# Patient Record
Sex: Female | Born: 1950
Health system: Southern US, Community
[De-identification: ages and names within clinical notes are randomized; demographics above are authoritative.]

## PROBLEM LIST (undated history)

## (undated) DIAGNOSIS — M1990 Unspecified osteoarthritis, unspecified site: Secondary | ICD-10-CM

## (undated) DIAGNOSIS — C3490 Malignant neoplasm of unspecified part of unspecified bronchus or lung: Secondary | ICD-10-CM

## (undated) HISTORY — DX: Unspecified osteoarthritis, unspecified site: M19.90

## (undated) MED FILL — Trilaciclib Dihydrochloride For IV Soln 300 MG: INTRAVENOUS | Qty: 24 | Status: AC

---

## 1998-06-12 ENCOUNTER — Other Ambulatory Visit: Admission: RE | Admit: 1998-06-12 | Discharge: 1998-06-12 | Payer: Self-pay | Admitting: Obstetrics and Gynecology

## 1999-07-02 ENCOUNTER — Other Ambulatory Visit: Admission: RE | Admit: 1999-07-02 | Discharge: 1999-07-02 | Payer: Self-pay | Admitting: Obstetrics and Gynecology

## 1999-07-18 ENCOUNTER — Encounter: Payer: Self-pay | Admitting: Obstetrics and Gynecology

## 1999-07-18 ENCOUNTER — Encounter: Admission: RE | Admit: 1999-07-18 | Discharge: 1999-07-18 | Payer: Self-pay | Admitting: Obstetrics and Gynecology

## 2000-07-07 ENCOUNTER — Other Ambulatory Visit: Admission: RE | Admit: 2000-07-07 | Discharge: 2000-07-07 | Payer: Self-pay | Admitting: Obstetrics and Gynecology

## 2000-07-30 ENCOUNTER — Encounter: Admission: RE | Admit: 2000-07-30 | Discharge: 2000-07-30 | Payer: Self-pay | Admitting: Obstetrics and Gynecology

## 2000-07-30 ENCOUNTER — Encounter: Payer: Self-pay | Admitting: Obstetrics and Gynecology

## 2001-02-11 ENCOUNTER — Encounter: Admission: RE | Admit: 2001-02-11 | Discharge: 2001-02-11 | Payer: Self-pay | Admitting: *Deleted

## 2001-02-11 ENCOUNTER — Encounter: Payer: Self-pay | Admitting: *Deleted

## 2001-08-11 ENCOUNTER — Encounter: Payer: Self-pay | Admitting: Obstetrics and Gynecology

## 2001-08-11 ENCOUNTER — Encounter: Admission: RE | Admit: 2001-08-11 | Discharge: 2001-08-11 | Payer: Self-pay | Admitting: Obstetrics and Gynecology

## 2002-08-13 ENCOUNTER — Encounter: Admission: RE | Admit: 2002-08-13 | Discharge: 2002-08-13 | Payer: Self-pay | Admitting: Obstetrics and Gynecology

## 2002-08-13 ENCOUNTER — Encounter: Payer: Self-pay | Admitting: Obstetrics and Gynecology

## 2003-08-17 ENCOUNTER — Ambulatory Visit (HOSPITAL_COMMUNITY): Admission: RE | Admit: 2003-08-17 | Discharge: 2003-08-17 | Payer: Self-pay | Admitting: Obstetrics and Gynecology

## 2004-08-22 ENCOUNTER — Ambulatory Visit (HOSPITAL_COMMUNITY): Admission: RE | Admit: 2004-08-22 | Discharge: 2004-08-22 | Payer: Self-pay | Admitting: Obstetrics and Gynecology

## 2005-08-27 ENCOUNTER — Ambulatory Visit (HOSPITAL_COMMUNITY): Admission: RE | Admit: 2005-08-27 | Discharge: 2005-08-27 | Payer: Self-pay | Admitting: Family Medicine

## 2006-09-22 ENCOUNTER — Encounter: Admission: RE | Admit: 2006-09-22 | Discharge: 2006-09-22 | Payer: Self-pay | Admitting: Family Medicine

## 2006-10-08 ENCOUNTER — Encounter (INDEPENDENT_AMBULATORY_CARE_PROVIDER_SITE_OTHER): Payer: Self-pay | Admitting: *Deleted

## 2007-05-20 ENCOUNTER — Encounter: Payer: Self-pay | Admitting: Family Medicine

## 2007-10-12 ENCOUNTER — Encounter: Admission: RE | Admit: 2007-10-12 | Discharge: 2007-10-12 | Payer: Self-pay | Admitting: Obstetrics and Gynecology

## 2016-03-18 ENCOUNTER — Encounter: Payer: Self-pay | Admitting: Podiatry

## 2016-03-18 ENCOUNTER — Ambulatory Visit (INDEPENDENT_AMBULATORY_CARE_PROVIDER_SITE_OTHER): Payer: 59 | Admitting: Podiatry

## 2016-03-18 DIAGNOSIS — M2042 Other hammer toe(s) (acquired), left foot: Secondary | ICD-10-CM

## 2016-03-18 DIAGNOSIS — L851 Acquired keratosis [keratoderma] palmaris et plantaris: Secondary | ICD-10-CM | POA: Diagnosis not present

## 2016-03-18 DIAGNOSIS — M79672 Pain in left foot: Secondary | ICD-10-CM | POA: Diagnosis not present

## 2016-03-18 DIAGNOSIS — L84 Corns and callosities: Secondary | ICD-10-CM

## 2016-03-18 DIAGNOSIS — M79671 Pain in right foot: Secondary | ICD-10-CM | POA: Diagnosis not present

## 2016-03-18 DIAGNOSIS — M2041 Other hammer toe(s) (acquired), right foot: Secondary | ICD-10-CM

## 2016-03-18 NOTE — Progress Notes (Signed)
Subjective: Patient presents to the office today for chief complaint of painful callus lesions of the feet. Patient states that the pain is ongoing and is affecting their ability to ambulate without pain. Patient presents today for further treatment and evaluation.  Objective:  Physical Exam General: Alert and oriented x3 in no acute distress  Dermatology: Hyperkeratotic lesion present on the weightbearing surface of the third MPJ right foot. Pain on palpation with a central nucleated core noted.  Skin is warm, dry and supple bilateral lower extremities. Negative for open lesions or macerations.  Vascular: Palpable pedal pulses bilaterally. No edema or erythema noted. Capillary refill within normal limits.  Neurological: Epicritic and protective threshold grossly intact bilaterally.   Musculoskeletal Exam: Second digit toe amputation noted which appears to be healed. Hammertoe contracture digits 2-5 bilateral with exception of toe amputation noted. Pain on palpation at the keratotic lesion noted. Range of motion within normal limits bilateral. Muscle strength 5/5 in all groups bilateral.  Assessment: #1 porokeratosis sub-third MPJ right foot #2 history of second digit toe amputation right foot #3 hammertoe deformities digits 2-5 bilateral with exception of toe amputation noted #3 pain in right foot   Plan of Care:  #1 Patient evaluated #2 Excisional debridement of  keratoic lesion using a chisel blade was performed without incident.  #3 Treated area(s) with Salinocaine and dressed with light dressing. #4 Patient is to return to the clinic PRN.   Edrick Kins, [place]

## 2018-05-30 ENCOUNTER — Encounter (HOSPITAL_COMMUNITY): Payer: Self-pay | Admitting: Emergency Medicine

## 2018-05-30 ENCOUNTER — Emergency Department (HOSPITAL_COMMUNITY): Payer: No Typology Code available for payment source

## 2018-05-30 ENCOUNTER — Emergency Department (HOSPITAL_COMMUNITY)
Admission: EM | Admit: 2018-05-30 | Discharge: 2018-05-30 | Disposition: A | Discharge status: Home / Self Care | Payer: No Typology Code available for payment source | Attending: Emergency Medicine | Admitting: Emergency Medicine

## 2018-05-30 DIAGNOSIS — F17210 Nicotine dependence, cigarettes, uncomplicated: Secondary | ICD-10-CM | POA: Insufficient documentation

## 2018-05-30 DIAGNOSIS — S299XXA Unspecified injury of thorax, initial encounter: Secondary | ICD-10-CM | POA: Diagnosis not present

## 2018-05-30 DIAGNOSIS — R0789 Other chest pain: Secondary | ICD-10-CM | POA: Diagnosis not present

## 2018-05-30 DIAGNOSIS — R0781 Pleurodynia: Secondary | ICD-10-CM

## 2018-05-30 DIAGNOSIS — R52 Pain, unspecified: Secondary | ICD-10-CM | POA: Diagnosis not present

## 2018-05-30 DIAGNOSIS — Y999 Unspecified external cause status: Secondary | ICD-10-CM | POA: Diagnosis not present

## 2018-05-30 DIAGNOSIS — I10 Essential (primary) hypertension: Secondary | ICD-10-CM | POA: Diagnosis not present

## 2018-05-30 DIAGNOSIS — R100 Acute abdomen: Secondary | ICD-10-CM | POA: Diagnosis not present

## 2018-05-30 DIAGNOSIS — Y92410 Unspecified street and highway as the place of occurrence of the external cause: Secondary | ICD-10-CM | POA: Insufficient documentation

## 2018-05-30 DIAGNOSIS — R69 Illness, unspecified: Secondary | ICD-10-CM | POA: Diagnosis not present

## 2018-05-30 DIAGNOSIS — Y9389 Activity, other specified: Secondary | ICD-10-CM | POA: Insufficient documentation
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
## 2018-05-30 NOTE — ED Notes (Signed)
MD at bedside. 

## 2018-05-30 NOTE — ED Notes (Signed)
Pt to bathroom
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
## 2018-12-23 ENCOUNTER — Ambulatory Visit (INDEPENDENT_AMBULATORY_CARE_PROVIDER_SITE_OTHER): Payer: Medicare HMO | Admitting: Family Medicine

## 2018-12-23 ENCOUNTER — Other Ambulatory Visit: Payer: Self-pay

## 2018-12-23 ENCOUNTER — Encounter: Payer: Self-pay | Admitting: Family Medicine

## 2018-12-23 ENCOUNTER — Ambulatory Visit (INDEPENDENT_AMBULATORY_CARE_PROVIDER_SITE_OTHER): Payer: Medicare HMO

## 2018-12-23 VITALS — BP 136/82 | HR 93 | Temp 95.1°F | Resp 18 | Ht 62.5 in | Wt 141.4 lb

## 2018-12-23 DIAGNOSIS — G8929 Other chronic pain: Secondary | ICD-10-CM

## 2018-12-23 DIAGNOSIS — Z13220 Encounter for screening for lipoid disorders: Secondary | ICD-10-CM

## 2018-12-23 DIAGNOSIS — M25562 Pain in left knee: Secondary | ICD-10-CM

## 2018-12-23 DIAGNOSIS — M1712 Unilateral primary osteoarthritis, left knee: Secondary | ICD-10-CM | POA: Diagnosis not present

## 2018-12-23 DIAGNOSIS — Z1329 Encounter for screening for other suspected endocrine disorder: Secondary | ICD-10-CM

## 2018-12-23 DIAGNOSIS — Z8261 Family history of arthritis: Secondary | ICD-10-CM | POA: Diagnosis not present

## 2018-12-23 LAB — COMPREHENSIVE METABOLIC PANEL
ALT: 11 U/L (ref 0–35)
AST: 22 U/L (ref 0–37)
Albumin: 4 g/dL (ref 3.5–5.2)
Alkaline Phosphatase: 101 U/L (ref 39–117)
BUN: 8 mg/dL (ref 6–23)
CO2: 31 mEq/L (ref 19–32)
Calcium: 9.3 mg/dL (ref 8.4–10.5)
Chloride: 103 mEq/L (ref 96–112)
Creatinine, Ser: 0.6 mg/dL (ref 0.40–1.20)
GFR: 100.1 mL/min (ref >60.00)
Glucose, Bld: 97 mg/dL (ref 70–99)
Potassium: 4.4 mEq/L (ref 3.5–5.1)
Sodium: 140 mEq/L (ref 135–145)
Total Bilirubin: 0.4 mg/dL (ref 0.2–1.2)
Total Protein: 6.1 g/dL (ref 6.0–8.3)

## 2018-12-23 LAB — CBC
HCT: 42.9 % (ref 36.0–46.0)
Hemoglobin: 14.4 g/dL (ref 12.0–15.0)
MCHC: 33.4 g/dL (ref 30.0–36.0)
MCV: 99.3 fl (ref 78.0–100.0)
Platelets: 282 10*3/uL (ref 150.0–400.0)
RBC: 4.33 Mil/uL (ref 3.87–5.11)
RDW: 13.4 % (ref 11.5–15.5)
WBC: 5.8 10*3/uL (ref 4.0–10.5)

## 2018-12-23 LAB — TSH: TSH: 0.98 u[IU]/mL (ref 0.35–4.50)

## 2018-12-23 LAB — LIPID PANEL
Cholesterol: 171 mg/dL (ref 0–200)
HDL: 63 mg/dL (ref >39.00)
LDL Cholesterol: 76 mg/dL (ref 0–99)
NonHDL: 107.61
Total CHOL/HDL Ratio: 3
Triglycerides: 157 mg/dL — ABNORMAL HIGH (ref 0.0–149.0)
VLDL: 31.4 mg/dL (ref 0.0–40.0)

## 2018-12-23 NOTE — Progress Notes (Signed)
Subjective:    Patient ID: Christy Kelley, female    DOB: 1950-10-12, 68 y.o.   MRN: [id number]  HPI   Patient presents to clinic to establish with PCP.  Main concern today is pain of left knee.  Patient does report a family history of rheumatoid arthritis, she also was a runner.  Currently use topical pain patches and rubs like BenGay, Voltaren gel and Biofreeze with some effect in helping pain.  Patient wondering if she will need a knee replacement.  Patient has never had colonoscopy, refuses to do this.  Also refuses to do Cologuard.  Reviewed with patient risks of not doing the screenings exams; verbalizes understanding of these risks including undetected cancer and death.  Patient's last mammogram was in the late 1990s and ended up needing a biopsy.  States the biopsy gave her a big scar and ended up being negative.  States she refuses to have any more mammograms since that experience.  Verbalizes understanding risk of not doing mammograms including undetected cancers and death.  Does not take any medications regularly, will use Tylenol or ibuprofen on occasion.  Past medical, social, surgical and family history reviewed and updated accordingly in chart.  Patient Active Problem List   Diagnosis Date Noted  . Chronic pain of left knee 12/23/2018  . Family history of rheumatoid arthritis 12/23/2018   Social History   Tobacco Use  . Smoking status: Former Smoker    Types: Cigarettes  . Smokeless tobacco: Never Used  Substance Use Topics  . Alcohol use: Never    Frequency: Never   History reviewed. No pertinent surgical history.  Family History  Problem Relation Age of Onset  . Arthritis/Rheumatoid Mother   . Diabetes Father    Review of Systems  Constitutional: Negative for chills, fatigue and fever.  HENT: Negative for congestion, ear pain, sinus pain and sore throat.   Eyes: Negative.   Respiratory: Negative for cough, shortness of breath and wheezing.    Cardiovascular: Negative for chest pain, palpitations and leg swelling.  Gastrointestinal: Negative for abdominal pain, diarrhea, nausea and vomiting.  Genitourinary: Negative for dysuria, frequency and urgency.  Musculoskeletal: +left knee pain Skin: Negative for color change, pallor and rash.  Neurological: Negative for syncope, light-headedness and headaches.  Psychiatric/Behavioral: The patient is not nervous/anxious.       Objective:   Physical Exam Vitals signs and nursing note reviewed.  Constitutional:      General: She is not in acute distress.    Appearance: She is not ill-appearing, toxic-appearing or diaphoretic.  HENT:     Head: Normocephalic and atraumatic.  Eyes:     General: No scleral icterus.    Extraocular Movements: Extraocular movements intact.     Conjunctiva/sclera: Conjunctivae normal.     Pupils: Pupils are equal, round, and reactive to light.  Cardiovascular:     Rate and Rhythm: Normal rate and regular rhythm.     Heart sounds: Normal heart sounds.  Pulmonary:     Effort: Pulmonary effort is normal. No respiratory distress.     Breath sounds: Normal breath sounds.  Musculoskeletal:     Right lower leg: No edema.     Left lower leg: No edema.     Comments: Left knee tenderness, pain in joint and extreme limits of range.  Negative anterior posterior drawer test.  When patient walks, she does appear a little "knock kneed".  Suspect she has severe OA/RA and may end up needing a knee replacement.  Patient's knuckles on both hands are nodular, this leads me to suspect she also has rheumatoid arthritis.  Skin:    General: Skin is warm and dry.     Coloration: Skin is not jaundiced or pale.  Neurological:     Mental Status: She is alert and oriented to person, place, and time.     Comments: Walks with some limp due to pain in left knee  Psychiatric:        Mood and Affect: Mood normal.        Behavior: Behavior normal.    Today's Vitals   12/23/18  0947  BP: 136/82  Pulse: 93  Resp: 18  Temp: (!) 95.1 F (35.1 C)  TempSrc: Temporal  SpO2: 96%  Weight: 141 lb 6.4 oz (64.1 kg)  Height: 5' 2.5" (1.588 m)   Body mass index is 25.45 kg/m.    Assessment & Plan:    Family history of rheumatoid arthritis- discussed with patient that due to her physical exam and also her family history I do suspect she does have rheumatoid arthritis.  Declines having rheumatoid factor or ANA drawn in her lab work.  States "I will not take any medicine anyway, so is not worth drawing the test"  Chronic knee pain-suspect this is related to his severe OA and/or RA.  We will get x-ray in clinic.  Advised she can continue to use topical rubs for pain reduction as well as tylenol or ibuprofen PRN  Lipid screening, thyroid screening-we will include thyroid panel and lipid panel and CBC and CMP.  Declines flu vaccine  Patient will follow-up annually for wellness exam, otherwise she will call us needs arise.

## 2018-12-24 ENCOUNTER — Other Ambulatory Visit: Payer: Self-pay | Admitting: Family Medicine

## 2018-12-24 DIAGNOSIS — G8929 Other chronic pain: Secondary | ICD-10-CM

## 2018-12-24 DIAGNOSIS — M1712 Unilateral primary osteoarthritis, left knee: Secondary | ICD-10-CM

## 2019-01-12 ENCOUNTER — Encounter: Payer: Self-pay | Admitting: Orthopaedic Surgery

## 2019-01-12 ENCOUNTER — Ambulatory Visit (INDEPENDENT_AMBULATORY_CARE_PROVIDER_SITE_OTHER): Payer: Medicare HMO | Admitting: Orthopaedic Surgery

## 2019-01-12 ENCOUNTER — Other Ambulatory Visit: Payer: Self-pay

## 2019-01-12 VITALS — BP 143/89 | HR 87 | Ht 62.0 in | Wt 140.0 lb

## 2019-01-12 DIAGNOSIS — M25562 Pain in left knee: Secondary | ICD-10-CM

## 2019-01-12 DIAGNOSIS — G8929 Other chronic pain: Secondary | ICD-10-CM

## 2019-01-12 NOTE — Progress Notes (Signed)
Office Visit Note   Patient: Christy Kelley           Date of Birth: [birth year]           MRN: [id number] Visit Date: 01/12/2019              Requested by: Jodelle Green, FNP [address] [place],  [place] [address] PCP: Jodelle Green, FNP   Assessment & Plan: Visit Diagnoses:  1. Chronic pain of left knee     Plan: End-stage osteoarthritis left knee with valgus deformity of approximately 20 degrees.  Long discussion regarding diagnosis and treatment options.  Christy Kelley Did not want to pursue a surgical option at this point so we discussed nonoperative treatment including bracing, medicines, injections.  Will let us know how she wants to proceed over time Follow-Up Instructions: Return if symptoms worsen or fail to improve.   Orders:  No orders of the defined types were placed in this encounter.  No orders of the defined types were placed in this encounter.     Procedures: No procedures performed   Clinical Data: No additional findings.   Subjective: Chief Complaint  Patient presents with  . Left Knee - Pain  Patient presents today for left knee pain X25months. No known injury. Her pain is located all throughout. Her pain is pretty consistent, but worsens with stairs.  She has noticed that it swells. She saw her PCP on 12/23/2018 and had x-rays taken.  Films demonstrate bone-on-bone in the lateral compartment with about 20 degrees of valgus.  No acute changes. Presently doing relatively well.  Does have some compromise of back activities but not to the point where she wants to consider surgery.  HPI  Review of Systems   Objective: Vital Signs: BP (!) 143/89   Pulse 87   Ht 5\' 2"  (1.575 m)   Wt 140 lb (63.5 kg)   LMP  (LMP Unknown)   BMI 25.61 kg/m   Physical Exam Constitutional:      Appearance: She is well-developed.  Eyes:     Pupils: Pupils are equal, round, and reactive to light.  Pulmonary:     Effort: Pulmonary effort is normal.   Skin:    General: Skin is warm and dry.  Neurological:     Mental Status: She is alert and oriented to person, place, and time.  Psychiatric:        Behavior: Behavior normal.     Ortho Exam left knee with considerable valgus with weightbearing.  I could not fully correct to neutral.  Small effusion.  Predominant lateral joint pain.  Opens a little medially as expected with a valgus stress.  Negative anterior drawer sign.  No pain with range of motion of right hip.  Straight leg raise negative.  Neurologically intact Specialty Comments:  No specialty comments available.  Imaging: No results found.   PMFS History: Patient Active Problem List   Diagnosis Date Noted  . Chronic pain of left knee 12/23/2018  . Family history of rheumatoid arthritis 12/23/2018   History reviewed. No pertinent past medical history.  Family History  Problem Relation Age of Onset  . Arthritis/Rheumatoid Mother   . Diabetes Father     History reviewed. No pertinent surgical history. Social History   Occupational History  . Not on file  Tobacco Use  . Smoking status: Former Smoker    Types: Cigarettes  . Smokeless tobacco: Never Used  Substance and Sexual Activity  .  Alcohol use: Never    Frequency: Never  . Drug use: Never  . Sexual activity: Not on file

## 2019-05-20 ENCOUNTER — Ambulatory Visit: Payer: Medicare HMO

## 2019-05-27 ENCOUNTER — Ambulatory Visit: Payer: Medicare HMO | Attending: Internal Medicine

## 2019-05-27 DIAGNOSIS — Z23 Encounter for immunization: Secondary | ICD-10-CM | POA: Insufficient documentation

## 2019-05-27 NOTE — Progress Notes (Signed)
   Covid-19 Vaccination Clinic  Name:  Christy Kelley    MRN: [id number] DOB: 06-01-1950  05/27/2019  [name] was observed post Covid-19 immunization for 15 minutes without incidence. She was provided with Vaccine Information Sheet and instruction to access the V-Safe system.   [name] was instructed to call 911 with any severe reactions post vaccine: Marland Kitchen Difficulty breathing  . Swelling of your face and throat  . A fast heartbeat  . A bad rash all over your body  . Dizziness and weakness    Immunizations Administered    Name Date Dose VIS Date Route   Pfizer COVID-19 Vaccine 05/27/2019  9:08 AM 0.3 mL 04/02/2019 Intramuscular   Manufacturer: [place]   Lot: [id number]   [place]: [id number]

## 2019-06-09 DIAGNOSIS — R69 Illness, unspecified: Secondary | ICD-10-CM | POA: Diagnosis not present

## 2019-06-10 ENCOUNTER — Ambulatory Visit: Payer: Medicare HMO

## 2019-06-21 ENCOUNTER — Ambulatory Visit: Payer: Medicare HMO | Attending: Internal Medicine

## 2019-06-21 ENCOUNTER — Other Ambulatory Visit: Payer: Self-pay

## 2019-06-21 DIAGNOSIS — Z23 Encounter for immunization: Secondary | ICD-10-CM | POA: Insufficient documentation

## 2019-06-21 NOTE — Progress Notes (Signed)
   Covid-19 Vaccination Clinic  Name:  Christy Kelley    MRN: [id number] DOB: August 02, 1950  06/21/2019  [name] was observed post Covid-19 immunization for 15 minutes without incidence. She was provided with Vaccine Information Sheet and instruction to access the V-Safe system.   [name] was instructed to call 911 with any severe reactions post vaccine: Marland Kitchen Difficulty breathing  . Swelling of your face and throat  . A fast heartbeat  . A bad rash all over your body  . Dizziness and weakness    Immunizations Administered    Name Date Dose VIS Date Route   Pfizer COVID-19 Vaccine 06/21/2019 11:09 AM 0.3 mL 04/02/2019 Intramuscular   Manufacturer: [place]   Lot: [id number]   [place]: [id number]

## 2020-02-18 ENCOUNTER — Encounter: Payer: Self-pay | Admitting: Family Medicine

## 2020-02-18 ENCOUNTER — Other Ambulatory Visit: Payer: Self-pay

## 2020-02-18 ENCOUNTER — Ambulatory Visit (INDEPENDENT_AMBULATORY_CARE_PROVIDER_SITE_OTHER): Payer: Medicare HMO | Admitting: Family Medicine

## 2020-02-18 VITALS — BP 128/72 | HR 96 | Temp 97.7°F | Ht 62.5 in | Wt 131.0 lb

## 2020-02-18 DIAGNOSIS — Z5320 Procedure and treatment not carried out because of patient's decision for unspecified reasons: Secondary | ICD-10-CM

## 2020-02-18 DIAGNOSIS — G8929 Other chronic pain: Secondary | ICD-10-CM

## 2020-02-18 DIAGNOSIS — M25562 Pain in left knee: Secondary | ICD-10-CM | POA: Diagnosis not present

## 2020-02-18 DIAGNOSIS — Z7689 Persons encountering health services in other specified circumstances: Secondary | ICD-10-CM

## 2020-02-18 DIAGNOSIS — Z2821 Immunization not carried out because of patient refusal: Secondary | ICD-10-CM

## 2020-02-18 NOTE — Patient Instructions (Signed)
Consider getting Shingles vaccine

## 2020-02-18 NOTE — Progress Notes (Signed)
° °  Subjective:    Patient ID: Christy Kelley, female    DOB: 1950-11-15, 69 y.o.   MRN: [id number]  HPI Chief Complaint  Patient presents with   New Patient (Initial Visit)   This is a 69 yo female who presents today to establish care. Prior patient of Philis Nettle, NP. Married, has 2 grown children, 3 grandkids, retired Futures trader, she has a Pharmacologist about her daily life with some history.   Last CPE- many years ago Mammo- not interested in screening Pap- not interested Colonoscopy- never, did not return cologuard Tdap- unsure Flu- not usually Covid 19 vaccine- fully vaccinated Eye- unsure Dental- regular Exercise- walks dog     Review of Systems Denies headaches, visual changes, chest pain, SOB, cough, wheeze, abdominal pain, diarrhea/ constipation, dysuria, hematuria, urinary frequency, only joint pain is chronic knee pain. Has seen ortho.     Objective:   Physical Exam Physical Exam  Constitutional: Oriented to person, place, and time. Appears well-developed and well-nourished.  HENT:  Head: Normocephalic and atraumatic.  Eyes: Conjunctivae are normal.  Neck: Normal range of motion. Neck supple.  Cardiovascular: Normal rate, regular rhythm and normal heart sounds.   Pulmonary/Chest: Effort normal and breath sounds normal.  Musculoskeletal: No lower extremity edema.   Neurological: Alert and oriented to person, place, and time.  Skin: Skin is warm and dry.  Psychiatric: Normal mood and affect. Behavior is normal. Judgment and thought content normal.  Vitals reviewed.        BP 128/72    Pulse 96    Temp 97.7 F (36.5 C) (Temporal)    Ht 5' 2.5" (1.588 m)    Wt 131 lb (59.4 kg)    LMP  (LMP Unknown)    SpO2 97%    BMI 23.58 kg/m  Wt Readings from Last 3 Encounters:  02/18/20 131 lb (59.4 kg)  01/12/19 140 lb (63.5 kg)  12/23/18 141 lb 6.4 oz (64.1 kg)    Assessment & Plan:  1. Encounter to establish care - reviewed EMR, overdue health maintenance,  discussed colon cancer screening, mammogram, vaccinations. She declines at this time. Advised her that screenings can be performed at any time and importance of early detection of disease.  - discussed lab findings from last year, all normal, ok to do every other year labs  2. Chronic pain of left knee - some improvement, able to do all ADLs, activities as desired  3. Influenza vaccination declined  4. Mammogram declined  5. Colon cancer screening declined  6. Pneumococcal vaccination declined  This visit occurred during the SARS-CoV-2 public health emergency.  Safety protocols were in place, including screening questions prior to the visit, additional usage of staff PPE, and extensive cleaning of exam room while observing appropriate contact time as indicated for disinfecting solutions.    Clarene Reamer, FNP-BC  [facility]  02/20/2020 8:04 AM

## 2020-07-21 DIAGNOSIS — Z87891 Personal history of nicotine dependence: Secondary | ICD-10-CM | POA: Diagnosis not present

## 2020-07-21 DIAGNOSIS — Z809 Family history of malignant neoplasm, unspecified: Secondary | ICD-10-CM | POA: Diagnosis not present

## 2020-07-21 DIAGNOSIS — R030 Elevated blood-pressure reading, without diagnosis of hypertension: Secondary | ICD-10-CM | POA: Diagnosis not present

## 2020-07-21 DIAGNOSIS — Z89422 Acquired absence of other left toe(s): Secondary | ICD-10-CM | POA: Diagnosis not present

## 2020-07-21 DIAGNOSIS — M1990 Unspecified osteoarthritis, unspecified site: Secondary | ICD-10-CM | POA: Diagnosis not present

## 2020-07-21 DIAGNOSIS — Z008 Encounter for other general examination: Secondary | ICD-10-CM | POA: Diagnosis not present

## 2020-07-21 DIAGNOSIS — G8929 Other chronic pain: Secondary | ICD-10-CM | POA: Diagnosis not present

## 2020-07-21 DIAGNOSIS — Z791 Long term (current) use of non-steroidal anti-inflammatories (NSAID): Secondary | ICD-10-CM | POA: Diagnosis not present

## 2020-07-27 ENCOUNTER — Emergency Department (HOSPITAL_COMMUNITY): Payer: Medicare HMO

## 2020-07-27 ENCOUNTER — Other Ambulatory Visit: Payer: Self-pay

## 2020-07-27 ENCOUNTER — Emergency Department (HOSPITAL_COMMUNITY)
Admission: EM | Admit: 2020-07-27 | Discharge: 2020-07-28 | Disposition: A | Discharge status: Home / Self Care | Payer: Medicare HMO | Attending: Emergency Medicine | Admitting: Emergency Medicine

## 2020-07-27 ENCOUNTER — Encounter (HOSPITAL_COMMUNITY): Payer: Self-pay

## 2020-07-27 DIAGNOSIS — J189 Pneumonia, unspecified organism: Secondary | ICD-10-CM | POA: Diagnosis not present

## 2020-07-27 DIAGNOSIS — R509 Fever, unspecified: Secondary | ICD-10-CM | POA: Diagnosis not present

## 2020-07-27 DIAGNOSIS — J918 Pleural effusion in other conditions classified elsewhere: Secondary | ICD-10-CM | POA: Insufficient documentation

## 2020-07-27 DIAGNOSIS — R000 Tachycardia, unspecified: Secondary | ICD-10-CM | POA: Diagnosis not present

## 2020-07-27 DIAGNOSIS — J90 Pleural effusion, not elsewhere classified: Secondary | ICD-10-CM | POA: Diagnosis not present

## 2020-07-27 DIAGNOSIS — H538 Other visual disturbances: Secondary | ICD-10-CM | POA: Diagnosis not present

## 2020-07-27 DIAGNOSIS — J1282 Pneumonia due to coronavirus disease 2019: Secondary | ICD-10-CM | POA: Diagnosis not present

## 2020-07-27 DIAGNOSIS — M47814 Spondylosis without myelopathy or radiculopathy, thoracic region: Secondary | ICD-10-CM | POA: Diagnosis not present

## 2020-07-27 DIAGNOSIS — I2510 Atherosclerotic heart disease of native coronary artery without angina pectoris: Secondary | ICD-10-CM | POA: Diagnosis not present

## 2020-07-27 DIAGNOSIS — U071 COVID-19: Secondary | ICD-10-CM | POA: Insufficient documentation

## 2020-07-27 DIAGNOSIS — R590 Localized enlarged lymph nodes: Secondary | ICD-10-CM | POA: Insufficient documentation

## 2020-07-27 DIAGNOSIS — R4182 Altered mental status, unspecified: Secondary | ICD-10-CM | POA: Diagnosis not present

## 2020-07-27 DIAGNOSIS — F17210 Nicotine dependence, cigarettes, uncomplicated: Secondary | ICD-10-CM | POA: Insufficient documentation

## 2020-07-27 DIAGNOSIS — I700 Atherosclerosis of aorta: Secondary | ICD-10-CM | POA: Insufficient documentation

## 2020-07-27 DIAGNOSIS — R69 Illness, unspecified: Secondary | ICD-10-CM | POA: Diagnosis not present

## 2020-07-27 LAB — URINALYSIS, ROUTINE W REFLEX MICROSCOPIC
Bacteria, UA: NONE SEEN
Bilirubin Urine: NEGATIVE
Glucose, UA: NEGATIVE mg/dL
Ketones, ur: NEGATIVE mg/dL
Leukocytes,Ua: NEGATIVE
Nitrite: NEGATIVE
Protein, ur: NEGATIVE mg/dL
Specific Gravity, Urine: 1 — ABNORMAL LOW (ref 1.005–1.030)
pH: 6 (ref 5.0–8.0)

## 2020-07-27 LAB — COMPREHENSIVE METABOLIC PANEL
ALT: 20 U/L (ref 0–44)
AST: 36 U/L (ref 15–41)
Albumin: 3.6 g/dL (ref 3.5–5.0)
Alkaline Phosphatase: 93 U/L (ref 38–126)
Anion gap: 10 (ref 5–15)
BUN: 24 mg/dL — ABNORMAL HIGH (ref 8–23)
CO2: 24 mmol/L (ref 22–32)
Calcium: 8.9 mg/dL (ref 8.9–10.3)
Chloride: 103 mmol/L (ref 98–111)
Creatinine, Ser: 0.78 mg/dL (ref 0.44–1.00)
GFR, Estimated: >60 mL/min (ref >60)
Glucose, Bld: 145 mg/dL — ABNORMAL HIGH (ref 70–99)
Potassium: 3.5 mmol/L (ref 3.5–5.1)
Sodium: 137 mmol/L (ref 135–145)
Total Bilirubin: 0.7 mg/dL (ref 0.3–1.2)
Total Protein: 7.1 g/dL (ref 6.5–8.1)

## 2020-07-27 LAB — CBC WITH DIFFERENTIAL/PLATELET
Abs Immature Granulocytes: 0.06 10*3/uL (ref 0.00–0.07)
Basophils Absolute: 0 10*3/uL (ref 0.0–0.1)
Basophils Relative: 0 %
Eosinophils Absolute: 0 10*3/uL (ref 0.0–0.5)
Eosinophils Relative: 0 %
HCT: 37.3 % (ref 36.0–46.0)
Hemoglobin: 12.4 g/dL (ref 12.0–15.0)
Immature Granulocytes: 1 %
Lymphocytes Relative: 3 %
Lymphs Abs: 0.4 10*3/uL — ABNORMAL LOW (ref 0.7–4.0)
MCH: 32.7 pg (ref 26.0–34.0)
MCHC: 33.2 g/dL (ref 30.0–36.0)
MCV: 98.4 fL (ref 80.0–100.0)
Monocytes Absolute: 0.3 10*3/uL (ref 0.1–1.0)
Monocytes Relative: 3 %
Neutro Abs: 11.3 10*3/uL — ABNORMAL HIGH (ref 1.7–7.7)
Neutrophils Relative %: 93 %
Platelets: 227 10*3/uL (ref 150–400)
RBC: 3.79 MIL/uL — ABNORMAL LOW (ref 3.87–5.11)
RDW: 13.4 % (ref 11.5–15.5)
WBC: 12.1 10*3/uL — ABNORMAL HIGH (ref 4.0–10.5)
nRBC: 0 % (ref 0.0–0.2)

## 2020-07-27 LAB — RESP PANEL BY RT-PCR (FLU A&B, COVID) ARPGX2
Influenza A by PCR: NEGATIVE
Influenza B by PCR: NEGATIVE
SARS Coronavirus 2 by RT PCR: POSITIVE — AB

## 2020-07-27 LAB — LACTIC ACID, PLASMA
Lactic Acid, Venous: 1.2 mmol/L (ref 0.5–1.9)
Lactic Acid, Venous: 0.9 mmol/L (ref 0.5–1.9)

## 2020-07-27 MED ORDER — LACTATED RINGERS IV BOLUS
1000.0000 mL | Freq: Once | INTRAVENOUS | Status: AC
  Administered 2020-07-27: 1000 mL via INTRAVENOUS

## 2020-07-27 MED ORDER — SODIUM CHLORIDE 0.9 % IV SOLN
1.0000 g | Freq: Once | INTRAVENOUS | Status: AC
  Administered 2020-07-27: 1 g via INTRAVENOUS
  Filled 2020-07-27: qty 10

## 2020-07-27 MED ORDER — IOHEXOL 300 MG/ML  SOLN
75.0000 mL | Freq: Once | INTRAMUSCULAR | Status: AC | PRN
  Administered 2020-07-27: 75 mL via INTRAVENOUS

## 2020-07-27 MED ORDER — ACETAMINOPHEN 500 MG PO TABS
1000.0000 mg | ORAL_TABLET | Freq: Once | ORAL | Status: AC
Start: 2020-07-27 — End: 2020-07-27
  Administered 2020-07-27: 1000 mg via ORAL
  Filled 2020-07-27: qty 2

## 2020-07-27 MED ORDER — DOXYCYCLINE HYCLATE 100 MG PO TABS
100.0000 mg | ORAL_TABLET | Freq: Once | ORAL | Status: AC
  Administered 2020-07-27: 100 mg via ORAL
  Filled 2020-07-27: qty 1

## 2020-07-27 MED ORDER — DOXYCYCLINE HYCLATE 100 MG PO CAPS: 100.0000 mg | ORAL_CAPSULE | Freq: Two times a day (BID) | ORAL | 0 refills | Status: AC

## 2020-07-27 NOTE — ED Provider Notes (Addendum)
[place] DEPT Provider Note   CSN: [id number] Arrival date & time: 07/27/20  1854     History Chief Complaint  Patient presents with  . Fever    Christy Kelley is a 70 y.o. female.  70 year old female who presents with fever.  Patient states that she has had 3 days of fevers at home up to 100, highest was 101.2 in triage here.  She reports runny nose but no other URI symptoms including no cough, sore throat, chest pain, or shortness of breath.  No vomiting, diarrhea, urinary symptoms, rash, joint swelling, sick contacts, or recent travel.  She does note that over these past few days she has had some confusion and brain fog.  Husband agrees with this.  He states she is not acting herself.  She reports she has had some blurry vision in her left eye for the past week but denies any associated headaches or neck pain.  She was evaluated at urgent care where urine did not show infection and she was sent to the ED for further evaluation.  The history is provided by the patient and the spouse.  Fever      History reviewed. No pertinent past medical history.  Patient Active Problem List   Diagnosis Date Noted  . Chronic pain of left knee 12/23/2018  . Family history of rheumatoid arthritis 12/23/2018    History reviewed. No pertinent surgical history.   OB History   No obstetric history on file.     Family History  Problem Relation Age of Onset  . Arthritis/Rheumatoid Mother   . Diabetes Father   . Cancer Father     Social History   Tobacco Use  . Smoking status: Current Every Day Smoker    Packs/day: 0.25    Types: Cigarettes  . Smokeless tobacco: Never Used  Vaping Use  . Vaping Use: Never used  Substance Use Topics  . Alcohol use: Yes  . Drug use: Never    Home Medications Prior to Admission medications   Medication Sig Start Date End Date Taking? Authorizing Provider  doxycycline (VIBRAMYCIN) 100 MG capsule Take 1 capsule (100  mg total) by mouth 2 (two) times daily for 7 days. 07/27/20 08/03/20 Yes Kyana Aicher, Wenda Overland, MD  meloxicam (MOBIC) 15 MG tablet Take 15 mg by mouth daily. 02/06/20   [provider]    Allergies    Patient has no known allergies.  Review of Systems   Review of Systems  Constitutional: Positive for fever.   All other systems reviewed and are negative except that which was mentioned in HPI  Physical Exam Updated Vital Signs BP 113/77   Pulse 98   Temp (!) 101.2 F (38.4 C) (Oral)   Resp (!) 27   Ht 5\' 2"  (1.575 m)   Wt 59 kg   LMP  (LMP Unknown)   SpO2 96%   BMI 23.78 kg/m   Physical Exam Constitutional:      General: She is not in acute distress.    Appearance: Normal appearance.  HENT:     Head: Normocephalic and atraumatic.     Mouth/Throat:     Mouth: Mucous membranes are moist.     Pharynx: Oropharynx is clear.  Eyes:     Extraocular Movements: Extraocular movements intact.     Conjunctiva/sclera: Conjunctivae normal.     Pupils: Pupils are equal, round, and reactive to light.  Cardiovascular:     Rate and Rhythm: Regular rhythm. Tachycardia present.  Heart sounds: Normal heart sounds. No murmur heard.   Pulmonary:     Effort: Pulmonary effort is normal.     Breath sounds: Normal breath sounds.  Abdominal:     General: Abdomen is flat. Bowel sounds are normal. There is no distension.     Palpations: Abdomen is soft.     Tenderness: There is no abdominal tenderness.  Musculoskeletal:     Right lower leg: No edema.     Left lower leg: No edema.  Skin:    General: Skin is warm and dry.  Neurological:     Mental Status: She is alert and oriented to person, place, and time.     Cranial Nerves: No cranial nerve deficit.     Motor: No weakness.     Comments: fluent  Psychiatric:        Mood and Affect: Mood normal.        Behavior: Behavior normal.     ED Results / Procedures / Treatments   Labs (all labs ordered are listed, but only  abnormal results are displayed) Labs Reviewed  RESP PANEL BY RT-PCR (FLU A&B, COVID) ARPGX2 - Abnormal; Notable for the following components:      Result Value   SARS Coronavirus 2 by RT PCR POSITIVE (*)    All other components within normal limits  COMPREHENSIVE METABOLIC PANEL - Abnormal; Notable for the following components:   Glucose, Bld 145 (*)    BUN 24 (*)    All other components within normal limits  CBC WITH DIFFERENTIAL/PLATELET - Abnormal; Notable for the following components:   WBC 12.1 (*)    RBC 3.79 (*)    Neutro Abs 11.3 (*)    Lymphs Abs 0.4 (*)    All other components within normal limits  URINALYSIS, ROUTINE W REFLEX MICROSCOPIC - Abnormal; Notable for the following components:   Color, Urine COLORLESS (*)    Specific Gravity, Urine 1.000 (*)    Hgb urine dipstick SMALL (*)    All other components within normal limits  CULTURE, BLOOD (ROUTINE X 2)  CULTURE, BLOOD (ROUTINE X 2)  URINE CULTURE  LACTIC ACID, PLASMA  LACTIC ACID, PLASMA    EKG None  Radiology DG Chest 2 View  Result Date: 07/27/2020 CLINICAL DATA:  Fever, change in vision, memory loss. EXAM: CHEST - 2 VIEW COMPARISON:  05/30/2018 FINDINGS: Focal airspace consolidation in the right upper lung likely represents pneumonia. Left lung is clear. Heart size and pulmonary vascularity are normal. Calcified and tortuous aorta. Degenerative changes in the spine and shoulders. IMPRESSION: Right upper lung pneumonia. Electronically Signed   By: Lucienne Capers M.D.   On: 07/27/2020 19:30   CT Head Wo Contrast  Result Date: 07/27/2020 CLINICAL DATA:  Mental status changes EXAM: CT HEAD WITHOUT CONTRAST TECHNIQUE: Contiguous axial images were obtained from the base of the skull through the vertex without intravenous contrast. COMPARISON:  None. FINDINGS: Brain: Mild cerebral volume loss, age appropriate. No acute intracranial abnormality. Specifically, no hemorrhage, hydrocephalus, mass lesion, acute infarction,  or significant intracranial injury. Vascular: No hyperdense vessel or unexpected calcification. Skull: No acute calvarial abnormality. Sinuses/Orbits: Visualized paranasal sinuses and mastoids clear. Orbital soft tissues unremarkable. Other: None IMPRESSION: No acute intracranial abnormality. Electronically Signed   By: Rolm Baptise M.D.   On: 07/27/2020 19:48   CT Chest W Contrast  Result Date: 07/27/2020 CLINICAL DATA:  Fever. EXAM: CT CHEST WITH CONTRAST TECHNIQUE: Multidetector CT imaging of the chest was performed during intravenous  contrast administration. CONTRAST:  3mL OMNIPAQUE IOHEXOL 300 MG/ML  SOLN COMPARISON:  None. FINDINGS: Cardiovascular: There is mild calcification of the aortic arch. No significant vascular findings. Normal heart size. No pericardial effusion. Mediastinum/Nodes: Mild pretracheal and right hilar lymphadenopathy is seen. Thyroid gland, trachea, and esophagus demonstrate no significant findings. Lungs/Pleura: Marked severity infiltrate is seen within the inferior medial aspect of the right upper lobe. There is a small right pleural effusion. No pneumothorax is identified. Upper Abdomen: No acute abnormality. Musculoskeletal: Degenerative changes are noted throughout the thoracic spine. IMPRESSION: 1. Marked severity right upper lobe infiltrate. 2. Small right pleural effusion. 3. Mild pretracheal and right hilar lymphadenopathy, likely reactive. 4. Aortic atherosclerosis. Aortic Atherosclerosis (ICD10-I70.0). Electronically Signed   By: Virgina Norfolk M.D.   On: 07/27/2020 21:20    Procedures Procedures   Medications Ordered in ED Medications  acetaminophen (TYLENOL) tablet 1,000 mg (1,000 mg Oral Given 07/27/20 1956)  lactated ringers bolus 1,000 mL (0 mLs Intravenous Stopped 07/27/20 2213)  iohexol (OMNIPAQUE) 300 MG/ML solution 75 mL (75 mLs Intravenous Contrast Given 07/27/20 2106)  cefTRIAXone (ROCEPHIN) 1 g in sodium chloride 0.9 % 100 mL IVPB (1 g Intravenous New  Bag/Given 07/27/20 2212)  doxycycline (VIBRA-TABS) tablet 100 mg (100 mg Oral Given 07/27/20 2213)    ED Course  I have reviewed the triage vital signs and the nursing notes.  Pertinent labs & imaging results that were available during my care of the patient were reviewed by me and considered in my medical decision making (see chart for details).    MDM Rules/Calculators/A&P                          Pt alert, answering questions on exam.  Denying any complaints of pain.  Aside from reports of confusion, patient with nonfocal neurologic exam.  Temp 101 at triage.  Gave Tylenol and obtained lab work.  Also obtain head CT given reports of confusion.  Head CT negative.  Chest x-ray with a dense infiltrate in right middle lobe, obtain CT for further detail.  CT confirms severe right upper lobe infiltrate with small right pleural effusion.  UA without infection, lactate normal, WBC 12.1, BUN 24, creatinine 0.78.  Gave IV fluid bolus, ceftriaxone, and doxycycline.  Patient is alert, conversant, and comfortable on reassessment.  O2 saturation remains 94 to 95% on room air and she denies complaints.  They feel comfortable treating her pneumonia at home and will follow up with PCP for reassessment.  I have also recommended repeat chest x-ray in 4 weeks to ensure resolution of infection.  I have extensively reviewed return precautions with the patient and her significant other and they voiced understanding.  11:23 PM After patient was discharged, her COVID test came back positive. I spoke w/ patient over the phone regarding test results, need for quarantine, and need to have contacts tested if symptomatic.  Because she has a dense focal consolidation that is not consistent with the usual Covid pneumonia pattern, have recommended that she still complete the antibiotics that I prescribed.  I have placed an ambulatory referral to the Covid treatment center and explained that she will be contacted regarding any  potential outpatient treatment options.  Christy Kelley was evaluated in Emergency Department on 07/27/2020 for the symptoms described in the history of present illness. She was evaluated in the context of the global COVID-19 pandemic, which necessitated consideration that the patient might be at risk for infection with  the SARS-CoV-2 virus that causes COVID-19. Institutional protocols and algorithms that pertain to the evaluation of patients at risk for COVID-19 are in a state of rapid change based on information released by regulatory bodies including the CDC and federal and state organizations. These policies and algorithms were followed during the patient's care in the ED.  Final Clinical Impression(s) / ED Diagnoses Final diagnoses:  Community acquired pneumonia of right upper lobe of lung    Rx / DC Orders ED Discharge Orders         Ordered    doxycycline (VIBRAMYCIN) 100 MG capsule  2 times daily        07/27/20 2230    Ambulatory referral for Covid Treatment        07/27/20 2316           Reni Hausner, Wenda Overland, MD 07/27/20 2228    Damyen Knoll, Wenda Overland, MD 07/27/20 [phone]

## 2020-07-27 NOTE — ED Triage Notes (Signed)
Patient reports that she had a temp today of 100.0 at its highest. T. 101.2 in triage. Patient states she went to an UC and was told she did not have a UTI and was told to come to the ED for further evaluation.

## 2020-07-28 ENCOUNTER — Telehealth: Payer: Self-pay

## 2020-07-28 LAB — BLOOD CULTURE ID PANEL (REFLEXED) - BCID2

## 2020-07-28 LAB — CULTURE, BLOOD (ROUTINE X 2): Special Requests: ADEQUATE

## 2020-07-28 NOTE — Telephone Encounter (Signed)
Called to discuss with patient about COVID-19 symptoms and the use of one of the available treatments for those with mild to moderate Covid symptoms and at a high risk of hospitalization.  Pt appears to qualify for outpatient treatment due to co-morbid conditions and/or a member of an at-risk group in accordance with the FDA Emergency Use Authorization.    Symptom onset: 07/24/20 Fever Vaccinated: Yes Booster? Yes Immunocompromised? No Qualifiers: Yes  Unable to reach pt - Left message and call back number [phone].   Marcello Moores

## 2020-07-29 LAB — URINE CULTURE: Culture: NO GROWTH

## 2020-07-29 LAB — CULTURE, BLOOD (ROUTINE X 2)

## 2020-07-31 ENCOUNTER — Telehealth (INDEPENDENT_AMBULATORY_CARE_PROVIDER_SITE_OTHER): Payer: Medicare HMO | Admitting: Family Medicine

## 2020-07-31 ENCOUNTER — Encounter: Payer: Self-pay | Admitting: Family Medicine

## 2020-07-31 ENCOUNTER — Other Ambulatory Visit: Payer: Self-pay

## 2020-07-31 VITALS — Temp 97.8°F | Ht 62.0 in | Wt 125.0 lb

## 2020-07-31 DIAGNOSIS — J189 Pneumonia, unspecified organism: Secondary | ICD-10-CM

## 2020-07-31 LAB — CULTURE, BLOOD (ROUTINE X 2): Special Requests: ADEQUATE

## 2020-07-31 MED ORDER — AMOXICILLIN 500 MG PO CAPS: 500.0000 mg | ORAL_CAPSULE | Freq: Three times a day (TID) | ORAL | 0 refills | Status: DC

## 2020-07-31 NOTE — Progress Notes (Signed)
Patient is doing better since ED visit. Still has a cough, no appetite and not sleeping much. Patient is still taking doxycycline.

## 2020-07-31 NOTE — Progress Notes (Signed)
Virtual visit completed through WebEx or similar program Patient location: home  Provider location: [place] at [facility], office  Participants: Patient and me (unless stated otherwise below)  Pandemic considerations d/w pt.   Limitations and rationale for visit method d/w patient.  Patient agreed to proceed.   CC:  HPI:  Right upper lung pneumonia.  Previous imaging reviewed and discussed with patient.  ER course discussed with patient.  She was treated with antibiotics in the meantime, doxycycline.  Compliant with medication.  Clearly improving.  No CP, fevers.  Cough is much better.  No sputum.  Not SOB.    Meds and allergies reviewed.   ROS: Per HPI unless specifically indicated in ROS section   NAD Speech wnl  A/P:  Pneumonia. Note from pharmacy at hospital discussed with patient.  "Her ED blood cultures collected on 4/7 have resulted positive in 4/4 bottles for Strep pneumo (sensitive to penicillin - MIC < 0.06). She only received doxy on d/c from the ED. I would recommend treating her with amoxicillin 500mg  TID x 7-10 days. Thank you!" Amoxil rx sent.  Rationale discussed with patient.  She agrees.  She will finish current doxycycline prescription and add on Amoxil. Needs f/u CXR in early May, rationale discussed with patient..  Sx started around 07/24/20.  She can come in for follow-up chest x-ray. Clearly better.  She will update me as needed.  She agrees with plan.

## 2020-08-01 ENCOUNTER — Telehealth: Payer: Self-pay | Admitting: *Deleted

## 2020-08-01 LAB — CULTURE, BLOOD (ROUTINE X 2)

## 2020-08-01 NOTE — Telephone Encounter (Signed)
Post ED Visit - Positive Culture Follow-up  Culture report reviewed by antimicrobial stewardship pharmacist: [place] Team []  Elenor Quinones, Pharm.D. []  Heide Guile, Pharm.D., BCPS AQ-ID []  Parks Neptune, Pharm.D., BCPS []  Alycia Rossetti, Pharm.D., BCPS []  [place], [place].D., BCPS, AAHIVP []  Legrand Como, Pharm.D., BCPS, AAHIVP []  Salome Arnt, PharmD, BCPS []  Johnnette Gourd, PharmD, BCPS []  Hughes Better, PharmD, BCPS []  Leeroy Cha, PharmD []  Laqueta Linden, PharmD, BCPS []  Albertina Parr, PharmD  [place] Team []  Leodis Sias, PharmD []  Lindell Spar, PharmD []  Royetta Asal, PharmD []  Graylin Shiver, Rph []  Rema Fendt) Glennon Mac, PharmD []  Arlyn Dunning, PharmD []  Netta Cedars, PharmD []  Dia Sitter, PharmD []  Leone Haven, PharmD []  Gretta Arab, PharmD []  Theodis Shove, PharmD []  Peggyann Juba, PharmD []  Reuel Boom, PharmD   Positive blood culture Treated with Amoxicillin by PCP, organism sensitive to the same and no further patient follow-up is required at this time.  Harlon Flor [facility] 08/01/2020, 9:39 AM

## 2020-08-02 DIAGNOSIS — J189 Pneumonia, unspecified organism: Secondary | ICD-10-CM | POA: Insufficient documentation

## 2020-08-02 NOTE — Assessment & Plan Note (Signed)
  Pneumonia. Note from pharmacy at hospital discussed with patient.  "Her ED blood cultures collected on 4/7 have resulted positive in 4/4 bottles for Strep pneumo (sensitive to penicillin - MIC < 0.06). She only received doxy on d/c from the ED. I would recommend treating her with amoxicillin 500mg  TID x 7-10 days. Thank you!" Amoxil rx sent.  Rationale discussed with patient.  She agrees.  She will finish current doxycycline prescription and add on Amoxil. Needs f/u CXR in early May, rationale discussed with patient..  Sx started around 07/24/20.  She can come in for follow-up chest x-ray. Clearly better.  She will update me as needed.  She agrees with plan.

## 2020-08-22 ENCOUNTER — Ambulatory Visit (INDEPENDENT_AMBULATORY_CARE_PROVIDER_SITE_OTHER)
Admission: RE | Admit: 2020-08-22 | Discharge: 2020-08-22 | Disposition: A | Discharge status: Home / Self Care | Payer: Medicare HMO | Source: Ambulatory Visit | Attending: Family Medicine | Admitting: Family Medicine

## 2020-08-22 DIAGNOSIS — J9811 Atelectasis: Secondary | ICD-10-CM | POA: Diagnosis not present

## 2020-08-22 DIAGNOSIS — J189 Pneumonia, unspecified organism: Secondary | ICD-10-CM

## 2020-08-23 ENCOUNTER — Other Ambulatory Visit: Payer: Self-pay | Admitting: Family Medicine

## 2020-08-23 DIAGNOSIS — J189 Pneumonia, unspecified organism: Secondary | ICD-10-CM

## 2020-09-27 ENCOUNTER — Ambulatory Visit (INDEPENDENT_AMBULATORY_CARE_PROVIDER_SITE_OTHER)
Admission: RE | Admit: 2020-09-27 | Discharge: 2020-09-27 | Disposition: A | Discharge status: Home / Self Care | Payer: Medicare HMO | Source: Ambulatory Visit | Attending: Family Medicine | Admitting: Family Medicine

## 2020-09-27 DIAGNOSIS — J189 Pneumonia, unspecified organism: Secondary | ICD-10-CM

## 2020-09-27 DIAGNOSIS — R9389 Abnormal findings on diagnostic imaging of other specified body structures: Secondary | ICD-10-CM | POA: Diagnosis not present

## 2020-10-01 ENCOUNTER — Other Ambulatory Visit: Payer: Self-pay | Admitting: Family Medicine

## 2020-10-01 DIAGNOSIS — J189 Pneumonia, unspecified organism: Secondary | ICD-10-CM

## 2020-10-29 NOTE — Addendum Note (Signed)
Encounter addended by: Annie Paras on: 10/29/2020 4:48 PM  Actions taken: Letter saved

## 2020-10-31 ENCOUNTER — Telehealth: Payer: Self-pay

## 2020-10-31 NOTE — Telephone Encounter (Signed)
LVM for pt informing that our x-ray is currently down.  Provided address and instructions for [facility].  Told pt to call if there were any questions regarding this.

## 2020-10-31 NOTE — Telephone Encounter (Signed)
Noted. Thanks.

## 2020-11-23 ENCOUNTER — Other Ambulatory Visit: Payer: Self-pay

## 2020-11-23 ENCOUNTER — Ambulatory Visit
Admission: RE | Admit: 2020-11-23 | Discharge: 2020-11-23 | Disposition: A | Discharge status: Home / Self Care | Payer: Medicare HMO | Source: Ambulatory Visit | Attending: Nurse Practitioner | Admitting: Nurse Practitioner

## 2020-11-23 ENCOUNTER — Encounter: Payer: Self-pay | Admitting: Nurse Practitioner

## 2020-11-23 ENCOUNTER — Ambulatory Visit (INDEPENDENT_AMBULATORY_CARE_PROVIDER_SITE_OTHER): Payer: Medicare HMO | Admitting: Nurse Practitioner

## 2020-11-23 VITALS — BP 112/76 | HR 86 | Temp 98.0°F | Resp 18 | Ht 63.0 in | Wt 125.5 lb

## 2020-11-23 DIAGNOSIS — R918 Other nonspecific abnormal finding of lung field: Secondary | ICD-10-CM | POA: Diagnosis not present

## 2020-11-23 DIAGNOSIS — R4189 Other symptoms and signs involving cognitive functions and awareness: Secondary | ICD-10-CM | POA: Diagnosis not present

## 2020-11-23 DIAGNOSIS — G8929 Other chronic pain: Secondary | ICD-10-CM | POA: Diagnosis not present

## 2020-11-23 DIAGNOSIS — Z8701 Personal history of pneumonia (recurrent): Secondary | ICD-10-CM | POA: Diagnosis not present

## 2020-11-23 DIAGNOSIS — R011 Cardiac murmur, unspecified: Secondary | ICD-10-CM | POA: Diagnosis not present

## 2020-11-23 DIAGNOSIS — R922 Inconclusive mammogram: Secondary | ICD-10-CM | POA: Diagnosis not present

## 2020-11-23 DIAGNOSIS — Z0000 Encounter for general adult medical examination without abnormal findings: Secondary | ICD-10-CM

## 2020-11-23 DIAGNOSIS — M25562 Pain in left knee: Secondary | ICD-10-CM

## 2020-11-23 MED ORDER — MELOXICAM 15 MG PO TABS: 15.0000 mg | ORAL_TABLET | Freq: Every day | ORAL | 2 refills | Status: DC

## 2020-11-23 NOTE — Progress Notes (Signed)
Established Patient Office Visit  Subjective:  Patient ID: Christy Kelley, female    DOB: 22-Sep-1950  Age: 70 y.o. MRN: [id number]  CC:  Chief Complaint  Patient presents with   Transfer of Care    From [facility]   Medication Refill    Meloxicam-for chronic left knee pain   Discuss lingering symptoms post covid-positive on April 6th    HPI Christy Kelley presents for Lingering covid symptoms. States that she has lost her taste. 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 Her husband was concerned. States that she felt like it has gotten better over time but has not resolved.   Left knee: Been dealing with it for years. Has been evaluated by ortho and per patient report they did not recommend a knee replacement. States the pain is sharp and dull in nature and occurs intermittently. Worse with movement and weight baring, better with rest. Patient states that there is no associated sitffness She has been taking meloxicam every other day dosing, with food and experiences some relief.  No past medical history on file.  No past surgical history on file.  Family History  Problem Relation Age of Onset   Arthritis/Rheumatoid Mother    Diabetes Father    Cancer Father     Social History   Socioeconomic History   Marital status: Married    Spouse name: Not on file   Number of children: Not on file   Years of education: Not on file   Highest education level: Not on file  Occupational History   Not on file  Tobacco Use   Smoking status: Former    Packs/day: 0.25    Years: 35.00    Pack years: 8.75    Types: Cigarettes    Quit date: 08/29/2020    Years since quitting: 0.2   Smokeless tobacco: Never  Vaping Use   Vaping Use: Never used  Substance and Sexual Activity   Alcohol use: Yes   Drug use: Never   Sexual activity: Not on file  Other Topics Concern   Not on file  Social History Narrative   Not on file   Social Determinants of Health    Financial Resource Strain: Not on file  Food Insecurity: Not on file  Transportation Needs: Not on file  Physical Activity: Not on file  Stress: Not on file  Social Connections: Not on file  Intimate Partner Violence: Not on file    Outpatient Medications Prior to Visit  Medication Sig Dispense Refill   Ascorbic Acid (VITAMIN C PO) Take 1,200 mg by mouth.     meloxicam (MOBIC) 15 MG tablet Take 15 mg by mouth daily.     Multiple Vitamin (MULTIVITAMIN) tablet Take 1 tablet by mouth daily.     VITAMIN D, CHOLECALCIFEROL, PO Take by mouth. 2 tablets daily     amoxicillin (AMOXIL) 500 MG capsule Take 1 capsule (500 mg total) by mouth 3 (three) times daily. 30 capsule 0   Vitamin D, Ergocalciferol, (DRISDOL) 1.25 MG (50000 UNIT) CAPS capsule Take 50,000 Units by mouth every 7 (seven) days.     No facility-administered medications prior to visit.    No Known Allergies  ROS Review of Systems  Constitutional:  Negative for chills and fever.  Respiratory:  Negative for shortness of breath.   Cardiovascular:  Negative for chest pain and leg swelling.  Gastrointestinal:  Negative for diarrhea, nausea and vomiting.  Musculoskeletal:  Positive  for arthralgias.  Neurological:  Negative for weakness.     Objective:    Physical Exam Vitals and nursing note reviewed.  Constitutional:      Appearance: Normal appearance. She is normal weight.  Neck:     Thyroid: No thyroid mass, thyromegaly or thyroid tenderness.     Vascular: No carotid bruit.  Cardiovascular:     Rate and Rhythm: Normal rate and regular rhythm.     Heart sounds: Murmur heard.  Diastolic murmur is present with a grade of 2/4.  Pulmonary:     Effort: Pulmonary effort is normal.     Breath sounds: Normal breath sounds.  Abdominal:     General: Bowel sounds are normal.  Musculoskeletal:        General: No deformity.     Cervical back: No tenderness.     Right lower leg: No edema.     Left lower leg: No edema.   Lymphadenopathy:     Cervical: No cervical adenopathy.  Skin:    General: Skin is warm.  Neurological:     Mental Status: She is alert.  Psychiatric:        Mood and Affect: Mood normal.        Thought Content: Thought content normal.    BP 112/76   Pulse 86   Temp 98 F (36.7 C)   Resp 18   Ht 5\' 3"  (1.6 m)   Wt 125 lb 8 oz (56.9 kg)   LMP  (LMP Unknown)   SpO2 96%   BMI 22.23 kg/m  Wt Readings from Last 3 Encounters:  11/23/20 125 lb 8 oz (56.9 kg)  07/31/20 125 lb (56.7 kg)  07/27/20 130 lb (59 kg)     Health Maintenance Due  Topic Date Due   Hepatitis C Screening  Never done   TETANUS/TDAP  Never done   COLONOSCOPY (Pts 45-45yrs Insurance coverage will need to be confirmed)  Never done   Zoster Vaccines- Shingrix (1 of 2) Never done   MAMMOGRAM  10/11/2009   DEXA SCAN  Never done   PNA vac Low Risk Adult (1 of 2 - PCV13) Never done   COVID-19 Vaccine (4 - Booster for Pfizer series) 06/11/2020   INFLUENZA VACCINE  11/20/2020    There are no preventive care reminders to display for this patient.  Lab Results  Component Value Date   TSH 0.98 12/23/2018   Lab Results  Component Value Date   WBC 12.1 (H) 07/27/2020   HGB 12.4 07/27/2020   HCT 37.3 07/27/2020   MCV 98.4 07/27/2020   PLT 227 07/27/2020   Lab Results  Component Value Date   NA 137 07/27/2020   K 3.5 07/27/2020   CO2 24 07/27/2020   GLUCOSE 145 (H) 07/27/2020   BUN 24 (H) 07/27/2020   CREATININE 0.78 07/27/2020   BILITOT 0.7 07/27/2020   ALKPHOS 93 07/27/2020   AST 36 07/27/2020   ALT 20 07/27/2020   PROT 7.1 07/27/2020   ALBUMIN 3.6 07/27/2020   CALCIUM 8.9 07/27/2020   ANIONGAP 10 07/27/2020   GFR 100.10 12/23/2018   Lab Results  Component Value Date   CHOL 171 12/23/2018   Lab Results  Component Value Date   HDL 63.00 12/23/2018   Lab Results  Component Value Date   LDLCALC 76 12/23/2018   Lab Results  Component Value Date   TRIG 157.0 (H) 12/23/2018   Lab  Results  Component Value Date   CHOLHDL 3 12/23/2018  No results found for: HGBA1C    Assessment & Plan:   Problem List Items Addressed This Visit       Other   Chronic pain of left knee - Primary    Same.  Patient states she has been evaluated by orthopedist in the past.  They did not recommend any intervention or replacement of joint.  She has been maintained on meloxicam 15 mg every other day dosing for approximately a year.  Seems to be doing well on that. Continue meloxicam 15 mg every other day dosing.       Relevant Medications   meloxicam (MOBIC) 15 MG tablet   Brain fog    Was diagnosed with COVID on 07-27-2020.  Patient states ever since then she has been having "brain fog".  States that she used to be a blocker and since then has been unable to do so.  States "just cannot keep up with it".  Has seen improvement since diagnosis per her report.  Does not seem to be interfering with day-to-day function patient claims that she still has good memory continue to monitor no intervention currently.       Murmur, cardiac    Noticed on physical exam today.  An aortic valve listening point.  Patient denies history of the same.  Not currently symptomatic.  Told her we can discuss further and have work-up at her physical in a few weeks.  Continue to monitor       Breast density    Patient states had a mammogram approximately 20 years ago that showed breast density and has not had one since.        No orders of the defined types were placed in this encounter.   Follow-up: Return in about 2 years (around 11/24/2022) for CPE and Labs.    Romilda Garret, NP

## 2020-11-23 NOTE — Assessment & Plan Note (Signed)
Patient states had a mammogram approximately 20 years ago that showed breast density and has not had one since.

## 2020-11-23 NOTE — Assessment & Plan Note (Signed)
Same.  Patient states she has been evaluated by orthopedist in the past.  They did not recommend any intervention or replacement of joint.  She has been maintained on meloxicam 15 mg every other day dosing for approximately a year.  Seems to be doing well on that. Continue meloxicam 15 mg every other day dosing.

## 2020-11-23 NOTE — Assessment & Plan Note (Signed)
Noticed on physical exam today.  An aortic valve listening point.  Patient denies history of the same.  Not currently symptomatic.  Told her we can discuss further and have work-up at her physical in a few weeks.  Continue to monitor

## 2020-11-23 NOTE — Addendum Note (Signed)
Addended by: Michela Pitcher on: 11/23/2020 12:57 PM   Modules accepted: Orders

## 2020-11-23 NOTE — Assessment & Plan Note (Signed)
Was diagnosed with COVID on 07-27-2020.  Patient states ever since then she has been having "brain fog".  States that she used to be a blocker and since then has been unable to do so.  States "just cannot keep up with it".  Has seen improvement since diagnosis per her report.  Does not seem to be interfering with day-to-day function patient claims that she still has good memory continue to monitor no intervention currently.

## 2020-11-23 NOTE — Patient Instructions (Signed)
Will send in your medication to your pharmacy Will get you scheduled for a Physical in a couple weeks Follow up as needed.

## 2020-11-24 ENCOUNTER — Other Ambulatory Visit (INDEPENDENT_AMBULATORY_CARE_PROVIDER_SITE_OTHER): Payer: Medicare HMO

## 2020-11-24 DIAGNOSIS — Z0000 Encounter for general adult medical examination without abnormal findings: Secondary | ICD-10-CM | POA: Diagnosis not present

## 2020-11-24 DIAGNOSIS — R4189 Other symptoms and signs involving cognitive functions and awareness: Secondary | ICD-10-CM | POA: Diagnosis not present

## 2020-11-24 LAB — LIPID PANEL
Cholesterol: 192 mg/dL (ref 0–200)
HDL: 58.3 mg/dL (ref >39.00)
NonHDL: 133.86
Total CHOL/HDL Ratio: 3
Triglycerides: 246 mg/dL — ABNORMAL HIGH (ref 0.0–149.0)
VLDL: 49.2 mg/dL — ABNORMAL HIGH (ref 0.0–40.0)

## 2020-11-24 LAB — CBC
HCT: 38.3 % (ref 36.0–46.0)
Hemoglobin: 12.8 g/dL (ref 12.0–15.0)
MCHC: 33.4 g/dL (ref 30.0–36.0)
MCV: 97.9 fl (ref 78.0–100.0)
Platelets: 275 10*3/uL (ref 150.0–400.0)
RBC: 3.91 Mil/uL (ref 3.87–5.11)
RDW: 13.9 % (ref 11.5–15.5)
WBC: 5.8 10*3/uL (ref 4.0–10.5)

## 2020-11-24 LAB — COMPREHENSIVE METABOLIC PANEL
ALT: 11 U/L (ref 0–35)
AST: 23 U/L (ref 0–37)
Albumin: 4 g/dL (ref 3.5–5.2)
Alkaline Phosphatase: 72 U/L (ref 39–117)
BUN: 18 mg/dL (ref 6–23)
CO2: 30 mEq/L (ref 19–32)
Calcium: 9.4 mg/dL (ref 8.4–10.5)
Chloride: 103 mEq/L (ref 96–112)
Creatinine, Ser: 0.71 mg/dL (ref 0.40–1.20)
GFR: 87.82 mL/min (ref >60.00)
Glucose, Bld: 81 mg/dL (ref 70–99)
Potassium: 4.5 mEq/L (ref 3.5–5.1)
Sodium: 138 mEq/L (ref 135–145)
Total Bilirubin: 0.4 mg/dL (ref 0.2–1.2)
Total Protein: 5.9 g/dL — ABNORMAL LOW (ref 6.0–8.3)

## 2020-11-24 LAB — VITAMIN D 25 HYDROXY (VIT D DEFICIENCY, FRACTURES): VITD: 87.4 ng/mL (ref 30.00–100.00)

## 2020-11-24 LAB — TSH: TSH: 1.13 u[IU]/mL (ref 0.35–5.50)

## 2020-11-24 LAB — LDL CHOLESTEROL, DIRECT: Direct LDL: 55 mg/dL

## 2020-11-24 LAB — VITAMIN B12: Vitamin B-12: 396 pg/mL (ref 211–911)

## 2020-11-27 ENCOUNTER — Telehealth: Payer: Self-pay | Admitting: Nurse Practitioner

## 2020-11-27 NOTE — Telephone Encounter (Signed)
Called patient to discuss chest xray and next steps. Patient did not answer but left voice message to call me back at [phone]. If I do not hear from her I will reach back out later.

## 2020-11-28 ENCOUNTER — Telehealth: Payer: Self-pay | Admitting: Nurse Practitioner

## 2020-11-28 NOTE — Telephone Encounter (Signed)
Called patient on the phone about her cxr which showed continual improvement. Told her we need to repeat chest xray in 4-6 weeks she declined doing that. Per her and her spouses request I reviewed preventive health with patient and offered the following services  Colonoscopy: states she dropped off a stool sample the day she saw me in office for a cologuard  Mammogram: Declined DEXA: declined Echo for Murmur: declined  If anything changes she is free to call the office and let me know and we can pursue any of the above for her.

## 2020-12-05 ENCOUNTER — Telehealth: Payer: Self-pay | Admitting: Nurse Practitioner

## 2020-12-05 NOTE — Telephone Encounter (Signed)
I did not see anything in the chart about cologuard or stool test been ordered. Please review. Thank you.

## 2020-12-05 NOTE — Telephone Encounter (Signed)
Mrs. Christy Kelley called in wanted to know about getting a referral for colonoscopy. And she prefers the doctor to be in [place].

## 2020-12-05 NOTE — Telephone Encounter (Signed)
She did the cologuard and it came back neg and wanted to schedule a colonoscopy.

## 2020-12-06 ENCOUNTER — Other Ambulatory Visit: Payer: Self-pay | Admitting: Nurse Practitioner

## 2020-12-06 DIAGNOSIS — Z1211 Encounter for screening for malignant neoplasm of colon: Secondary | ICD-10-CM

## 2020-12-06 NOTE — Telephone Encounter (Signed)
Left message for patient to call back  

## 2020-12-07 ENCOUNTER — Other Ambulatory Visit: Payer: Medicare HMO

## 2020-12-08 ENCOUNTER — Other Ambulatory Visit: Payer: Self-pay | Admitting: Nurse Practitioner

## 2020-12-08 DIAGNOSIS — R9389 Abnormal findings on diagnostic imaging of other specified body structures: Secondary | ICD-10-CM

## 2020-12-08 DIAGNOSIS — Z1211 Encounter for screening for malignant neoplasm of colon: Secondary | ICD-10-CM

## 2020-12-08 DIAGNOSIS — R011 Cardiac murmur, unspecified: Secondary | ICD-10-CM

## 2020-12-08 NOTE — Telephone Encounter (Signed)
Left message for patient to call me back. 

## 2020-12-08 NOTE — Telephone Encounter (Signed)
Patient advised that her referral was sent to [facility] and they will contact patient to schedule directly, advised patient this can take a week or more to hear back on.  Patient does want to proceed with Chest xray follow up-please place order for [facility] imaging location- I discussed this with patient and when she can go to have this done. Please place order for Echo for [facility] location. Thank you

## 2020-12-12 NOTE — Telephone Encounter (Signed)
Patient advised.  Patient was following up on Echo but I do not see an order placed for that. Did you still want the patient to have this done? For [facility] [place] location. Thank you

## 2020-12-12 NOTE — Telephone Encounter (Signed)
noted 

## 2020-12-15 ENCOUNTER — Encounter: Payer: Self-pay | Admitting: Internal Medicine

## 2021-01-16 ENCOUNTER — Emergency Department (HOSPITAL_COMMUNITY): Payer: Medicare HMO

## 2021-01-16 ENCOUNTER — Ambulatory Visit
Admission: RE | Admit: 2021-01-16 | Discharge: 2021-01-16 | Disposition: A | Discharge status: Home / Self Care | Payer: Medicare HMO | Source: Ambulatory Visit | Attending: Nurse Practitioner | Admitting: Nurse Practitioner

## 2021-01-16 ENCOUNTER — Telehealth: Payer: Self-pay | Admitting: *Deleted

## 2021-01-16 ENCOUNTER — Other Ambulatory Visit: Payer: Self-pay

## 2021-01-16 ENCOUNTER — Encounter (HOSPITAL_COMMUNITY): Payer: Self-pay

## 2021-01-16 ENCOUNTER — Inpatient Hospital Stay (HOSPITAL_COMMUNITY)
Admission: EM | Admit: 2021-01-16 | Discharge: 2021-01-19 | DRG: 180 | Disposition: A | Discharge status: Home / Self Care | Payer: Medicare HMO | Attending: Internal Medicine | Admitting: Internal Medicine

## 2021-01-16 DIAGNOSIS — J984 Other disorders of lung: Secondary | ICD-10-CM | POA: Diagnosis not present

## 2021-01-16 DIAGNOSIS — Z79899 Other long term (current) drug therapy: Secondary | ICD-10-CM

## 2021-01-16 DIAGNOSIS — R918 Other nonspecific abnormal finding of lung field: Secondary | ICD-10-CM

## 2021-01-16 DIAGNOSIS — Z20822 Contact with and (suspected) exposure to covid-19: Secondary | ICD-10-CM | POA: Diagnosis present

## 2021-01-16 DIAGNOSIS — J189 Pneumonia, unspecified organism: Secondary | ICD-10-CM | POA: Diagnosis not present

## 2021-01-16 DIAGNOSIS — R410 Disorientation, unspecified: Secondary | ICD-10-CM

## 2021-01-16 DIAGNOSIS — Z791 Long term (current) use of non-steroidal anti-inflammatories (NSAID): Secondary | ICD-10-CM

## 2021-01-16 DIAGNOSIS — Z8701 Personal history of pneumonia (recurrent): Secondary | ICD-10-CM

## 2021-01-16 DIAGNOSIS — R000 Tachycardia, unspecified: Secondary | ICD-10-CM | POA: Diagnosis not present

## 2021-01-16 DIAGNOSIS — R599 Enlarged lymph nodes, unspecified: Secondary | ICD-10-CM | POA: Diagnosis not present

## 2021-01-16 DIAGNOSIS — E876 Hypokalemia: Secondary | ICD-10-CM | POA: Diagnosis present

## 2021-01-16 DIAGNOSIS — C3401 Malignant neoplasm of right main bronchus: Secondary | ICD-10-CM | POA: Diagnosis not present

## 2021-01-16 DIAGNOSIS — R7989 Other specified abnormal findings of blood chemistry: Secondary | ICD-10-CM

## 2021-01-16 DIAGNOSIS — F17210 Nicotine dependence, cigarettes, uncomplicated: Secondary | ICD-10-CM | POA: Diagnosis present

## 2021-01-16 DIAGNOSIS — J9601 Acute respiratory failure with hypoxia: Secondary | ICD-10-CM | POA: Diagnosis present

## 2021-01-16 DIAGNOSIS — G9340 Encephalopathy, unspecified: Secondary | ICD-10-CM | POA: Diagnosis present

## 2021-01-16 DIAGNOSIS — I248 Other forms of acute ischemic heart disease: Secondary | ICD-10-CM | POA: Diagnosis present

## 2021-01-16 DIAGNOSIS — E871 Hypo-osmolality and hyponatremia: Secondary | ICD-10-CM

## 2021-01-16 DIAGNOSIS — R739 Hyperglycemia, unspecified: Secondary | ICD-10-CM

## 2021-01-16 DIAGNOSIS — R778 Other specified abnormalities of plasma proteins: Secondary | ICD-10-CM | POA: Diagnosis present

## 2021-01-16 DIAGNOSIS — R9389 Abnormal findings on diagnostic imaging of other specified body structures: Secondary | ICD-10-CM

## 2021-01-16 DIAGNOSIS — U099 Post covid-19 condition, unspecified: Secondary | ICD-10-CM | POA: Diagnosis present

## 2021-01-16 DIAGNOSIS — R0902 Hypoxemia: Secondary | ICD-10-CM

## 2021-01-16 DIAGNOSIS — R0602 Shortness of breath: Secondary | ICD-10-CM | POA: Diagnosis not present

## 2021-01-16 DIAGNOSIS — R059 Cough, unspecified: Secondary | ICD-10-CM | POA: Diagnosis not present

## 2021-01-16 DIAGNOSIS — R062 Wheezing: Secondary | ICD-10-CM | POA: Diagnosis not present

## 2021-01-16 LAB — CBC
HCT: 37.6 % (ref 36.0–46.0)
Hemoglobin: 13 g/dL (ref 12.0–15.0)
MCH: 32.2 pg (ref 26.0–34.0)
MCHC: 34.6 g/dL (ref 30.0–36.0)
MCV: 93.1 fL (ref 80.0–100.0)
Platelets: 393 10*3/uL (ref 150–400)
RBC: 4.04 MIL/uL (ref 3.87–5.11)
RDW: 12.3 % (ref 11.5–15.5)
WBC: 7 10*3/uL (ref 4.0–10.5)
nRBC: 0 % (ref 0.0–0.2)

## 2021-01-16 LAB — RESP PANEL BY RT-PCR (FLU A&B, COVID) ARPGX2
Influenza A by PCR: NEGATIVE
Influenza B by PCR: NEGATIVE
SARS Coronavirus 2 by RT PCR: NEGATIVE

## 2021-01-16 LAB — BASIC METABOLIC PANEL
Anion gap: 11 (ref 5–15)
BUN: 13 mg/dL (ref 8–23)
CO2: 25 mmol/L (ref 22–32)
Calcium: 9.2 mg/dL (ref 8.9–10.3)
Chloride: 91 mmol/L — ABNORMAL LOW (ref 98–111)
Creatinine, Ser: 0.61 mg/dL (ref 0.44–1.00)
GFR, Estimated: >60 mL/min (ref >60)
Glucose, Bld: 143 mg/dL — ABNORMAL HIGH (ref 70–99)
Potassium: 3.5 mmol/L (ref 3.5–5.1)
Sodium: 127 mmol/L — ABNORMAL LOW (ref 135–145)

## 2021-01-16 LAB — TROPONIN I (HIGH SENSITIVITY)
Troponin I (High Sensitivity): 27 ng/L — ABNORMAL HIGH (ref <18)
Troponin I (High Sensitivity): 35 ng/L — ABNORMAL HIGH (ref <18)

## 2021-01-16 MED ORDER — IOHEXOL 350 MG/ML SOLN
80.0000 mL | Freq: Once | INTRAVENOUS | Status: AC | PRN
  Administered 2021-01-16: 80 mL via INTRAVENOUS

## 2021-01-16 MED ORDER — IPRATROPIUM-ALBUTEROL 0.5-2.5 (3) MG/3ML IN SOLN
3.0000 mL | RESPIRATORY_TRACT | Status: AC
Start: 2021-01-16 — End: 2021-01-16
  Administered 2021-01-16 (×3): 3 mL via RESPIRATORY_TRACT
  Filled 2021-01-16: qty 3

## 2021-01-16 MED ORDER — ALBUTEROL SULFATE HFA 108 (90 BASE) MCG/ACT IN AERS
2.0000 | INHALATION_SPRAY | RESPIRATORY_TRACT | Status: DC | PRN
  Administered 2021-01-16: 2 via RESPIRATORY_TRACT
  Filled 2021-01-16: qty 6.7

## 2021-01-16 MED ORDER — METHYLPREDNISOLONE SODIUM SUCC 125 MG IJ SOLR
125.0000 mg | Freq: Once | INTRAMUSCULAR | Status: AC
  Administered 2021-01-16: 125 mg via INTRAVENOUS
  Filled 2021-01-16: qty 2

## 2021-01-16 NOTE — ED Triage Notes (Signed)
Pt reports shob and wheezing that started last night suddenly. Also c/o dry cough.    Denies chest pain or dizziness  Reports she has never been diagnosed with asthma or COPD.  Patient does not use an inhaler.   Pt reports she smokes cigarettes 0.5 pack/day   A/Ox4 Ambulatory in triage.

## 2021-01-16 NOTE — Telephone Encounter (Signed)
Tried to call patient and got her voicemail. Left a message on voicemail for patient to call the office back.

## 2021-01-16 NOTE — Telephone Encounter (Signed)
Spoke to patient's husband and was advised that his wife hung up and did not talk with access nurse. Patient's husband stated that she has been wheezing for several weeks but it has been getting worse. Patient's husband stated that she had pneumonia and covid back in April. Patient's husband stated that they have not done a covid test. [name] is wheezing badly and I could hear it over the phone.  Patient's husband stated that his wife needs to be seen and wants an appointment today or tomorrow.at the office. Patient had a chest xray done today but has not gotten the results.Advised patient's husband since Christy Garret NP is out of the office this afternoon I will send it to his supervising MD and see what she recommends.

## 2021-01-16 NOTE — ED Provider Notes (Signed)
[facility] DEPT Provider Note   CSN: [id number] Arrival date & time: 01/16/21  1644     History Chief Complaint  Patient presents with   Shortness of Breath    Christy Kelley is a 70 y.o. female.  The history is provided by the patient and the spouse.  Shortness of Breath Severity:  Moderate Onset quality:  Gradual Duration:  4 days Timing:  Constant Progression:  Unchanged Chronicity:  Recurrent Context: smoke exposure   Associated symptoms: cough, fever, sputum production and wheezing   Associated symptoms: no abdominal pain, no chest pain, no headaches, no hemoptysis and no vomiting   Risk factors: tobacco use    70 year old female with history of of commune acquired pneumonia, COVID-19 infection with subsequent brain fog, longstanding half pack a day smoking history with no clear diagnosis of COPD presents emergency department with cough productive of sputum, shortness of breath and wheezing.  The history was brought by the patient and the patient's husband who states that her symptoms have been present for the past 3 to 4 days.  She has been coughing up green/yellow sputum.  She denies any chest pain.  She endorses persistent dyspnea.  No swelling in her lower extremities.  She endorses a low-grade fever with chills.  History reviewed. No pertinent past medical history.  Patient Active Problem List   Diagnosis Date Noted   Brain fog 11/23/2020   Murmur, cardiac 11/23/2020   Breast density 11/23/2020   CAP (community acquired pneumonia) 08/02/2020   Chronic pain of left knee 12/23/2018   Family history of rheumatoid arthritis 12/23/2018    History reviewed. No pertinent surgical history.   OB History   No obstetric history on file.     Family History  Problem Relation Age of Onset   Arthritis/Rheumatoid Mother    Diabetes Father    Cancer Father     Social History   Tobacco Use   Smoking status: Former    Packs/day: 0.25     Years: 35.00    Pack years: 8.75    Types: Cigarettes    Quit date: 08/29/2020    Years since quitting: 0.3   Smokeless tobacco: Never  Vaping Use   Vaping Use: Never used  Substance Use Topics   Alcohol use: Yes    Comment: Beer 6 daily. Recenlty went to 2-3 dialy   Drug use: Never    Home Medications Prior to Admission medications   Medication Sig Start Date End Date Taking? Authorizing Provider  Ascorbic Acid (VITAMIN C PO) Take 1,000 mg by mouth daily.   Yes [provider]  meloxicam (MOBIC) 15 MG tablet Take 1 tablet (15 mg total) by mouth daily. Patient taking differently: Take 15 mg by mouth every other day. 11/23/20  Yes Michela Pitcher, NP  Multiple Vitamin (MULTIVITAMIN) tablet Take 1 tablet by mouth daily.   Yes [provider]  Vitamin D, Cholecalciferol, 25 MCG (1000 UT) TABS Take 1,000 Units by mouth daily.   Yes [provider]    Allergies    Patient has no known allergies.  Review of Systems   Review of Systems  Constitutional:  Positive for fever.  Respiratory:  Positive for cough, sputum production, shortness of breath and wheezing. Negative for hemoptysis.   Cardiovascular:  Negative for chest pain.  Gastrointestinal:  Negative for abdominal pain and vomiting.  Neurological:  Negative for headaches.  All other systems reviewed and are negative.  Physical Exam Updated Vital  Signs BP 132/84   Pulse 100   Temp 97.6 F (36.4 C) (Oral)   Resp 20   LMP  (LMP Unknown)   SpO2 90%   Physical Exam Vitals and nursing note reviewed.  Constitutional:      General: She is not in acute distress.    Appearance: She is well-developed. She is not ill-appearing.  HENT:     Head: Normocephalic and atraumatic.  Eyes:     Conjunctiva/sclera: Conjunctivae normal.  Cardiovascular:     Rate and Rhythm: Normal rate and regular rhythm.     Heart sounds: No murmur heard. Pulmonary:     Effort: Pulmonary effort is normal. No respiratory  distress.     Breath sounds: Examination of the right-upper field reveals decreased breath sounds and wheezing. Examination of the left-upper field reveals decreased breath sounds and wheezing. Examination of the right-middle field reveals decreased breath sounds and wheezing. Examination of the left-middle field reveals decreased breath sounds and wheezing. Examination of the right-lower field reveals decreased breath sounds and wheezing. Examination of the left-lower field reveals decreased breath sounds and wheezing. Decreased breath sounds and wheezing present.  Abdominal:     Palpations: Abdomen is soft.     Tenderness: There is no abdominal tenderness.  Musculoskeletal:     Cervical back: Neck supple.     Right lower leg: No edema.     Left lower leg: No edema.  Skin:    General: Skin is warm and dry.  Neurological:     General: No focal deficit present.     Mental Status: She is alert and oriented to person, place, and time.     Cranial Nerves: No cranial nerve deficit.     Motor: No weakness.    ED Results / Procedures / Treatments   Labs (all labs ordered are listed, but only abnormal results are displayed) Labs Reviewed  BASIC METABOLIC PANEL - Abnormal; Notable for the following components:      Result Value   Sodium 127 (*)    Chloride 91 (*)    Glucose, Bld 143 (*)    All other components within normal limits  TROPONIN I (HIGH SENSITIVITY) - Abnormal; Notable for the following components:   Troponin I (High Sensitivity) 27 (*)    All other components within normal limits  TROPONIN I (HIGH SENSITIVITY) - Abnormal; Notable for the following components:   Troponin I (High Sensitivity) 35 (*)    All other components within normal limits  RESP PANEL BY RT-PCR (FLU A&B, COVID) [id number]  CBC  TROPONIN I (HIGH SENSITIVITY)    EKG EKG Interpretation  Date/Time:  Tuesday January 16 2021 16:55:51 EDT Ventricular Rate:  111 PR Interval:  197 QRS Duration: 95 QT  Interval:  351 QTC Calculation: 477 R Axis:   70 Text Interpretation: Sinus tachycardia Probable LVH with secondary repol abnrm Confirmed by Regan Lemming ([id number]) on 01/16/2021 5:06:05 PM  Radiology DG Chest 2 View  Result Date: 01/16/2021 CLINICAL DATA:  Abnormal chest x-ray. EXAM: CHEST - 2 VIEW COMPARISON:  11/23/2020 FINDINGS: Persistent linear densities in the right perihilar region extending to the periphery of the right lung. These linear densities have decreased since 11/23/2020. However, there may be slightly increased fullness in the right hilum. Remainder of the lungs are clear. Again noted is tenting of the right hemidiaphragm. Heart and mediastinum are within normal limits. No pleural effusions. No acute bone abnormality. IMPRESSION: 1. Linear densities in the right chest have decreased and  suggestive for resolving infectious or inflammatory process. However, there may be slightly increased fullness in the right hilum which could be related to the projection of the frontal image since minimal change on the lateral view. Recommend continued follow-up to evaluate this area. 2. No acute chest abnormality. Electronically Signed   By: Markus Daft M.D.   On: 01/16/2021 15:33   CT Angio Chest Pulmonary Embolism (PE) W or WO Contrast  Result Date: 01/16/2021 CLINICAL DATA:  Shortness of breath and wheezing dry cough EXAM: CT ANGIOGRAPHY CHEST WITH CONTRAST TECHNIQUE: Multidetector CT imaging of the chest was performed using the standard protocol during bolus administration of intravenous contrast. Multiplanar CT image reconstructions and MIPs were obtained to evaluate the vascular anatomy. CONTRAST:  32mL OMNIPAQUE IOHEXOL 350 MG/ML SOLN COMPARISON:  Chest x-ray 01/16/2021, CT chest 07/27/2020 FINDINGS: Cardiovascular: Satisfactory opacification of the pulmonary arteries to the segmental level. No evidence of pulmonary embolism. Narrowing of right upper lobe and distal right pulmonary artery by  lobulated soft tissue mass. Mild aortic atherosclerosis. No aneurysm. Normal cardiac size. No pericardial effusion. Coronary calcifications and mitral calcifications. Mediastinum/Nodes: Midline trachea. No thyroid mass. Right paratracheal node measures 16 mm. Precarinal lymph node measures 15 mm. Bulky right hilar mass measuring 4.7 by 4.2 by 3.7 cm approximately. Occlusion of the right main bronchus and proximal right lower lobe and middle lobe bronchi 6 with fluid or debris in the right middle and lower lobe bronchi. Lungs/Pleura: Hyperlucent right upper, middle and lower lobes. Largely cleared right upper lobe pneumonia. Peripheral bandlike density in the right upper lobe most likely scarring. Upper Abdomen: Subcentimeter hypodensity in the left hepatic lobe. No acute abnormality. Musculoskeletal: No acute or suspicious osseous abnormality. Review of the MIP images confirms the above findings. IMPRESSION: 1. Negative for acute pulmonary embolism. 2. Interval finding of bulky right hilar mass/neoplasm with narrowing of the distal right pulmonary artery and right upper lobe pulmonary vessels. Occlusion of distal right main bronchus and right lower and middle lobe bronchi by mass lesion. Fluid and or debris within distal right lower and middle lobe bronchi. There are multiple enlarged mediastinal lymph nodes. 3. Largely cleared right upper lobe pneumonia with peripheral bandlike density in the right upper lobe likely scarring. Aortic Atherosclerosis (ICD10-I70.0). Electronically Signed   By: Donavan Foil M.D.   On: 01/16/2021 23:22    Procedures Procedures   Medications Ordered in ED Medications  albuterol (VENTOLIN HFA) 108 (90 Base) MCG/ACT inhaler 2 puff (2 puffs Inhalation Given 01/16/21 1710)  methylPREDNISolone sodium succinate (SOLU-MEDROL) 125 mg/2 mL injection 125 mg (125 mg Intravenous Given 01/16/21 2216)  ipratropium-albuterol (DUONEB) 0.5-2.5 (3) MG/3ML nebulizer solution 3 mL (3 mLs  Nebulization Given 01/16/21 2221)  iohexol (OMNIPAQUE) 350 MG/ML injection 80 mL (80 mLs Intravenous Contrast Given 01/16/21 2247)    ED Course  I have reviewed the triage vital signs and the nursing notes.  Pertinent labs & imaging results that were available during my care of the patient were reviewed by me and considered in my medical decision making (see chart for details).    MDM Rules/Calculators/A&P                           70 year old female with history of of commune acquired pneumonia, COVID-19 infection with subsequent brain fog, longstanding half pack a day smoking history with no clear diagnosis of COPD presents emergency department with cough productive of sputum, shortness of breath and wheezing.  The  history was brought by the patient and the patient's husband who states that her symptoms have been present for the past 3 to 4 days.  She has been coughing up green/yellow sputum.  She denies any chest pain.  She endorses persistent dyspnea.  No swelling in her lower extremities.  She endorses a low-grade fever with chills.  Concern for PE acquired pneumonia versus COPD exacerbation versus PE.  We will evaluate further with CTA PE study.  We will treat for presumed COPD exacerbation with Solu-Medrol and duo nebs x3.  The patient did become hypoxic in the emergency department following DuoNeb treatment with acute hypoxic respiratory failure with oxygen saturations to 88% on room air.  She subsequently placed on 2 L O2 via nasal cannula.  Wheezing improved following DuoNeb treatment.  CTA PE study unfortunately did reveal a new lung mass With a bulky right hilar mass/neoplasm with narrowing of the distal right pulmonary artery and right upper lobe pulmonary vessels and occlusion of the distal right main bronchus and right lower and middle lobe bronchi.  Multiple enlarged reactive mediastinal lymph nodes.  No evidence of pneumonia or pulmonary embolism.  Given the patient's persistent  confusion over the last few months, a neurologic exam was performed which revealed no focal neurologic abnormality.  Will obtain a CT of the head to evaluate for possible metastatic lesions.  As the patient has new hypoxic respiratory failure requiring oxygen supplementation in the setting of likely new lung malignancy, hospitalist medicine was consulted for admission.  The patient and her husband were updated on the CT findings at bedside.    Final Clinical Impression(s) / ED Diagnoses Final diagnoses:  Acute respiratory failure with hypoxia ([place])  Mass of lung  Confusion    Rx / DC Orders ED Discharge Orders     None        Regan Lemming, MD 01/17/21 0000

## 2021-01-16 NOTE — Telephone Encounter (Signed)
That sounds worrisome- I think she needs to be seen emergently with that degree of wheezing and sob. I advise ER please (or UC if they refuse ER)    Will cc pcp

## 2021-01-16 NOTE — Telephone Encounter (Signed)
In the ER now

## 2021-01-16 NOTE — Telephone Encounter (Signed)
PLEASE NOTE: All timestamps contained within this report are represented as [place] Standard Time. CONFIDENTIALTY NOTICE: This fax transmission is intended only for the addressee. It contains information that is legally privileged, confidential or otherwise protected from use or disclosure. If you are not the intended recipient, you are strictly prohibited from reviewing, disclosing, copying using or disseminating any of this information or taking any action in reliance on or regarding this information. If you have received this fax in error, please notify us immediately by telephone so that we can arrange for its return to [place]. Phone: [phone], Toll-Free: [phone], Fax: [phone] Page: 1 of 1 Call Id: [id number] [facility] - Client TELEPHONE ADVICE RECORD [facility] Patient Name: Christy Kelley Gender: Female DOB: [birth year] Age: 30 Y 9 M 26 D Return Phone Number: [phone] (Primary), [phone] (Secondary) Address[place]  [address] Client [facility] - Client Client Site [place] - Day Physician Romilda Garret- NP Contact Type Call Who Is Calling Patient / Member / Family / Caregiver Call Type Triage / Clinical Relationship To Patient Self Return Phone Number [phone] (Primary) Chief Complaint WHEEZING Reason for Call Symptomatic / Request for [place] states she is experiencing wheezing. Caller was transferred by the office. Translation No Disp. Time Eilene Ghazi Time) Disposition Final User 01/16/2021 1:06:22 PM Send to Urgent Soledad Gerlach 01/16/2021 1:08:23 PM Attempt made - no message left Earleen Reaper 01/16/2021 1:21:28 PM Attempt made - no message left Earleen Reaper 01/16/2021 1:44:50 PM FINAL ATTEMPT MADE - message left Yes Ysidro Evert, RN, Levada Dy

## 2021-01-16 NOTE — ED Notes (Signed)
Pt placed on 2L of oxygen because her oxygen level dropped to 88

## 2021-01-16 NOTE — Telephone Encounter (Signed)
Patient's husband Gerald Stabs notified as instructed by telephone and verbalized understanding. Gerald Stabs stated with his wife having the covid fog and he feels that she is in bad shape he is going to take her to [place]. Gerald Stabs stated the last time he took her to the UC they had he take her to the ER.

## 2021-01-16 NOTE — ED Provider Notes (Signed)
Emergency Medicine Provider Triage Evaluation Note  Christy Kelley , a 70 y.o. female  was evaluated in triage.  Pt complains of sob, wheezing that started last night.  Review of Systems  Positive: Sob, wheezing Negative: cp  Physical Exam  BP (!) 146/93   Pulse (!) 113   Temp 97.6 F (36.4 C) (Oral)   Resp (!) 24   LMP  (LMP Unknown)   SpO2 95%  Gen:   Awake, no distress   Resp:  Normal effort  MSK:   Moves extremities without difficulty  Other:  Wheezing, tachycardia  Medical Decision Making  Medically screening exam initiated at 5:09 PM.  Appropriate orders placed.  [name] was informed that the remainder of the evaluation will be completed by another provider, this initial triage assessment does not replace that evaluation, and the importance of remaining in the ED until their evaluation is complete.     Rodney Booze, PA-C 01/16/21 1709    Blanchie Dessert, MD 01/17/21 1645

## 2021-01-17 ENCOUNTER — Inpatient Hospital Stay (HOSPITAL_COMMUNITY): Payer: Medicare HMO

## 2021-01-17 ENCOUNTER — Encounter (HOSPITAL_COMMUNITY): Payer: Self-pay | Admitting: Family Medicine

## 2021-01-17 DIAGNOSIS — C3401 Malignant neoplasm of right main bronchus: Secondary | ICD-10-CM | POA: Diagnosis not present

## 2021-01-17 DIAGNOSIS — J984 Other disorders of lung: Secondary | ICD-10-CM | POA: Diagnosis not present

## 2021-01-17 DIAGNOSIS — J9811 Atelectasis: Secondary | ICD-10-CM | POA: Diagnosis not present

## 2021-01-17 DIAGNOSIS — G9340 Encephalopathy, unspecified: Secondary | ICD-10-CM | POA: Diagnosis not present

## 2021-01-17 DIAGNOSIS — J9601 Acute respiratory failure with hypoxia: Secondary | ICD-10-CM | POA: Diagnosis not present

## 2021-01-17 DIAGNOSIS — R590 Localized enlarged lymph nodes: Secondary | ICD-10-CM

## 2021-01-17 DIAGNOSIS — R062 Wheezing: Secondary | ICD-10-CM | POA: Diagnosis not present

## 2021-01-17 DIAGNOSIS — R69 Illness, unspecified: Secondary | ICD-10-CM | POA: Diagnosis not present

## 2021-01-17 DIAGNOSIS — J90 Pleural effusion, not elsewhere classified: Secondary | ICD-10-CM | POA: Diagnosis not present

## 2021-01-17 DIAGNOSIS — Z720 Tobacco use: Secondary | ICD-10-CM | POA: Diagnosis not present

## 2021-01-17 DIAGNOSIS — I248 Other forms of acute ischemic heart disease: Secondary | ICD-10-CM | POA: Diagnosis not present

## 2021-01-17 DIAGNOSIS — C771 Secondary and unspecified malignant neoplasm of intrathoracic lymph nodes: Secondary | ICD-10-CM | POA: Diagnosis not present

## 2021-01-17 DIAGNOSIS — R739 Hyperglycemia, unspecified: Secondary | ICD-10-CM | POA: Diagnosis not present

## 2021-01-17 DIAGNOSIS — Z791 Long term (current) use of non-steroidal anti-inflammatories (NSAID): Secondary | ICD-10-CM | POA: Diagnosis not present

## 2021-01-17 DIAGNOSIS — E871 Hypo-osmolality and hyponatremia: Secondary | ICD-10-CM

## 2021-01-17 DIAGNOSIS — R4182 Altered mental status, unspecified: Secondary | ICD-10-CM | POA: Diagnosis not present

## 2021-01-17 DIAGNOSIS — R778 Other specified abnormalities of plasma proteins: Secondary | ICD-10-CM | POA: Diagnosis not present

## 2021-01-17 DIAGNOSIS — Z79899 Other long term (current) drug therapy: Secondary | ICD-10-CM | POA: Diagnosis not present

## 2021-01-17 DIAGNOSIS — R918 Other nonspecific abnormal finding of lung field: Secondary | ICD-10-CM

## 2021-01-17 DIAGNOSIS — U099 Post covid-19 condition, unspecified: Secondary | ICD-10-CM | POA: Diagnosis not present

## 2021-01-17 DIAGNOSIS — C3491 Malignant neoplasm of unspecified part of right bronchus or lung: Secondary | ICD-10-CM | POA: Diagnosis not present

## 2021-01-17 DIAGNOSIS — Z20822 Contact with and (suspected) exposure to covid-19: Secondary | ICD-10-CM | POA: Diagnosis not present

## 2021-01-17 DIAGNOSIS — R410 Disorientation, unspecified: Secondary | ICD-10-CM | POA: Diagnosis not present

## 2021-01-17 DIAGNOSIS — Z8701 Personal history of pneumonia (recurrent): Secondary | ICD-10-CM | POA: Diagnosis not present

## 2021-01-17 DIAGNOSIS — F17210 Nicotine dependence, cigarettes, uncomplicated: Secondary | ICD-10-CM | POA: Diagnosis present

## 2021-01-17 DIAGNOSIS — E876 Hypokalemia: Secondary | ICD-10-CM | POA: Diagnosis not present

## 2021-01-17 DIAGNOSIS — J188 Other pneumonia, unspecified organism: Secondary | ICD-10-CM | POA: Diagnosis not present

## 2021-01-17 DIAGNOSIS — C3490 Malignant neoplasm of unspecified part of unspecified bronchus or lung: Secondary | ICD-10-CM | POA: Diagnosis not present

## 2021-01-17 LAB — BASIC METABOLIC PANEL
Anion gap: 10 (ref 5–15)
BUN: 9 mg/dL (ref 8–23)
CO2: 25 mmol/L (ref 22–32)
Calcium: 9.2 mg/dL (ref 8.9–10.3)
Chloride: 94 mmol/L — ABNORMAL LOW (ref 98–111)
Creatinine, Ser: 0.42 mg/dL — ABNORMAL LOW (ref 0.44–1.00)
GFR, Estimated: >60 mL/min (ref >60)
Glucose, Bld: 146 mg/dL — ABNORMAL HIGH (ref 70–99)
Potassium: 3.2 mmol/L — ABNORMAL LOW (ref 3.5–5.1)
Sodium: 129 mmol/L — ABNORMAL LOW (ref 135–145)

## 2021-01-17 LAB — GLUCOSE, CAPILLARY
Glucose-Capillary: 151 mg/dL — ABNORMAL HIGH (ref 70–99)
Glucose-Capillary: 153 mg/dL — ABNORMAL HIGH (ref 70–99)
Glucose-Capillary: 163 mg/dL — ABNORMAL HIGH (ref 70–99)
Glucose-Capillary: 97 mg/dL (ref 70–99)

## 2021-01-17 LAB — TROPONIN I (HIGH SENSITIVITY)
Troponin I (High Sensitivity): 33 ng/L — ABNORMAL HIGH (ref <18)
Troponin I (High Sensitivity): 41 ng/L — ABNORMAL HIGH (ref <18)

## 2021-01-17 LAB — SURGICAL PCR SCREEN
MRSA, PCR: NEGATIVE
Staphylococcus aureus: NEGATIVE

## 2021-01-17 MED ORDER — INSULIN ASPART 100 UNIT/ML IJ SOLN
0.0000 [IU] | Freq: Three times a day (TID) | INTRAMUSCULAR | Status: DC
  Administered 2021-01-17 (×2): 1 [IU] via SUBCUTANEOUS

## 2021-01-17 MED ORDER — SODIUM CHLORIDE 0.9 % IV BOLUS
500.0000 mL | Freq: Once | INTRAVENOUS | Status: AC
  Administered 2021-01-17: 500 mL via INTRAVENOUS

## 2021-01-17 MED ORDER — PROMETHAZINE-CODEINE 6.25-10 MG/5ML PO SYRP: 5.0000 mL | ORAL_SOLUTION | Freq: Four times a day (QID) | ORAL | Status: DC | PRN

## 2021-01-17 MED ORDER — POTASSIUM CHLORIDE CRYS ER 20 MEQ PO TBCR
40.0000 meq | EXTENDED_RELEASE_TABLET | Freq: Once | ORAL | Status: AC
  Administered 2021-01-17: 40 meq via ORAL
  Filled 2021-01-17: qty 2

## 2021-01-17 MED ORDER — BENZONATATE 100 MG PO CAPS: 100.0000 mg | ORAL_CAPSULE | Freq: Three times a day (TID) | ORAL | Status: DC | PRN

## 2021-01-17 MED ORDER — MUPIROCIN 2 % EX OINT
1.0000 "application " | TOPICAL_OINTMENT | Freq: Two times a day (BID) | CUTANEOUS | Status: DC
  Administered 2021-01-18 (×2): 1 via NASAL
  Filled 2021-01-17: qty 22

## 2021-01-17 NOTE — H&P (Signed)
History and Physical    Reise Hietala JSH:[id number] DOB: 24-Nov-1950 DOA: 01/16/2021  PCP: Michela Pitcher, NP  Patient coming from: Home  I have personally briefly reviewed patient's old medical records in [place]  Chief Complaint: Wheezing  HPI: Christy Kelley is a 71 y.o. female with no significant past medical history who presents with concerns of wheezing.  Has noted wheezing for the past 4 days each time that she smoked.  Reports smoking on and off for about 20 years half a pack a day.  Has cough but no shortness of breath.  Denies any chest pain or tightness.  No nausea, vomiting or diarrhea.  No history of surgery.  She recently had right sided pneumonia in April that was treated with antibiotics.   ED Course: She was afebrile, tachycardic to 110s, tachypneic to 24 and hypoxic down to 90% and placed on 2 L.  No leukocytosis or anemia.  Sodium of 127, potassium of 3.5, creatinine of 0.61, BG of 143.  Initial troponin of 27 increased to 35.  CTA of the chest was negative for pulmonary embolism but had findings of a large lung mass with occlusion of the distal right main bronchi and right lower and middle lobe bronchi.     Review of Systems:  Constitutional: No Weight Change, No Fever ENT/Mouth: No sore throat, No Rhinorrhea Eyes: No Eye Pain, No Vision Changes Cardiovascular: No Chest Pain, no SOB Respiratory: +Cough, No Sputum Gastrointestinal: No Nausea, No Vomiting, No Diarrhea, No Pain Genitourinary: no Urinary Incontinence Musculoskeletal: No Arthralgias, No Myalgias Skin: No Skin Lesions, No Pruritus, Neuro: no Weakness, No Numbness Psych: No Anxiety/Panic, No Depression, no decrease appetite Heme/Lymph: No Bruising, No Bleeding   Surgical hx No surgeries   Social History Pt is retired. Lives with her husband. Smokes 1/2 per day for about 20 years.  No alcohol or drug use  No Known Allergies  Family History  Problem Relation Age of Onset    Arthritis/Rheumatoid Mother    Diabetes Father    Cancer Father      Prior to Admission medications   Medication Sig Start Date End Date Taking? Authorizing Provider  Ascorbic Acid (VITAMIN C PO) Take 1,000 mg by mouth daily.   Yes [provider]  meloxicam (MOBIC) 15 MG tablet Take 1 tablet (15 mg total) by mouth daily. Patient taking differently: Take 15 mg by mouth every other day. 11/23/20  Yes Michela Pitcher, NP  Multiple Vitamin (MULTIVITAMIN) tablet Take 1 tablet by mouth daily.   Yes [provider]  Vitamin D, Cholecalciferol, 25 MCG (1000 UT) TABS Take 1,000 Units by mouth daily.   Yes [provider]    Physical Exam: Vitals:   01/16/21 2134 01/16/21 2229 01/16/21 2330 01/17/21 0056  BP: (!) 157/91 (!) 144/79 132/84 (!) 131/92  Pulse: 86 86 100 (!) 101  Resp: 20 20 20 16   Temp:    97.8 F (36.6 C)  TempSrc:    Oral  SpO2: 94% 99% 90% 94%    Constitutional: NAD, calm, comfortable, thin elderly female lying flat in bed Vitals:   01/16/21 2134 01/16/21 2229 01/16/21 2330 01/17/21 0056  BP: (!) 157/91 (!) 144/79 132/84 (!) 131/92  Pulse: 86 86 100 (!) 101  Resp: 20 20 20 16   Temp:    97.8 F (36.6 C)  TempSrc:    Oral  SpO2: 94% 99% 90% 94%   Eyes: PERRL, lids and conjunctivae normal ENMT: Mucous membranes are  moist. Posterior pharynx clear of any exudate or lesions.Normal dentition.  Neck: normal, supple Respiratory: clear to auscultation bilaterally, no wheezing, no crackles. Normal respiratory effort on 2L via [place]. No accessory muscle use.  Able to speak in full sentences Cardiovascular: Regular rate and rhythm, no murmurs / rubs / gallops. No extremity edema.  Abdomen: no tenderness, no masses palpated.  Bowel sounds positive.  Musculoskeletal: no clubbing / cyanosis. No joint deformity upper and lower extremities. Good ROM, no contractures. Normal muscle tone.  Skin: no rashes, lesions, ulcers. No induration Neurologic: CN 2-12  grossly intact. Sensation intact, Strength 5/5 in all 4.  Psychiatric: Normal judgment and insight. Alert and oriented x 3. Normal mood.     Labs on Admission: I have personally reviewed following labs and imaging studies  CBC: Recent Labs  Lab 01/16/21 1713  WBC 7.0  HGB 13.0  HCT 37.6  MCV 93.1  PLT 161   Basic Metabolic Panel: Recent Labs  Lab 01/16/21 1713  NA 127*  K 3.5  CL 91*  CO2 25  GLUCOSE 143*  BUN 13  CREATININE 0.61  CALCIUM 9.2   GFR: CrCl cannot be calculated (Unknown ideal weight.). Liver Function Tests: No results for input(s): AST, ALT, ALKPHOS, BILITOT, PROT, ALBUMIN in the last 168 hours. No results for input(s): LIPASE, AMYLASE in the last 168 hours. No results for input(s): AMMONIA in the last 168 hours. Coagulation Profile: No results for input(s): INR, PROTIME in the last 168 hours. Cardiac Enzymes: No results for input(s): CKTOTAL, CKMB, CKMBINDEX, TROPONINI in the last 168 hours. BNP (last 3 results) No results for input(s): PROBNP in the last 8760 hours. HbA1C: No results for input(s): HGBA1C in the last 72 hours. CBG: No results for input(s): GLUCAP in the last 168 hours. Lipid Profile: No results for input(s): CHOL, HDL, LDLCALC, TRIG, CHOLHDL, LDLDIRECT in the last 72 hours. Thyroid Function Tests: No results for input(s): TSH, T4TOTAL, FREET4, T3FREE, THYROIDAB in the last 72 hours. Anemia Panel: No results for input(s): VITAMINB12, FOLATE, FERRITIN, TIBC, IRON, RETICCTPCT in the last 72 hours. Urine analysis:    Component Value Date/Time   COLORURINE COLORLESS (A) 07/27/2020 1907   APPEARANCEUR CLEAR 07/27/2020 1907   LABSPEC 1.000 (L) 07/27/2020 1907   PHURINE 6.0 07/27/2020 1907   GLUCOSEU NEGATIVE 07/27/2020 1907   HGBUR SMALL (A) 07/27/2020 [place] NEGATIVE 07/27/2020 [place] NEGATIVE 07/27/2020 1907   PROTEINUR NEGATIVE 07/27/2020 1907   NITRITE NEGATIVE 07/27/2020 1907   LEUKOCYTESUR NEGATIVE  07/27/2020 1907    Radiological Exams on Admission: DG Chest 2 View  Result Date: 01/16/2021 CLINICAL DATA:  Abnormal chest x-ray. EXAM: CHEST - 2 VIEW COMPARISON:  11/23/2020 FINDINGS: Persistent linear densities in the right perihilar region extending to the periphery of the right lung. These linear densities have decreased since 11/23/2020. However, there may be slightly increased fullness in the right hilum. Remainder of the lungs are clear. Again noted is tenting of the right hemidiaphragm. Heart and mediastinum are within normal limits. No pleural effusions. No acute bone abnormality. IMPRESSION: 1. Linear densities in the right chest have decreased and suggestive for resolving infectious or inflammatory process. However, there may be slightly increased fullness in the right hilum which could be related to the projection of the frontal image since minimal change on the lateral view. Recommend continued follow-up to evaluate this area. 2. No acute chest abnormality. Electronically Signed   By: Markus Daft M.D.   On: 01/16/2021  15:33   CT Angio Chest Pulmonary Embolism (PE) W or WO Contrast  Result Date: 01/16/2021 CLINICAL DATA:  Shortness of breath and wheezing dry cough EXAM: CT ANGIOGRAPHY CHEST WITH CONTRAST TECHNIQUE: Multidetector CT imaging of the chest was performed using the standard protocol during bolus administration of intravenous contrast. Multiplanar CT image reconstructions and MIPs were obtained to evaluate the vascular anatomy. CONTRAST:  68mL OMNIPAQUE IOHEXOL 350 MG/ML SOLN COMPARISON:  Chest x-ray 01/16/2021, CT chest 07/27/2020 FINDINGS: Cardiovascular: Satisfactory opacification of the pulmonary arteries to the segmental level. No evidence of pulmonary embolism. Narrowing of right upper lobe and distal right pulmonary artery by lobulated soft tissue mass. Mild aortic atherosclerosis. No aneurysm. Normal cardiac size. No pericardial effusion. Coronary calcifications and mitral  calcifications. Mediastinum/Nodes: Midline trachea. No thyroid mass. Right paratracheal node measures 16 mm. Precarinal lymph node measures 15 mm. Bulky right hilar mass measuring 4.7 by 4.2 by 3.7 cm approximately. Occlusion of the right main bronchus and proximal right lower lobe and middle lobe bronchi 6 with fluid or debris in the right middle and lower lobe bronchi. Lungs/Pleura: Hyperlucent right upper, middle and lower lobes. Largely cleared right upper lobe pneumonia. Peripheral bandlike density in the right upper lobe most likely scarring. Upper Abdomen: Subcentimeter hypodensity in the left hepatic lobe. No acute abnormality. Musculoskeletal: No acute or suspicious osseous abnormality. Review of the MIP images confirms the above findings. IMPRESSION: 1. Negative for acute pulmonary embolism. 2. Interval finding of bulky right hilar mass/neoplasm with narrowing of the distal right pulmonary artery and right upper lobe pulmonary vessels. Occlusion of distal right main bronchus and right lower and middle lobe bronchi by mass lesion. Fluid and or debris within distal right lower and middle lobe bronchi. There are multiple enlarged mediastinal lymph nodes. 3. Largely cleared right upper lobe pneumonia with peripheral bandlike density in the right upper lobe likely scarring. Aortic Atherosclerosis (ICD10-I70.0). Electronically Signed   By: Donavan Foil M.D.   On: 01/16/2021 23:22      Assessment/Plan  Acute hypoxia secondary to obstructing lung mass -Admitted on 2 L.  Treatment as below  Bulky right hilar mass likely malignancy in the setting of chronic tobacco use -CTA chest negative for PE but showed occlusion of distal right main bronchus and right lower and middle lobe bronchi by mass lesion with multiple enlarged mediastinal lymph nodes -Consult oncology in the morning and will need biopsy of lung mass -Kept n.p.o. -CT head ordered by EDP is pending to assess for metastasis  Hyponatremia -  Possible hypovolemic vs SIADH with new lung mass - Give 500 cc normal saline bolus - Repeat sodium in the morning  Elevated troponin - Suspect due to malignancy  Mild hyperglycemia - BG of 143 - Placed on very sensitive sliding scale.  Check hemoglobin A1c  DVT prophylaxis:.Lovenox Code Status: Full Family Communication: Plan discussed with patient at bedside  disposition Plan: Home with at least 2 midnight stays  Consults called:  Admission status: inpatient  Level of care: Progressive  Status is: Inpatient  Remains inpatient appropriate because:Inpatient level of care appropriate due to severity of illness  Dispo: The patient is from: Home              Anticipated d/c is to: Home              Patient currently is not medically stable to d/c.   Difficult to place patient No         Orene Desanctis DO  [facility]   If 7PM-7AM, please contact night-coverage [email or web address]   01/17/2021, 1:26 AM

## 2021-01-17 NOTE — Consult Note (Signed)
NAME:  Christy Kelley, MRN:  [id number], DOB:  1950-10-20, LOS: 0 ADMISSION DATE:  01/16/2021, CONSULTATION DATE:  01/17/21 REFERRING MD:  Dr. Cruzita Lederer, CHIEF COMPLAINT:  Lung Mass   History of Present Illness:   70 year old female with history of smoking and COVID pneumonia (4/22) who presented to [facility] ER on 9/27 with complaints of wheezing and shortness of breath.   Patient reports no past medical history.  She is seen on a regular basis by her PCP, Karl Ito, NP.  She is retired from [employer] and lives at home with her husband.  She has smoked cigarettes for at least 20 years, currently at 0.5 ppd and 1ppd at her heaviest; she currently has no interest in quitting.  Reports having COVID pneumonia in April 2022 despite triple vaccination and ongoing brain fog.  She reports she developed wheezing several weeks ago and has been progressively worse.  Also has had an unintentional weight loss of 10 lbs since April.   Denies any fever, chills, productive cough, or nausea/ vomiting.  In ER, she was afebrile, slightly hypertensive, intermittently tachycardic, and lowest oxygen saturation at 90% improved on 2L [place].  Labs noted for Na 127, Cl 91, glucose 143, trop hs trend 27-35-41-33, normal CBC, flu/ covid negative, CXR showing resolving linear densities in right chest with increased fullness in the right hilum.  CTA PE was negative for PE, but showed bulky right hilar mass with narrowing of right distal pulmonary artery and RUL pulmonary vessels, with occlusion of distal right main bronchus, RLL and RML bronchi; multiple enlarged mediastinal lymph nodes, and scarring of RUL from previous pneumonia/ COVID in April.  Of  note, on April 2022 CT chest, noted to have mild pretracheal and right hilar lymphadenopathy, thought reactive given RUL pneumonia.  CT head negative acute process or obvious mass; recommended MRI to detect small intracranial metastasis.  She was admitted to [facility] and Pulmonary  consulted given lung mass.   Pertinent  Medical History  Tobacco abuse Covid and pneumonia 07/2020  Significant Hospital Events: Including procedures, antibiotic start and stop dates in addition to other pertinent events   9/28 admitted to [facility]   Interim History / Subjective:  Currently denies any SOB.  Has not had any further wheezing.   Objective   Blood pressure (!) 135/94, pulse (!) 104, temperature 98.2 F (36.8 C), temperature source Oral, resp. rate 20, SpO2 92 %.        Intake/Output Summary (Last 24 hours) at 01/17/2021 1350 Last data filed at 01/17/2021 0400 Gross per 24 hour  Intake 500 ml  Output --  Net 500 ml   There were no vitals filed for this visit.  Examination: General:  Thin older female lying in bed in NAD Neuro:  Alert, oriented, MAE CV: rr PULM:  non labored, clear anteriorly, diminished in R base > L base, no wheezing. GI: soft, bs+  Extremities: warm/dry, no LE edema   Resolved Hospital Problem list    Assessment & Plan:   Bulky right hilar mass with occlusion of distal right main bronchus and RLL and RML bronchi with mediastinal lymphadenopathy highly concerning for malignancy given smoking history, weight loss, hyponatremia, and fairly rapid growth of mass from prior chest CT in April (especially SCLC). Tobacco abuse  - plan for EBUS 9/29 at 130pm with Dr. Carlis Abbott  - NPO after midnight  - once pathology results obtained, can consult oncology  - patient currently has no interest in  smoking cessation - prn BD   Pulmonary will continue to follow.   Labs   CBC: Recent Labs  Lab 01/16/21 1713  WBC 7.0  HGB 13.0  HCT 37.6  MCV 93.1  PLT 320    Basic Metabolic Panel: Recent Labs  Lab 01/16/21 1713 01/17/21 0149  NA 127* 129*  K 3.5 3.2*  CL 91* 94*  CO2 25 25  GLUCOSE 143* 146*  BUN 13 9  CREATININE 0.61 0.42*  CALCIUM 9.2 9.2   GFR: CrCl cannot be calculated (Unknown ideal weight.). Recent Labs  Lab 01/16/21 1713  WBC  7.0    Liver Function Tests: No results for input(s): AST, ALT, ALKPHOS, BILITOT, PROT, ALBUMIN in the last 168 hours. No results for input(s): LIPASE, AMYLASE in the last 168 hours. No results for input(s): AMMONIA in the last 168 hours.  ABG No results found for: PHART, PCO2ART, PO2ART, HCO3, TCO2, ACIDBASEDEF, O2SAT   Coagulation Profile: No results for input(s): INR, PROTIME in the last 168 hours.  Cardiac Enzymes: No results for input(s): CKTOTAL, CKMB, CKMBINDEX, TROPONINI in the last 168 hours.  HbA1C: No results found for: HGBA1C  CBG: Recent Labs  Lab 01/17/21 0839 01/17/21 1204  GLUCAP 151* 153*    Review of Systems:   Review of Systems  Constitutional:  Positive for weight loss. Negative for chills and fever.  Respiratory:  Positive for wheezing. Negative for cough, hemoptysis, sputum production and shortness of breath.   Cardiovascular:  Negative for chest pain, orthopnea and leg swelling.  Gastrointestinal:  Negative for abdominal pain, nausea and vomiting.  Neurological:  Negative for dizziness, loss of consciousness and weakness.   Past Medical History:  She,  has no past medical history on file.   Surgical History:  History reviewed. No pertinent surgical history.   Social History:   reports that she quit smoking about 4 months ago. Her smoking use included cigarettes. She has a 8.75 pack-year smoking history. She has never used smokeless tobacco. She reports current alcohol use. She reports that she does not use drugs.   Family History:  Her family history includes Arthritis/Rheumatoid in her mother; Cancer in her father; Diabetes in her father.   Allergies No Known Allergies   Home Medications  Prior to Admission medications   Medication Sig Start Date End Date Taking? Authorizing Provider  Ascorbic Acid (VITAMIN C PO) Take 1,000 mg by mouth daily.   Yes [provider]  meloxicam (MOBIC) 15 MG tablet Take 1 tablet (15 mg total) by  mouth daily. Patient taking differently: Take 15 mg by mouth every other day. 11/23/20  Yes Michela Pitcher, NP  Multiple Vitamin (MULTIVITAMIN) tablet Take 1 tablet by mouth daily.   Yes [provider]  Vitamin D, Cholecalciferol, 25 MCG (1000 UT) TABS Take 1,000 Units by mouth daily.   Yes [provider]     Critical care time: n/a     Kennieth Rad, ACNP [facility] 01/17/2021, 3:21 PM  See Amion for pager If no response to pager, please call PCCM consult pager After 7:00 pm call Elink

## 2021-01-17 NOTE — Progress Notes (Signed)
Patient seen and examined this morning, admitted overnight with wheezing or shortness of breath, was found to have a lung mass with occlusion of the distal right main bronchi and right lower and middle lobe bronchi.  Pulmonology consulted today to evaluate from bronc/biopsy, awaiting input regarding timing  Kathye Cipriani M. Cruzita Lederer, MD, PhD [facility]  Between 7 am - 7 pm you can contact me via Amion (for emergencies) or [place] (non urgent matters).  I am not available 7 pm - 7 am, please contact night coverage MD/APP via Amion

## 2021-01-17 NOTE — H&P (View-Only) (Signed)
NAME:  Christy Kelley, MRN:  [id number], DOB:  02/25/1951, LOS: 0 ADMISSION DATE:  01/16/2021, CONSULTATION DATE:  01/17/21 REFERRING MD:  Dr. Cruzita Lederer, CHIEF COMPLAINT:  Lung Mass   History of Present Illness:   70 year old female with history of smoking and COVID pneumonia (4/22) who presented to [facility] ER on 9/27 with complaints of wheezing and shortness of breath.   Patient reports no past medical history.  She is seen on a regular basis by her PCP, Karl Ito, NP.  She is retired from [employer] and lives at home with her husband.  She has smoked cigarettes for at least 20 years, currently at 0.5 ppd and 1ppd at her heaviest; she currently has no interest in quitting.  Reports having COVID pneumonia in April 2022 despite triple vaccination and ongoing brain fog.  She reports she developed wheezing several weeks ago and has been progressively worse.  Also has had an unintentional weight loss of 10 lbs since April.   Denies any fever, chills, productive cough, or nausea/ vomiting.  In ER, she was afebrile, slightly hypertensive, intermittently tachycardic, and lowest oxygen saturation at 90% improved on 2L .  Labs noted for Na 127, Cl 91, glucose 143, trop hs trend 27-35-41-33, normal CBC, flu/ covid negative, CXR showing resolving linear densities in right chest with increased fullness in the right hilum.  CTA PE was negative for PE, but showed bulky right hilar mass with narrowing of right distal pulmonary artery and RUL pulmonary vessels, with occlusion of distal right main bronchus, RLL and RML bronchi; multiple enlarged mediastinal lymph nodes, and scarring of RUL from previous pneumonia/ COVID in April.  Of  note, on April 2022 CT chest, noted to have mild pretracheal and right hilar lymphadenopathy, thought reactive given RUL pneumonia.  CT head negative acute process or obvious mass; recommended MRI to detect small intracranial metastasis.  She was admitted to [facility] and Pulmonary  consulted given lung mass.   Pertinent  Medical History  Tobacco abuse Covid and pneumonia 07/2020  Significant Hospital Events: Including procedures, antibiotic start and stop dates in addition to other pertinent events   9/28 admitted to [facility]   Interim History / Subjective:  Currently denies any SOB.  Has not had any further wheezing.   Objective   Blood pressure (!) 135/94, pulse (!) 104, temperature 98.2 F (36.8 C), temperature source Oral, resp. rate 20, SpO2 92 %.        Intake/Output Summary (Last 24 hours) at 01/17/2021 1350 Last data filed at 01/17/2021 0400 Gross per 24 hour  Intake 500 ml  Output --  Net 500 ml   There were no vitals filed for this visit.  Examination: General:  Thin older female lying in bed in NAD Neuro:  Alert, oriented, MAE CV: rr PULM:  non labored, clear anteriorly, diminished in R base > L base, no wheezing. GI: soft, bs+  Extremities: warm/dry, no LE edema   Resolved Hospital Problem list    Assessment & Plan:   Bulky right hilar mass with occlusion of distal right main bronchus and RLL and RML bronchi with mediastinal lymphadenopathy highly concerning for malignancy given smoking history, weight loss, hyponatremia, and fairly rapid growth of mass from prior chest CT in April (especially SCLC). Tobacco abuse  - plan for EBUS 9/29 at 130pm with Dr. Carlis Abbott  - NPO after midnight  - once pathology results obtained, can consult oncology  - patient currently has no interest in  smoking cessation - prn BD   Pulmonary will continue to follow.   Labs   CBC: Recent Labs  Lab 01/16/21 1713  WBC 7.0  HGB 13.0  HCT 37.6  MCV 93.1  PLT 267    Basic Metabolic Panel: Recent Labs  Lab 01/16/21 1713 01/17/21 0149  NA 127* 129*  K 3.5 3.2*  CL 91* 94*  CO2 25 25  GLUCOSE 143* 146*  BUN 13 9  CREATININE 0.61 0.42*  CALCIUM 9.2 9.2   GFR: CrCl cannot be calculated (Unknown ideal weight.). Recent Labs  Lab 01/16/21 1713  WBC  7.0    Liver Function Tests: No results for input(s): AST, ALT, ALKPHOS, BILITOT, PROT, ALBUMIN in the last 168 hours. No results for input(s): LIPASE, AMYLASE in the last 168 hours. No results for input(s): AMMONIA in the last 168 hours.  ABG No results found for: PHART, PCO2ART, PO2ART, HCO3, TCO2, ACIDBASEDEF, O2SAT   Coagulation Profile: No results for input(s): INR, PROTIME in the last 168 hours.  Cardiac Enzymes: No results for input(s): CKTOTAL, CKMB, CKMBINDEX, TROPONINI in the last 168 hours.  HbA1C: No results found for: HGBA1C  CBG: Recent Labs  Lab 01/17/21 0839 01/17/21 1204  GLUCAP 151* 153*    Review of Systems:   Review of Systems  Constitutional:  Positive for weight loss. Negative for chills and fever.  Respiratory:  Positive for wheezing. Negative for cough, hemoptysis, sputum production and shortness of breath.   Cardiovascular:  Negative for chest pain, orthopnea and leg swelling.  Gastrointestinal:  Negative for abdominal pain, nausea and vomiting.  Neurological:  Negative for dizziness, loss of consciousness and weakness.   Past Medical History:  She,  has no past medical history on file.   Surgical History:  History reviewed. No pertinent surgical history.   Social History:   reports that she quit smoking about 4 months ago. Her smoking use included cigarettes. She has a 8.75 pack-year smoking history. She has never used smokeless tobacco. She reports current alcohol use. She reports that she does not use drugs.   Family History:  Her family history includes Arthritis/Rheumatoid in her mother; Cancer in her father; Diabetes in her father.   Allergies No Known Allergies   Home Medications  Prior to Admission medications   Medication Sig Start Date End Date Taking? Authorizing Provider  Ascorbic Acid (VITAMIN C PO) Take 1,000 mg by mouth daily.   Yes [provider]  meloxicam (MOBIC) 15 MG tablet Take 1 tablet (15 mg total) by  mouth daily. Patient taking differently: Take 15 mg by mouth every other day. 11/23/20  Yes Michela Pitcher, NP  Multiple Vitamin (MULTIVITAMIN) tablet Take 1 tablet by mouth daily.   Yes [provider]  Vitamin D, Cholecalciferol, 25 MCG (1000 UT) TABS Take 1,000 Units by mouth daily.   Yes [provider]     Critical care time: n/a     Kennieth Rad, ACNP [facility] 01/17/2021, 3:21 PM  See Amion for pager If no response to pager, please call PCCM consult pager After 7:00 pm call Elink

## 2021-01-17 NOTE — Plan of Care (Signed)
Pt is not in any pain. Skin is intact with no bruising or ecchymosis.

## 2021-01-18 ENCOUNTER — Inpatient Hospital Stay (HOSPITAL_COMMUNITY): Payer: Medicare HMO | Admitting: Anesthesiology

## 2021-01-18 ENCOUNTER — Inpatient Hospital Stay (HOSPITAL_COMMUNITY): Payer: Medicare HMO

## 2021-01-18 ENCOUNTER — Telehealth: Payer: Self-pay | Admitting: Critical Care Medicine

## 2021-01-18 ENCOUNTER — Encounter (HOSPITAL_COMMUNITY): Payer: Self-pay | Admitting: Family Medicine

## 2021-01-18 ENCOUNTER — Encounter (HOSPITAL_COMMUNITY)
Admission: EM | Disposition: A | Discharge status: Home / Self Care | Payer: Self-pay | Source: Home / Self Care | Attending: Internal Medicine

## 2021-01-18 DIAGNOSIS — R410 Disorientation, unspecified: Secondary | ICD-10-CM | POA: Diagnosis not present

## 2021-01-18 DIAGNOSIS — R590 Localized enlarged lymph nodes: Secondary | ICD-10-CM | POA: Diagnosis not present

## 2021-01-18 DIAGNOSIS — J9601 Acute respiratory failure with hypoxia: Secondary | ICD-10-CM | POA: Diagnosis not present

## 2021-01-18 DIAGNOSIS — R918 Other nonspecific abnormal finding of lung field: Secondary | ICD-10-CM | POA: Diagnosis not present

## 2021-01-18 DIAGNOSIS — R778 Other specified abnormalities of plasma proteins: Secondary | ICD-10-CM | POA: Diagnosis not present

## 2021-01-18 HISTORY — PX: ENDOBRONCHIAL ULTRASOUND: SHX5096

## 2021-01-18 HISTORY — PX: VIDEO BRONCHOSCOPY: SHX5072

## 2021-01-18 HISTORY — PX: BRONCHIAL WASHINGS: SHX5105

## 2021-01-18 HISTORY — PX: FINE NEEDLE ASPIRATION: SHX5430

## 2021-01-18 LAB — BASIC METABOLIC PANEL
Anion gap: 9 (ref 5–15)
BUN: 9 mg/dL (ref 8–23)
CO2: 26 mmol/L (ref 22–32)
Calcium: 8.9 mg/dL (ref 8.9–10.3)
Chloride: 94 mmol/L — ABNORMAL LOW (ref 98–111)
Creatinine, Ser: 0.5 mg/dL (ref 0.44–1.00)
GFR, Estimated: >60 mL/min (ref >60)
Glucose, Bld: 100 mg/dL — ABNORMAL HIGH (ref 70–99)
Potassium: 3.7 mmol/L (ref 3.5–5.1)
Sodium: 129 mmol/L — ABNORMAL LOW (ref 135–145)

## 2021-01-18 LAB — GLUCOSE, CAPILLARY
Glucose-Capillary: 104 mg/dL — ABNORMAL HIGH (ref 70–99)
Glucose-Capillary: 107 mg/dL — ABNORMAL HIGH (ref 70–99)
Glucose-Capillary: 117 mg/dL — ABNORMAL HIGH (ref 70–99)
Glucose-Capillary: 117 mg/dL — ABNORMAL HIGH (ref 70–99)

## 2021-01-18 LAB — HEMOGLOBIN A1C
Hgb A1c MFr Bld: 5.5 % (ref 4.8–5.6)
Mean Plasma Glucose: 111 mg/dL

## 2021-01-18 SURGERY — ENDOBRONCHIAL ULTRASOUND (EBUS)
Anesthesia: General

## 2021-01-18 MED ORDER — IPRATROPIUM-ALBUTEROL 0.5-2.5 (3) MG/3ML IN SOLN
3.0000 mL | RESPIRATORY_TRACT | Status: DC
  Administered 2021-01-18: 3 mL via RESPIRATORY_TRACT
  Filled 2021-01-18: qty 3

## 2021-01-18 MED ORDER — IPRATROPIUM-ALBUTEROL 0.5-2.5 (3) MG/3ML IN SOLN
3.0000 mL | Freq: Two times a day (BID) | RESPIRATORY_TRACT | Status: DC
  Administered 2021-01-19: 3 mL via RESPIRATORY_TRACT
  Filled 2021-01-18: qty 3

## 2021-01-18 MED ORDER — ROCURONIUM BROMIDE 10 MG/ML (PF) SYRINGE
PREFILLED_SYRINGE | INTRAVENOUS | Status: DC | PRN
  Administered 2021-01-18: 10 mg via INTRAVENOUS
  Administered 2021-01-18: 30 mg via INTRAVENOUS
  Administered 2021-01-18: 10 mg via INTRAVENOUS

## 2021-01-18 MED ORDER — PHENYLEPHRINE 40 MCG/ML (10ML) SYRINGE FOR IV PUSH (FOR BLOOD PRESSURE SUPPORT)
PREFILLED_SYRINGE | INTRAVENOUS | Status: DC | PRN
  Administered 2021-01-18 (×2): 120 ug via INTRAVENOUS
  Administered 2021-01-18: 80 ug via INTRAVENOUS

## 2021-01-18 MED ORDER — PROPOFOL 10 MG/ML IV BOLUS
INTRAVENOUS | Status: AC
  Filled 2021-01-18: qty 20

## 2021-01-18 MED ORDER — LIDOCAINE 2% (20 MG/ML) 5 ML SYRINGE
INTRAMUSCULAR | Status: DC | PRN
  Administered 2021-01-18: 60 mg via INTRAVENOUS

## 2021-01-18 MED ORDER — IPRATROPIUM-ALBUTEROL 0.5-2.5 (3) MG/3ML IN SOLN: 3.0000 mL | Freq: Once | RESPIRATORY_TRACT | Status: DC

## 2021-01-18 MED ORDER — ONDANSETRON HCL 4 MG/2ML IJ SOLN
INTRAMUSCULAR | Status: DC | PRN
  Administered 2021-01-18: 4 mg via INTRAVENOUS

## 2021-01-18 MED ORDER — PROPOFOL 1000 MG/100ML IV EMUL
INTRAVENOUS | Status: AC
  Filled 2021-01-18: qty 300

## 2021-01-18 MED ORDER — LACTATED RINGERS IV SOLN: INTRAVENOUS | Status: DC | PRN

## 2021-01-18 MED ORDER — DEXAMETHASONE SODIUM PHOSPHATE 10 MG/ML IJ SOLN
INTRAMUSCULAR | Status: DC | PRN
  Administered 2021-01-18: 4 mg via INTRAVENOUS

## 2021-01-18 MED ORDER — ESMOLOL HCL 100 MG/10ML IV SOLN
INTRAVENOUS | Status: DC | PRN
  Administered 2021-01-18: 30 mg via INTRAVENOUS
  Administered 2021-01-18: 20 mg via INTRAVENOUS

## 2021-01-18 MED ORDER — SUGAMMADEX SODIUM 200 MG/2ML IV SOLN
INTRAVENOUS | Status: DC | PRN
  Administered 2021-01-18: 150 mg via INTRAVENOUS

## 2021-01-18 MED ORDER — PROPOFOL 10 MG/ML IV BOLUS
INTRAVENOUS | Status: DC | PRN
  Administered 2021-01-18: 20 mg via INTRAVENOUS
  Administered 2021-01-18: 80 mg via INTRAVENOUS

## 2021-01-18 MED ORDER — ALBUTEROL SULFATE (2.5 MG/3ML) 0.083% IN NEBU
2.5000 mg | INHALATION_SOLUTION | Freq: Once | RESPIRATORY_TRACT | Status: AC
  Administered 2021-01-18: 2.5 mg via RESPIRATORY_TRACT

## 2021-01-18 MED ORDER — ALBUTEROL SULFATE (2.5 MG/3ML) 0.083% IN NEBU
INHALATION_SOLUTION | RESPIRATORY_TRACT | Status: AC
  Filled 2021-01-18: qty 3

## 2021-01-18 NOTE — Progress Notes (Signed)
Reexamined at bedside. Coughing better. Awake, alert, 87% on RA.   CXR without pneumothorax. Mild lobar collapse likely from airway occlusions during bronch with clot around her mass.  Discussed putting her back on 1-2L O2 overnight and likely she can go home tomorrow. Reg diet overnight.  Julian Hy, DO 01/18/21 5:44 PM [facility]

## 2021-01-18 NOTE — Telephone Encounter (Signed)
Follow up in 1-2 weeks requested. I will follow up pathology results with Adriena and her husband when they are available.  Julian Hy, DO 01/18/21 5:47 PM [facility]

## 2021-01-18 NOTE — Progress Notes (Signed)
PROGRESS NOTE  Christy Kelley OIZ:[id number] DOB: [birth year] DOA: 01/16/2021 PCP: Michela Pitcher, NP   LOS: 1 day   Brief Narrative / Interim history: 70 year old female with history of tobacco use, probable long COVID syndrome following infection earlier this year, admitted with wheezing, shortness of breath and was found to have a lung mass with occlusion of the right distal main bronchi and right lower and middle lobe bronchi.  Subjective / 24h Interval events: She is doing well this morning, awaiting bronchoscopy with biopsy  Assessment & Plan: Principal Problem Acute hypoxic respiratory failure secondary to obstructing lung mass-she was initially admitted on 2 L then weaned off to room air.  She underwent bronchoscopy today and upon waking up she has had increased wheezing and still requiring oxygen -Appreciate pulmonary help, will continue to monitor and hopefully home tomorrow if the wheezing or shortness of breath resolves -Pathology from the lung mass pending, will refer her with clot patient oncology  Active Problems Hyponatremia-probably related to her lung mass, stable  Elevated troponin-flat, not in a pattern consistent with ACS, likely demand  Scheduled Meds:  insulin aspart  0-6 Units Subcutaneous TID WC   ipratropium-albuterol  3 mL Nebulization Q4H   mupirocin ointment  1 application Nasal BID   Continuous Infusions: PRN Meds:.albuterol, benzonatate, promethazine-codeine  Diet Orders (From admission, onward)     Start     Ordered   01/18/21 0001  Diet NPO time specified  Diet effective midnight        01/17/21 1528            DVT prophylaxis: SCDs Start: 01/17/21 0132     Code Status: Full Code  Family Communication: Husband present at bedside  Status is: Inpatient  Remains inpatient appropriate because:Inpatient level of care appropriate due to severity of illness  Dispo: The patient is from: Home              Anticipated d/c is to: Home               Patient currently is not medically stable to d/c.   Difficult to place patient No   Level of care: Progressive  Consultants:  Pulmonary   Procedures:  Bronchoscopy/EBUS/biopsy  Microbiology  None  Antimicrobials: None   Objective: Vitals:   01/18/21 1600 01/18/21 1612 01/18/21 1620 01/18/21 1640  BP: (!) 161/80 (!) 154/95 (!) 149/74 (!) 128/92  Pulse: (!) 111 (!) 120 (!) 118 (!) 120  Resp: (!) 27 19 (!) 23   Temp:    97.8 F (36.6 C)  TempSrc:    Oral  SpO2: 92% 92% 91% (!) 89%  Weight:      Height:        Intake/Output Summary (Last 24 hours) at 01/18/2021 1643 Last data filed at 01/18/2021 1543 Gross per 24 hour  Intake 1300 ml  Output --  Net 1300 ml   Filed Weights   01/18/21 1225  Weight: 54.9 kg    Examination:  Constitutional: NAD Eyes: no scleral icterus ENMT: Mucous membranes are moist.  Neck: normal, supple Respiratory: clear to auscultation bilaterally, no wheezing, no crackles.  Cardiovascular: Regular rate and rhythm, no murmurs / rubs / gallops.  Abdomen: non distended, no tenderness. Bowel sounds positive.  Musculoskeletal: no clubbing / cyanosis.  Skin: no rashes Neurologic: CN 2-12 grossly intact. Strength 5/5 in all 4.  Psychiatric: Normal judgment and insight. Alert and oriented x 3. Normal mood.    Data Reviewed: I have independently reviewed  following labs and imaging studies   CBC: Recent Labs  Lab 01/16/21 1713  WBC 7.0  HGB 13.0  HCT 37.6  MCV 93.1  PLT 510   Basic Metabolic Panel: Recent Labs  Lab 01/16/21 1713 01/17/21 0149 01/18/21 0355  NA 127* 129* 129*  K 3.5 3.2* 3.7  CL 91* 94* 94*  CO2 25 25 26   GLUCOSE 143* 146* 100*  BUN 13 9 9   CREATININE 0.61 0.42* 0.50  CALCIUM 9.2 9.2 8.9   Liver Function Tests: No results for input(s): AST, ALT, ALKPHOS, BILITOT, PROT, ALBUMIN in the last 168 hours. Coagulation Profile: No results for input(s): INR, PROTIME in the last 168 hours. HbA1C: Recent Labs     01/17/21 0149  HGBA1C 5.5   CBG: Recent Labs  Lab 01/17/21 1204 01/17/21 1707 01/17/21 2127 01/18/21 0753 01/18/21 1149  GLUCAP 153* 163* 97 104* 107*    Recent Results (from the past 240 hour(s))  Resp Panel by RT-PCR (Flu A&B, Covid) Nasopharyngeal Swab     Status: None   Collection Time: 01/16/21  8:11 PM   Specimen: Nasopharyngeal Swab; Nasopharyngeal(NP) swabs in vial transport medium  Result Value Ref Range Status   SARS Coronavirus 2 by RT PCR NEGATIVE NEGATIVE Final    Comment: (NOTE) SARS-CoV-2 target nucleic acids are NOT DETECTED.  The SARS-CoV-2 RNA is generally detectable in upper respiratory specimens during the acute phase of infection. The lowest concentration of SARS-CoV-2 viral copies this assay can detect is 138 copies/mL. A negative result does not preclude SARS-Cov-2 infection and should not be used as the sole basis for treatment or other patient management decisions. A negative result may occur with  improper specimen collection/handling, submission of specimen other than nasopharyngeal swab, presence of viral mutation(s) within the areas targeted by this assay, and inadequate number of viral copies(<138 copies/mL). A negative result must be combined with clinical observations, patient history, and epidemiological information. The expected result is Negative.  Fact Sheet for Patients:  EntrepreneurPulse.com.au  Fact Sheet for Healthcare Providers:  IncredibleEmployment.be  This test is no t yet approved or cleared by the [place] FDA and  has been authorized for detection and/or diagnosis of SARS-CoV-2 by FDA under an Emergency Use Authorization (EUA). This EUA will remain  in effect (meaning this test can be used) for the duration of the COVID-19 declaration under Section 564(b)(1) of the Act, 21 U.S.C.section 360bbb-3(b)(1), unless the authorization is terminated  or revoked sooner.        Influenza A by PCR NEGATIVE NEGATIVE Final   Influenza B by PCR NEGATIVE NEGATIVE Final    Comment: (NOTE) The Xpert Xpress SARS-CoV-2/FLU/RSV plus assay is intended as an aid in the diagnosis of influenza from Nasopharyngeal swab specimens and should not be used as a sole basis for treatment. Nasal washings and aspirates are unacceptable for Xpert Xpress SARS-CoV-2/FLU/RSV testing.  Fact Sheet for Patients: EntrepreneurPulse.com.au  Fact Sheet for Healthcare Providers: IncredibleEmployment.be  This test is not yet approved or cleared by the [place] FDA and has been authorized for detection and/or diagnosis of SARS-CoV-2 by FDA under an Emergency Use Authorization (EUA). This EUA will remain in effect (meaning this test can be used) for the duration of the COVID-19 declaration under Section 564(b)(1) of the Act, 21 U.S.C. section 360bbb-3(b)(1), unless the authorization is terminated or revoked.  Performed at [facility], [place] [address] [place], [place] [address]   Surgical PCR screen     Status: None  Collection Time: 01/17/21  7:45 PM   Specimen: Nasal Mucosa; Nasal Swab  Result Value Ref Range Status   MRSA, PCR NEGATIVE NEGATIVE Final   Staphylococcus aureus NEGATIVE NEGATIVE Final    Comment: (NOTE) The Xpert SA Assay (FDA approved for NASAL specimens in patients 12 years of age and older), is one component of a comprehensive surveillance program. It is not intended to diagnose infection nor to guide or monitor treatment. Performed at [facility], [place] [address], [place], [place] [address]      Radiology Studies: DG CHEST PORT 1 VIEW  Result Date: 01/18/2021 CLINICAL DATA:  Bronchoscopy EXAM: PORTABLE CHEST 1 VIEW COMPARISON:  01/16/2021, CT 01/16/2021 FINDINGS: Linear atelectasis in the right mid lung. Right hilar mass redemonstrated. No pneumothorax. Stable cardiomediastinal  silhouette. No focal opacity. Possible tiny right pleural effusion IMPRESSION: 1. Right hilar mass redemonstrated. There is trace right effusion but no visible pneumothorax Electronically Signed   By: Donavan Foil M.D.   On: 01/18/2021 16:27    Marzetta Board, MD, PhD [facility]  Between 7 am - 7 pm I am available, please contact me via Amion (for emergencies) or Securechat (non urgent messages)  Between 7 pm - 7 am I am not available, please contact night coverage MD/APP via Amion

## 2021-01-18 NOTE — Transfer of Care (Signed)
Immediate Anesthesia Transfer of Care Note  Patient: Christy Kelley  Procedure(s) Performed: ENDOBRONCHIAL ULTRASOUND VIDEO BRONCHOSCOPY WITHOUT FLUORO FINE NEEDLE ASPIRATION (FNA) LINEAR BRONCHIAL WASHINGS  Patient Location: Endoscopy Unit  Anesthesia Type:General  Level of Consciousness: awake  Airway & Oxygen Therapy: Patient Spontanous Breathing and Patient connected to face mask oxygen  Post-op Assessment: Report given to RN and Post -op Vital signs reviewed and stable  Post vital signs: Reviewed and stable  Last Vitals:  Vitals Value Taken Time  BP 150/89 01/18/21 1540  Temp    Pulse 95 01/18/21 1543  Resp 22 01/18/21 1543  SpO2 98 % 01/18/21 1543  Vitals shown include unvalidated device data.  Last Pain:  Vitals:   01/18/21 1225  TempSrc: Oral  PainSc: 0-No pain         Complications: No notable events documented.

## 2021-01-18 NOTE — Op Note (Signed)
Video Bronchoscopy Procedure Note  Date of Operation: 01/18/2021  Pre-op Diagnosis: R lung mass, mediastinal adenoapthy  Post-op Diagnosis: Same  Surgeon: Julian Hy, DO  Assistants: none  Anesthesia: general anesthesia  Meds Given: see separate anesthesia documentation  Operation: Flexible video fiberoptic bronchoscopy and biopsies, EBUS with biopsies.  Estimated Blood Loss: 10 cc  Complications: none noted  Indications and History: Christy Kelley is 70 y.o. with history of tobacco abuse.  Recommendation was to perform video fiberoptic bronchoscopy with biopsies. The risks, benefits, complications, treatment options and expected outcomes were discussed with the patient.  The possibilities of pneumothorax, pneumonia, reaction to medication, pulmonary aspiration, perforation of a viscus, bleeding, failure to diagnose a condition and creating a complication requiring transfusion or operation were discussed with the patient who freely signed the consent.    Description of Procedure: The patient was seen in the Preoperative Area, was examined and was deemed appropriate to proceed.  The patient was taken to endoscopy, identified as Christy Kelley and the procedure verified as Flexible Video Fiberoptic Bronchoscopy and EBUS with biopsy.  A Time Out was held and the above information confirmed.   General anesthesia was initiated as indicated above per anesthesia. See separate documentation. The video fiberoptic bronchoscope was introduced via the ETT and a general inspection was performed which showed normal distal trachea, normal main carina. The R mainstem bronchus had a 90% occlusive mass. The mass was friable and bled with suctioning. Unable to assess RUL, BI, or RML, or RLL bronchi.  The L side was then inspected. The LLL, Lingular and LUL airways were normal. Mild thick secretions suctioned from left sided airways.  EBUS scope inserted, demonstrating normal 4L, 10L, 11L. Large station  7 node but large vesels coursing through, so biopsy was not attempted. 4R pretracheal node biopsied under EBUS guidance x 5 passes. R lung mass biopsied x 5 passes. Preliminary path not diagnostic, so endobronchial biopsies also performed with subsequent bronchial wash, which was sent for cytology. There was some initial moderate bleeding that stopped quickly after cold saline instilled. The patient tolerated the procedure well. The bronchoscope was removed. There were no obvious complications.   Samples: 1. FNA 4R 2. FNA R lung mass 3. Endobronchial biopsies R mainstem mass 4. Bronchial washings from R mainstem  Plans:  We will review the cytology & pathology results with the patient when they become available.  Outpatient followup will be arranged.  Julian Hy, DO 01/18/21 3:43 PM [facility]

## 2021-01-18 NOTE — Interval H&P Note (Signed)
History and Physical Interval Note:  01/18/2021 1:40 PM  Christy Kelley  has presented today for surgery, with the diagnosis of Right Lung Mass.  The various methods of treatment have been discussed with the patient and family. After consideration of risks, benefits and other options for treatment, the patient has consented to  Procedure(s): ENDOBRONCHIAL ULTRASOUND (N/A) VIDEO BRONCHOSCOPY WITHOUT FLUORO (N/A) as a surgical intervention.  The patient's history has been reviewed, patient examined, no change in status, stable for surgery.  I have reviewed the patient's chart and labs.  Questions were answered to the patient's satisfaction.    No changes in symptoms, NPO since midnight. Discussed the procedure again and questions answered. Consent signed and witnessed.  Julian Hy, DO 01/18/21 1:41 PM [facility]

## 2021-01-18 NOTE — Anesthesia Preprocedure Evaluation (Signed)
Anesthesia Evaluation  Patient identified by MRN, date of birth, ID band  Reviewed: Allergy & Precautions, NPO status , Patient's Chart, lab work & pertinent test results  Airway Mallampati: II  TM Distance: >3 FB Neck ROM: Full    Dental  (+) Dental Advisory Given   Pulmonary pneumonia, former smoker,    Pulmonary exam normal breath sounds clear to auscultation       Cardiovascular Normal cardiovascular exam+ Valvular Problems/Murmurs  Rhythm:Regular Rate:Normal     Neuro/Psych negative neurological ROS     GI/Hepatic negative GI ROS, Neg liver ROS,   Endo/Other  negative endocrine ROS  Renal/GU negative Renal ROS     Musculoskeletal negative musculoskeletal ROS (+)   Abdominal   Peds  Hematology negative hematology ROS (+)   Anesthesia Other Findings   Reproductive/Obstetrics                             Anesthesia Physical Anesthesia Plan  ASA: 3  Anesthesia Plan: General   Post-op Pain Management:    Induction: Intravenous  PONV Risk Score and Plan: 3 and Ondansetron, Dexamethasone, Treatment may vary due to age or medical condition and Midazolam  Airway Management Planned: Oral ETT  Additional Equipment: None  Intra-op Plan:   Post-operative Plan: Extubation in OR  Informed Consent: I have reviewed the patients History and Physical, chart, labs and discussed the procedure including the risks, benefits and alternatives for the proposed anesthesia with the patient or authorized representative who has indicated his/her understanding and acceptance.     Dental advisory given  Plan Discussed with: CRNA  Anesthesia Plan Comments:         Anesthesia Quick Evaluation

## 2021-01-18 NOTE — Anesthesia Procedure Notes (Signed)
Procedure Name: Intubation Date/Time: 01/18/2021 2:10 PM Performed by: Milford Cage, CRNA Pre-anesthesia Checklist: Patient identified, Emergency Drugs available, Suction available and Patient being monitored Patient Re-evaluated:Patient Re-evaluated prior to induction Oxygen Delivery Method: Circle system utilized Preoxygenation: Pre-oxygenation with 100% oxygen Induction Type: IV induction Ventilation: Mask ventilation without difficulty Laryngoscope Size: Miller and 2 Grade View: Grade I Tube type: Oral Tube size: 8.5 mm Number of attempts: 2 Airway Equipment and Method: Stylet Placement Confirmation: ETT inserted through vocal cords under direct vision, positive ETCO2 and breath sounds checked- equal and bilateral Secured at: 21 cm Tube secured with: Tape Dental Injury: Teeth and Oropharynx as per pre-operative assessment

## 2021-01-19 ENCOUNTER — Encounter (HOSPITAL_COMMUNITY): Payer: Self-pay | Admitting: Critical Care Medicine

## 2021-01-19 ENCOUNTER — Telehealth: Payer: Self-pay | Admitting: *Deleted

## 2021-01-19 DIAGNOSIS — J9601 Acute respiratory failure with hypoxia: Secondary | ICD-10-CM | POA: Diagnosis not present

## 2021-01-19 LAB — GLUCOSE, CAPILLARY: Glucose-Capillary: 109 mg/dL — ABNORMAL HIGH (ref 70–99)

## 2021-01-19 MED ORDER — ALBUTEROL SULFATE HFA 108 (90 BASE) MCG/ACT IN AERS
2.0000 | INHALATION_SPRAY | Freq: Four times a day (QID) | RESPIRATORY_TRACT | 1 refills | Status: DC | PRN
Start: 2021-01-19 — End: 2021-04-26

## 2021-01-19 NOTE — Care Management Important Message (Signed)
Medicare IM printed for Social Work at [employer] to give to the patient

## 2021-01-19 NOTE — Plan of Care (Signed)
  Problem: Education: Goal: Knowledge of General Education information will improve Description: Including pain rating scale, medication(s)/side effects and non-pharmacologic comfort measures Outcome: Completed/Met   Problem: Health Behavior/Discharge Planning: Goal: Ability to manage health-related needs will improve Outcome: Completed/Met   Problem: Clinical Measurements: Goal: Ability to maintain clinical measurements within normal limits will improve Outcome: Completed/Met   

## 2021-01-19 NOTE — Anesthesia Postprocedure Evaluation (Signed)
Anesthesia Post Note  Patient: Christy Kelley  Procedure(s) Performed: ENDOBRONCHIAL ULTRASOUND VIDEO BRONCHOSCOPY WITHOUT FLUORO FINE NEEDLE ASPIRATION (FNA) LINEAR BRONCHIAL WASHINGS     Patient location during evaluation: PACU Anesthesia Type: General Level of consciousness: awake Pain management: pain level controlled Vital Signs Assessment: post-procedure vital signs reviewed and stable Respiratory status: spontaneous breathing, nonlabored ventilation, respiratory function stable and patient connected to nasal cannula oxygen Cardiovascular status: blood pressure returned to baseline and stable Postop Assessment: no apparent nausea or vomiting Anesthetic complications: no   No notable events documented.  Last Vitals:  Vitals:   01/18/21 2325 01/19/21 0420  BP: 111/64 124/83  Pulse: 100 81  Resp: 20 20  Temp: 37 C 37 C  SpO2: 93% 95%    Last Pain:  Vitals:   01/19/21 0745  TempSrc:   PainSc: 0-No pain                 Merlinda Frederick

## 2021-01-19 NOTE — Telephone Encounter (Signed)
Patient in hospital. Appt made.   Nothing further needed at this time.

## 2021-01-19 NOTE — Telephone Encounter (Signed)
I received referral today on Christy Kelley.  Per Dr. Julien Nordmann, he would like to see her next week. I called her to scheduled an appt but was unable to reach. I did leave vm message with my name and phone number to call.

## 2021-01-19 NOTE — Discharge Summary (Addendum)
Physician Discharge Summary  Christy Kelley HCW:[id number] DOB: 1950-11-02 DOA: 01/16/2021  PCP: Michela Pitcher, NP  Admit date: 01/16/2021 Discharge date: 01/19/2021  Admitted From: home Disposition:  home  Recommendations for Outpatient Follow-up:  Follow up with PCP in 1-2 weeks Please follow up on the following pending results: lung mass biopsy   Home Health: none Equipment/Devices: none  Discharge Condition: stable CODE STATUS: Full code Diet recommendation: regular  HPI: Per admitting MD, Christy Kelley is a 70 y.o. female with no significant past medical history who presents with concerns of wheezing. Has noted wheezing for the past 4 days each time that she smoked.  Reports smoking on and off for about 20 years half a pack a day.  Has cough but no shortness of breath.  Denies any chest pain or tightness.  No nausea, vomiting or diarrhea.  No history of surgery. She recently had right sided pneumonia in April that was treated with antibiotics.  Hospital Course / Discharge diagnoses: Principal Problem Acute hypoxic respiratory failure secondary to obstructing lung mass-she was initially admitted on 2 L then weaned off to room air.  She underwent bronchoscopy on 9/29 with plans to go home right afterwards but had increased wheezing and oxygen requirements and was monitored with an additional night.  She was eventually weaned off to room air, she is back to baseline and discharged home in stable condition.  Pulmonary will follow on biopsy results, oncology also made aware of patient's hospitalization to establish care and get follow-up   Active Problems Hyponatremia-probably related to her lung mass, stable Elevated troponin-flat, not in a pattern consistent with ACS, likely demand Tobacco use-counseled for cessation  Sepsis ruled out   Discharge Instructions   Allergies as of 01/19/2021   No Known Allergies      Medication List     TAKE these medications     albuterol 108 (90 Base) MCG/ACT inhaler Commonly known as: VENTOLIN HFA Inhale 2 puffs into the lungs every 6 (six) hours as needed for wheezing or shortness of breath.   meloxicam 15 MG tablet Commonly known as: MOBIC Take 1 tablet (15 mg total) by mouth daily. What changed: when to take this   multivitamin tablet Take 1 tablet by mouth daily.   VITAMIN C PO Take 1,000 mg by mouth daily.   Vitamin D (Cholecalciferol) 25 MCG (1000 UT) Tabs Take 1,000 Units by mouth daily.         Consultations: Pulmonary   Procedures/Studies: Bronch / EBUS / biopsy   DG Chest 2 View  Result Date: 01/16/2021 CLINICAL DATA:  Abnormal chest x-ray. EXAM: CHEST - 2 VIEW COMPARISON:  11/23/2020 FINDINGS: Persistent linear densities in the right perihilar region extending to the periphery of the right lung. These linear densities have decreased since 11/23/2020. However, there may be slightly increased fullness in the right hilum. Remainder of the lungs are clear. Again noted is tenting of the right hemidiaphragm. Heart and mediastinum are within normal limits. No pleural effusions. No acute bone abnormality. IMPRESSION: 1. Linear densities in the right chest have decreased and suggestive for resolving infectious or inflammatory process. However, there may be slightly increased fullness in the right hilum which could be related to the projection of the frontal image since minimal change on the lateral view. Recommend continued follow-up to evaluate this area. 2. No acute chest abnormality. Electronically Signed   By: Markus Daft M.D.   On: 01/16/2021 15:33   CT HEAD WO CONTRAST (5MM)  Result Date: 01/17/2021 CLINICAL DATA:  Pulmonary mass, altered mental status EXAM: CT HEAD WITHOUT CONTRAST TECHNIQUE: Contiguous axial images were obtained from the base of the skull through the vertex without intravenous contrast. COMPARISON:  07/27/2020 FINDINGS: Brain: Normal anatomic configuration. Parenchymal  volume loss is commensurate with the patient's age. Mild periventricular white matter changes are present likely reflecting the sequela of small vessel ischemia. No abnormal intra or extra-axial mass lesion or fluid collection. No abnormal mass effect or midline shift. No evidence of acute intracranial hemorrhage or infarct. Ventricular size is normal. Cerebellum unremarkable. Vascular: No asymmetric hyperdense vasculature at the skull base. Skull: Intact Sinuses/Orbits: Paranasal sinuses are clear. Orbits are unremarkable. Other: Mastoid air cells and middle ear cavities are clear. IMPRESSION: No acute intracranial abnormality. No evidence of intracranial metastatic disease. Note that MRI examination with contrast has better sensitivity for detection of small intracranial metastases. Mild senescent change. Electronically Signed   By: Fidela Salisbury M.D.   On: 01/17/2021 03:08   CT Angio Chest Pulmonary Embolism (PE) W or WO Contrast  Result Date: 01/16/2021 CLINICAL DATA:  Shortness of breath and wheezing dry cough EXAM: CT ANGIOGRAPHY CHEST WITH CONTRAST TECHNIQUE: Multidetector CT imaging of the chest was performed using the standard protocol during bolus administration of intravenous contrast. Multiplanar CT image reconstructions and MIPs were obtained to evaluate the vascular anatomy. CONTRAST:  10mL OMNIPAQUE IOHEXOL 350 MG/ML SOLN COMPARISON:  Chest x-ray 01/16/2021, CT chest 07/27/2020 FINDINGS: Cardiovascular: Satisfactory opacification of the pulmonary arteries to the segmental level. No evidence of pulmonary embolism. Narrowing of right upper lobe and distal right pulmonary artery by lobulated soft tissue mass. Mild aortic atherosclerosis. No aneurysm. Normal cardiac size. No pericardial effusion. Coronary calcifications and mitral calcifications. Mediastinum/Nodes: Midline trachea. No thyroid mass. Right paratracheal node measures 16 mm. Precarinal lymph node measures 15 mm. Bulky right hilar mass  measuring 4.7 by 4.2 by 3.7 cm approximately. Occlusion of the right main bronchus and proximal right lower lobe and middle lobe bronchi 6 with fluid or debris in the right middle and lower lobe bronchi. Lungs/Pleura: Hyperlucent right upper, middle and lower lobes. Largely cleared right upper lobe pneumonia. Peripheral bandlike density in the right upper lobe most likely scarring. Upper Abdomen: Subcentimeter hypodensity in the left hepatic lobe. No acute abnormality. Musculoskeletal: No acute or suspicious osseous abnormality. Review of the MIP images confirms the above findings. IMPRESSION: 1. Negative for acute pulmonary embolism. 2. Interval finding of bulky right hilar mass/neoplasm with narrowing of the distal right pulmonary artery and right upper lobe pulmonary vessels. Occlusion of distal right main bronchus and right lower and middle lobe bronchi by mass lesion. Fluid and or debris within distal right lower and middle lobe bronchi. There are multiple enlarged mediastinal lymph nodes. 3. Largely cleared right upper lobe pneumonia with peripheral bandlike density in the right upper lobe likely scarring. Aortic Atherosclerosis (ICD10-I70.0). Electronically Signed   By: Donavan Foil M.D.   On: 01/16/2021 23:22   DG CHEST PORT 1 VIEW  Result Date: 01/18/2021 CLINICAL DATA:  Bronchoscopy EXAM: PORTABLE CHEST 1 VIEW COMPARISON:  01/16/2021, CT 01/16/2021 FINDINGS: Linear atelectasis in the right mid lung. Right hilar mass redemonstrated. No pneumothorax. Stable cardiomediastinal silhouette. No focal opacity. Possible tiny right pleural effusion IMPRESSION: 1. Right hilar mass redemonstrated. There is trace right effusion but no visible pneumothorax Electronically Signed   By: Donavan Foil M.D.   On: 01/18/2021 16:27     Subjective: - no chest pain, shortness of breath,  no abdominal pain, nausea or vomiting.   Discharge Exam: BP 124/83 (BP Location: Left Arm)   Pulse 81   Temp 98.6 F (37 C)  (Oral)   Resp 20   Ht 5\' 3"  (1.6 m)   Wt 54.9 kg   LMP  (LMP Unknown)   SpO2 95%   BMI 21.43 kg/m   General: Pt is alert, awake, not in acute distress Cardiovascular: RRR, S1/S2 +, no rubs, no gallops Respiratory: CTA bilaterally, no wheezing, no rhonchi Abdominal: Soft, NT, ND, bowel sounds + Extremities: no edema, no cyanosis   The results of significant diagnostics from this hospitalization (including imaging, microbiology, ancillary and laboratory) are listed below for reference.     Microbiology: Recent Results (from the past 240 hour(s))  Resp Panel by RT-PCR (Flu A&B, Covid) Nasopharyngeal Swab     Status: None   Collection Time: 01/16/21  8:11 PM   Specimen: Nasopharyngeal Swab; Nasopharyngeal(NP) swabs in vial transport medium  Result Value Ref Range Status   SARS Coronavirus 2 by RT PCR NEGATIVE NEGATIVE Final    Comment: (NOTE) SARS-CoV-2 target nucleic acids are NOT DETECTED.  The SARS-CoV-2 RNA is generally detectable in upper respiratory specimens during the acute phase of infection. The lowest concentration of SARS-CoV-2 viral copies this assay can detect is 138 copies/mL. A negative result does not preclude SARS-Cov-2 infection and should not be used as the sole basis for treatment or other patient management decisions. A negative result may occur with  improper specimen collection/handling, submission of specimen other than nasopharyngeal swab, presence of viral mutation(s) within the areas targeted by this assay, and inadequate number of viral copies(<138 copies/mL). A negative result must be combined with clinical observations, patient history, and epidemiological information. The expected result is Negative.  Fact Sheet for Patients:  EntrepreneurPulse.com.au  Fact Sheet for Healthcare Providers:  IncredibleEmployment.be  This test is no t yet approved or cleared by the [place] FDA and  has been authorized  for detection and/or diagnosis of SARS-CoV-2 by FDA under an Emergency Use Authorization (EUA). This EUA will remain  in effect (meaning this test can be used) for the duration of the COVID-19 declaration under Section 564(b)(1) of the Act, 21 U.S.C.section 360bbb-3(b)(1), unless the authorization is terminated  or revoked sooner.       Influenza A by PCR NEGATIVE NEGATIVE Final   Influenza B by PCR NEGATIVE NEGATIVE Final    Comment: (NOTE) The Xpert Xpress SARS-CoV-2/FLU/RSV plus assay is intended as an aid in the diagnosis of influenza from Nasopharyngeal swab specimens and should not be used as a sole basis for treatment. Nasal washings and aspirates are unacceptable for Xpert Xpress SARS-CoV-2/FLU/RSV testing.  Fact Sheet for Patients: EntrepreneurPulse.com.au  Fact Sheet for Healthcare Providers: IncredibleEmployment.be  This test is not yet approved or cleared by the [place] FDA and has been authorized for detection and/or diagnosis of SARS-CoV-2 by FDA under an Emergency Use Authorization (EUA). This EUA will remain in effect (meaning this test can be used) for the duration of the COVID-19 declaration under Section 564(b)(1) of the Act, 21 U.S.C. section 360bbb-3(b)(1), unless the authorization is terminated or revoked.  Performed at [facility], [place] [address] [place], [place] [address]   Surgical PCR screen     Status: None   Collection Time: 01/17/21  7:45 PM   Specimen: Nasal Mucosa; Nasal Swab  Result Value Ref Range Status   MRSA, PCR NEGATIVE NEGATIVE Final   Staphylococcus aureus NEGATIVE  NEGATIVE Final    Comment: (NOTE) The Xpert SA Assay (FDA approved for NASAL specimens in patients 34 years of age and older), is one component of a comprehensive surveillance program. It is not intended to diagnose infection nor to guide or monitor treatment. Performed at [facility], [place] [address], [place], [place] [address]      Labs: Basic Metabolic Panel: Recent Labs  Lab 01/16/21 1713 01/17/21 0149 01/18/21 0355  NA 127* 129* 129*  K 3.5 3.2* 3.7  CL 91* 94* 94*  CO2 25 25 26   GLUCOSE 143* 146* 100*  BUN 13 9 9   CREATININE 0.61 0.42* 0.50  CALCIUM 9.2 9.2 8.9   Liver Function Tests: No results for input(s): AST, ALT, ALKPHOS, BILITOT, PROT, ALBUMIN in the last 168 hours. CBC: Recent Labs  Lab 01/16/21 1713  WBC 7.0  HGB 13.0  HCT 37.6  MCV 93.1  PLT 393   CBG: Recent Labs  Lab 01/17/21 2127 01/18/21 0753 01/18/21 1149 01/18/21 1727 01/18/21 2205  GLUCAP 97 104* 107* 117* 117*   Hgb A1c Recent Labs    01/17/21 0149  HGBA1C 5.5   Lipid Profile No results for input(s): CHOL, HDL, LDLCALC, TRIG, CHOLHDL, LDLDIRECT in the last 72 hours. Thyroid function studies No results for input(s): TSH, T4TOTAL, T3FREE, THYROIDAB in the last 72 hours.  Invalid input(s): FREET3 Urinalysis    Component Value Date/Time   COLORURINE COLORLESS (A) 07/27/2020 1907   APPEARANCEUR CLEAR 07/27/2020 1907   LABSPEC 1.000 (L) 07/27/2020 1907   PHURINE 6.0 07/27/2020 1907   GLUCOSEU NEGATIVE 07/27/2020 1907   HGBUR SMALL (A) 07/27/2020 [place] NEGATIVE 07/27/2020 [place] NEGATIVE 07/27/2020 1907   PROTEINUR NEGATIVE 07/27/2020 1907   NITRITE NEGATIVE 07/27/2020 1907   LEUKOCYTESUR NEGATIVE 07/27/2020 1907    FURTHER DISCHARGE INSTRUCTIONS:   Get Medicines reviewed and adjusted: Please take all your medications with you for your next visit with your Primary MD   Laboratory/radiological data: Please request your Primary MD to go over all hospital tests and procedure/radiological results at the follow up, please ask your Primary MD to get all Hospital records sent to his/her office.   In some cases, they will be blood work, cultures and biopsy results pending at the time of your discharge. Please request that your primary care  M.D. goes through all the records of your hospital data and follows up on these results.   Also Note the following: If you experience worsening of your admission symptoms, develop shortness of breath, life threatening emergency, suicidal or homicidal thoughts you must seek medical attention immediately by calling 911 or calling your MD immediately  if symptoms less severe.   You must read complete instructions/literature along with all the possible adverse reactions/side effects for all the Medicines you take and that have been prescribed to you. Take any new Medicines after you have completely understood and accpet all the possible adverse reactions/side effects.    Do not drive when taking Pain medications or sleeping medications (Benzodaizepines)   Do not take more than prescribed Pain, Sleep and Anxiety Medications. It is not advisable to combine anxiety,sleep and pain medications without talking with your primary care practitioner   Special Instructions: If you have smoked or chewed Tobacco  in the last 2 yrs please stop smoking, stop any regular Alcohol  and or any Recreational drug use.   Wear Seat belts while driving.   Please note: You were cared for  by a hospitalist during your hospital stay. Once you are discharged, your primary care physician will handle any further medical issues. Please note that NO REFILLS for any discharge medications will be authorized once you are discharged, as it is imperative that you return to your primary care physician (or establish a relationship with a primary care physician if you do not have one) for your post hospital discharge needs so that they can reassess your need for medications and monitor your lab values.  Time coordinating discharge: 35 minutes  SIGNED:  Marzetta Board, MD, PhD 01/19/2021, 7:37 AM

## 2021-01-22 ENCOUNTER — Telehealth: Payer: Self-pay | Admitting: Critical Care Medicine

## 2021-01-22 ENCOUNTER — Telehealth: Payer: Self-pay | Admitting: Student

## 2021-01-22 ENCOUNTER — Telehealth: Payer: Self-pay | Admitting: *Deleted

## 2021-01-22 DIAGNOSIS — C3490 Malignant neoplasm of unspecified part of unspecified bronchus or lung: Secondary | ICD-10-CM

## 2021-01-22 DIAGNOSIS — C3491 Malignant neoplasm of unspecified part of right bronchus or lung: Secondary | ICD-10-CM

## 2021-01-22 LAB — CYTOLOGY - NON PAP

## 2021-01-22 NOTE — Telephone Encounter (Signed)
I received referral on Ms. Christy Kelley.  I called to schedule but was unable to reach. I did leave vm message with my name and phone number to call.

## 2021-01-22 NOTE — Telephone Encounter (Signed)
Called and spoke with Patient's Husband Christy Kelley (DPR). Christy Kelley stated they wanted to know what kind or stage of cancer. Pet scan ordered for small lung cancer by Dr. Carlis Abbott.  Understanding stated.  Nothing further at this time.

## 2021-01-22 NOTE — Progress Notes (Signed)
Thoracic Location of Tumor / Histology: Right Lung Mass- [place]  Patient presented with wheezing, non-productive cough, and shortness of breath  PET 01/31/2021:  MRI Brain 01/29/2021:  Bronchoscopy 01/18/2021  CT Head 01/17/2021: No acute intracranial abnormality.  No evidence of intracranial metastatic disease.   CTA Chest 01/16/2021: Negative for acute pulmonary embolism. 2. Interval finding of bulky right hilar mass/neoplasm with narrowing of the distal right pulmonary artery and right upper lobe pulmonary vessels. Occlusion of distal right main bronchus and right lower and middle lobe bronchi by mass lesion. Fluid and or debris within distal right lower and middle lobe bronchi. There are multiple enlarged mediastinal lymph nodes.  Biopsies of Right Lung and Lymph Node 01/18/2021    Tobacco/Marijuana/Snuff/ETOH use: Former Smoker, quit 08/2020  Past/Anticipated interventions by cardiothoracic surgery, if any:   Past/Anticipated interventions by medical oncology, if any:  Dr. Julien Nordmann 01/24/2021   Signs/Symptoms Weight changes, if any: Steady until recent hospitalization, she has lost just a few pounds. Respiratory complaints, if any: Reports continued SOB more with increased activity, spouse reports she tires easily. Hemoptysis, if any: Denies hemoptysis.  She reports continues non-productive cough. Pain issues, if any: No   SAFETY ISSUES: Prior radiation? No Pacemaker/ICD? No Possible current pregnancy? Postmenopausal Is the patient on methotrexate? No  Current Complaints / other details:   -Covid (+) April 2022

## 2021-01-22 NOTE — Telephone Encounter (Signed)
I updated Mrs and Mr Dymond on her biopsy results. Unfortunately she has small cell lung cancer. Referrals to Oncology and Radiation Oncology, brain MRI, and PET scan have been ordered.   Julian Hy, DO 01/22/21 1:34 PM [facility]

## 2021-01-23 ENCOUNTER — Ambulatory Visit
Admission: RE | Admit: 2021-01-23 | Discharge: 2021-01-23 | Disposition: A | Discharge status: Home / Self Care | Payer: Medicare HMO | Source: Ambulatory Visit | Attending: Radiation Oncology | Admitting: Radiation Oncology

## 2021-01-23 ENCOUNTER — Telehealth: Payer: Self-pay | Admitting: *Deleted

## 2021-01-23 ENCOUNTER — Other Ambulatory Visit: Payer: Self-pay | Admitting: Radiation Oncology

## 2021-01-23 ENCOUNTER — Other Ambulatory Visit: Payer: Self-pay

## 2021-01-23 ENCOUNTER — Encounter: Payer: Self-pay | Admitting: Radiation Oncology

## 2021-01-23 VITALS — BP 126/103 | HR 108 | Temp 97.3°F | Resp 18 | Ht 63.0 in | Wt 122.6 lb

## 2021-01-23 DIAGNOSIS — C3491 Malignant neoplasm of unspecified part of right bronchus or lung: Secondary | ICD-10-CM

## 2021-01-23 DIAGNOSIS — Z791 Long term (current) use of non-steroidal anti-inflammatories (NSAID): Secondary | ICD-10-CM | POA: Insufficient documentation

## 2021-01-23 DIAGNOSIS — C3431 Malignant neoplasm of lower lobe, right bronchus or lung: Secondary | ICD-10-CM | POA: Insufficient documentation

## 2021-01-23 DIAGNOSIS — I700 Atherosclerosis of aorta: Secondary | ICD-10-CM | POA: Diagnosis not present

## 2021-01-23 DIAGNOSIS — Z87891 Personal history of nicotine dependence: Secondary | ICD-10-CM | POA: Insufficient documentation

## 2021-01-23 DIAGNOSIS — I2699 Other pulmonary embolism without acute cor pulmonale: Secondary | ICD-10-CM | POA: Insufficient documentation

## 2021-01-23 DIAGNOSIS — R918 Other nonspecific abnormal finding of lung field: Secondary | ICD-10-CM

## 2021-01-23 DIAGNOSIS — R590 Localized enlarged lymph nodes: Secondary | ICD-10-CM | POA: Insufficient documentation

## 2021-01-23 HISTORY — DX: Malignant neoplasm of unspecified part of unspecified bronchus or lung: C34.90

## 2021-01-23 LAB — SURGICAL PATHOLOGY

## 2021-01-23 NOTE — Telephone Encounter (Signed)
I received a message from Christy Kelley to call with an appt. I called and spoke to him. I updated on appt time and place. He verbalized understanding of appt.

## 2021-01-23 NOTE — Telephone Encounter (Signed)
SPOKE WITH PATIENT AND SHE IS AWARE OF MRI FOR 01-27-21- ARRIVAL TIME- 10:30 AM @ [facility], NO RESTRICTIONS TO TEST, PATIENT VERIFIED KNOWING OF THIS SCAN

## 2021-01-23 NOTE — Progress Notes (Signed)
Radiation Oncology         [phone] ________________________________  Name: Christy Kelley        MRN: [id number]  Date of Service: 01/23/2021 DOB: Jul 31, 1950  YK:DXIPJ, Alyson Locket, NP  Julian Hy, DO     REFERRING PHYSICIAN: Julian Hy, DO   DIAGNOSIS: The encounter diagnosis was Malignant neoplasm of right lung, unspecified part of lung ([place]).   HISTORY OF PRESENT ILLNESS: Christy Kelley is a 70 y.o. female seen at the request of Dr. Carlis Abbott for a right lung mass found by CT after she presented with shortness of breath and no dry cough with wheezing.  A CTPA on 01/16/2021 showed a right paratracheal lymph node measuring 16 mm, precarinal lymph node measuring 15 mm, and a bulky right hilar mass measuring up to 4.7 cm occluding the right main bronchus and proximal right lower lobe and right middle lobe bronchi with fluid and debris in the right middle and lower lobe bronchi.  Hyperlucent right upper middle and lower lobes largely cleared right upper lobe pneumonia and peripheral bandlike density in the right upper lobe likely from scarring was also seen. A CT of the head showed no evidence of intracranial abnormality on 01/17/2021.  She was seen by Dr. Carlis Abbott who recommended bronchoscopy with EBUS. This was performed on 01/18/21 showed atypical cells in the right lung washings, and the FNA of the right hilar mass showed small cell carcinoma as did her 4R lymph nodes.  She is scheduled for PET on 01/31/2021 and for an MRI of the brain on 01/29/2021.  She is seen today to discuss treatment recommendations of her cancer.    PREVIOUS RADIATION THERAPY: No   PAST MEDICAL HISTORY:  Past Medical History:  Diagnosis Date   Lung cancer [facility])        PAST SURGICAL HISTORY: Past Surgical History:  Procedure Laterality Date   BRONCHIAL WASHINGS  01/18/2021   Procedure: BRONCHIAL WASHINGS;  Surgeon: Julian Hy, DO;  Location: [facility];  Service: Endoscopy;;   ENDOBRONCHIAL ULTRASOUND  N/A 01/18/2021   Procedure: ENDOBRONCHIAL ULTRASOUND;  Surgeon: Julian Hy, DO;  Location: [facility];  Service: Endoscopy;  Laterality: N/A;   FINE NEEDLE ASPIRATION  01/18/2021   Procedure: FINE NEEDLE ASPIRATION (FNA) LINEAR;  Surgeon: Julian Hy, DO;  Location: [facility];  Service: Endoscopy;;   VIDEO BRONCHOSCOPY N/A 01/18/2021   Procedure: VIDEO BRONCHOSCOPY WITHOUT FLUORO;  Surgeon: Julian Hy, DO;  Location: [facility];  Service: Endoscopy;  Laterality: N/A;     FAMILY HISTORY:  Family History  Problem Relation Age of Onset   Arthritis/Rheumatoid Mother    Diabetes Father    Hodgkin's lymphoma Father      SOCIAL HISTORY:  reports that she quit smoking about 4 months ago. Her smoking use included cigarettes. She has a 8.75 pack-year smoking history. She has never used smokeless tobacco. She reports current alcohol use. She reports that she does not use drugs.  The patient is married and lives in [place].    ALLERGIES: Patient has no known allergies.   MEDICATIONS:  Current Outpatient Medications  Medication Sig Dispense Refill   albuterol (VENTOLIN HFA) 108 (90 Base) MCG/ACT inhaler Inhale 2 puffs into the lungs every 6 (six) hours as needed for wheezing or shortness of breath. 1 each 1   Ascorbic Acid (VITAMIN C PO) Take 1,000 mg by mouth daily.     meloxicam (MOBIC) 15 MG tablet Take 1 tablet (15 mg total)  by mouth daily. (Patient taking differently: Take 15 mg by mouth every other day.) 30 tablet 2   Multiple Vitamin (MULTIVITAMIN) tablet Take 1 tablet by mouth daily.     Vitamin D, Cholecalciferol, 25 MCG (1000 UT) TABS Take 1,000 Units by mouth daily.     No current facility-administered medications for this encounter.     REVIEW OF SYSTEMS: On review of systems, the patient reports that she is doing fair with her breathing. She has not used oxygen at home but has had shortness of breath using accessory muscles and that she has had a hard time  with comfortably resting. She reports she has more shortness of breath though with exertion, but her husband feels she tires easily. She has had a non productive cough without hemoptysis. She has lost just a few pounds unintentionally. She does have mental fogginess that was felt to be long covid in nature and from her infection back in April 2022. No headaches or visual changes are noted. No other complaints are verbalized.      PHYSICAL EXAM:  Wt Readings from Last 3 Encounters:  01/23/21 122 lb 9.6 oz (55.6 kg)  01/18/21 121 lb (54.9 kg)  11/23/20 125 lb 8 oz (56.9 kg)   Temp Readings from Last 3 Encounters:  01/23/21 (!) 97.3 F (36.3 C)  01/19/21 98.6 F (37 C) (Oral)  11/23/20 98 F (36.7 C)   BP Readings from Last 3 Encounters:  01/23/21 (!) 126/103  01/19/21 124/83  11/23/20 112/76   Pulse Readings from Last 3 Encounters:  01/23/21 (!) 108  01/19/21 81  11/23/20 86   Pain Assessment Pain Score: 1  Pain Loc: Abdomen (Right Side)/10  In general this is a tired appearing caucasian female in no acute distress. She's alert and oriented x4 and appropriate throughout the examination. Cardiopulmonary assessment is negative for acute distress and she exhibits normal effort.     ECOG = 1  0 - Asymptomatic (Fully active, able to carry on all predisease activities without restriction)  1 - Symptomatic but completely ambulatory (Restricted in physically strenuous activity but ambulatory and able to carry out work of a light or sedentary nature. For example, light housework, office work)  2 - Symptomatic, <50% in bed during the day (Ambulatory and capable of all self care but unable to carry out any work activities. Up and about more than 50% of waking hours)  3 - Symptomatic, >50% in bed, but not bedbound (Capable of only limited self-care, confined to bed or chair 50% or more of waking hours)  4 - Bedbound (Completely disabled. Cannot carry on any self-care. Totally confined  to bed or chair)  5 - Death   Eustace Pen MM, Creech RH, Tormey DC, et al. [phone]). "Toxicity and response criteria of the [facility]". Magnet Oncol. 5 (6): 649-55    LABORATORY DATA:  Lab Results  Component Value Date   WBC 7.0 01/16/2021   HGB 13.0 01/16/2021   HCT 37.6 01/16/2021   MCV 93.1 01/16/2021   PLT 393 01/16/2021   Lab Results  Component Value Date   NA 129 (L) 01/18/2021   K 3.7 01/18/2021   CL 94 (L) 01/18/2021   CO2 26 01/18/2021   Lab Results  Component Value Date   ALT 11 11/24/2020   AST 23 11/24/2020   ALKPHOS 72 11/24/2020   BILITOT 0.4 11/24/2020      RADIOGRAPHY: DG Chest 2 View  Result Date: 01/16/2021 CLINICAL  DATA:  Abnormal chest x-ray. EXAM: CHEST - 2 VIEW COMPARISON:  11/23/2020 FINDINGS: Persistent linear densities in the right perihilar region extending to the periphery of the right lung. These linear densities have decreased since 11/23/2020. However, there may be slightly increased fullness in the right hilum. Remainder of the lungs are clear. Again noted is tenting of the right hemidiaphragm. Heart and mediastinum are within normal limits. No pleural effusions. No acute bone abnormality. IMPRESSION: 1. Linear densities in the right chest have decreased and suggestive for resolving infectious or inflammatory process. However, there may be slightly increased fullness in the right hilum which could be related to the projection of the frontal image since minimal change on the lateral view. Recommend continued follow-up to evaluate this area. 2. No acute chest abnormality. Electronically Signed   By: Markus Daft M.D.   On: 01/16/2021 15:33   CT HEAD WO CONTRAST (5MM)  Result Date: 01/17/2021 CLINICAL DATA:  Pulmonary mass, altered mental status EXAM: CT HEAD WITHOUT CONTRAST TECHNIQUE: Contiguous axial images were obtained from the base of the skull through the vertex without intravenous contrast. COMPARISON:  07/27/2020  FINDINGS: Brain: Normal anatomic configuration. Parenchymal volume loss is commensurate with the patient's age. Mild periventricular white matter changes are present likely reflecting the sequela of small vessel ischemia. No abnormal intra or extra-axial mass lesion or fluid collection. No abnormal mass effect or midline shift. No evidence of acute intracranial hemorrhage or infarct. Ventricular size is normal. Cerebellum unremarkable. Vascular: No asymmetric hyperdense vasculature at the skull base. Skull: Intact Sinuses/Orbits: Paranasal sinuses are clear. Orbits are unremarkable. Other: Mastoid air cells and middle ear cavities are clear. IMPRESSION: No acute intracranial abnormality. No evidence of intracranial metastatic disease. Note that MRI examination with contrast has better sensitivity for detection of small intracranial metastases. Mild senescent change. Electronically Signed   By: Fidela Salisbury M.D.   On: 01/17/2021 03:08   CT Angio Chest Pulmonary Embolism (PE) W or WO Contrast  Result Date: 01/16/2021 CLINICAL DATA:  Shortness of breath and wheezing dry cough EXAM: CT ANGIOGRAPHY CHEST WITH CONTRAST TECHNIQUE: Multidetector CT imaging of the chest was performed using the standard protocol during bolus administration of intravenous contrast. Multiplanar CT image reconstructions and MIPs were obtained to evaluate the vascular anatomy. CONTRAST:  51mL OMNIPAQUE IOHEXOL 350 MG/ML SOLN COMPARISON:  Chest x-ray 01/16/2021, CT chest 07/27/2020 FINDINGS: Cardiovascular: Satisfactory opacification of the pulmonary arteries to the segmental level. No evidence of pulmonary embolism. Narrowing of right upper lobe and distal right pulmonary artery by lobulated soft tissue mass. Mild aortic atherosclerosis. No aneurysm. Normal cardiac size. No pericardial effusion. Coronary calcifications and mitral calcifications. Mediastinum/Nodes: Midline trachea. No thyroid mass. Right paratracheal node measures 16 mm.  Precarinal lymph node measures 15 mm. Bulky right hilar mass measuring 4.7 by 4.2 by 3.7 cm approximately. Occlusion of the right main bronchus and proximal right lower lobe and middle lobe bronchi 6 with fluid or debris in the right middle and lower lobe bronchi. Lungs/Pleura: Hyperlucent right upper, middle and lower lobes. Largely cleared right upper lobe pneumonia. Peripheral bandlike density in the right upper lobe most likely scarring. Upper Abdomen: Subcentimeter hypodensity in the left hepatic lobe. No acute abnormality. Musculoskeletal: No acute or suspicious osseous abnormality. Review of the MIP images confirms the above findings. IMPRESSION: 1. Negative for acute pulmonary embolism. 2. Interval finding of bulky right hilar mass/neoplasm with narrowing of the distal right pulmonary artery and right upper lobe pulmonary vessels. Occlusion of distal right  main bronchus and right lower and middle lobe bronchi by mass lesion. Fluid and or debris within distal right lower and middle lobe bronchi. There are multiple enlarged mediastinal lymph nodes. 3. Largely cleared right upper lobe pneumonia with peripheral bandlike density in the right upper lobe likely scarring. Aortic Atherosclerosis (ICD10-I70.0). Electronically Signed   By: Donavan Foil M.D.   On: 01/16/2021 23:22   DG CHEST PORT 1 VIEW  Result Date: 01/18/2021 CLINICAL DATA:  Bronchoscopy EXAM: PORTABLE CHEST 1 VIEW COMPARISON:  01/16/2021, CT 01/16/2021 FINDINGS: Linear atelectasis in the right mid lung. Right hilar mass redemonstrated. No pneumothorax. Stable cardiomediastinal silhouette. No focal opacity. Possible tiny right pleural effusion IMPRESSION: 1. Right hilar mass redemonstrated. There is trace right effusion but no visible pneumothorax Electronically Signed   By: Donavan Foil M.D.   On: 01/18/2021 16:27       IMPRESSION/PLAN: 1. 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 Dr. Lisbeth Renshaw discusses the pathology findings and reviews the  nature of small cell carcinoma of the lung. She will also see Dr. Julien Nordmann to discuss systemic therapy. We will proceed with her PET scan and MRI which have been moved up. She may be a good candidate for chemoRT. We discussed the risks, benefits, short, and long term effects of radiotherapy, as well as the differing  intents to treatment and the patient is interested in proceeding based on her PET scan. Dr. Lisbeth Renshaw discusses the delivery and logistics of radiotherapy and anticipates a course of  up to 6 1/2 weeks of radiotherapy if limited stage. Written consent is obtained and placed in the chart, a copy was provided to the patient. She will be contacted by simulation staff to coordinate simulation. We would anticipate starting treatment on 02/05/21 if concurrent chemoRT based on PET. 2. PCI. We briefly discussed the rationale for prophylactic cranial irradiation and the need for staging MRI even though her CT brain was negative during her hospitalization. She is in agreement and we will revisit this treatment moving forward.   In a visit lasting 60 minutes, greater than 50% of the time was spent face to face discussing the patient's condition, in preparation for the discussion, and coordinating the patient's care.    The above documentation reflects my direct findings during this shared patient visit. Please see the separate note by Dr. Lisbeth Renshaw on this date for the remainder of the patient's plan of care.    Carola Rhine, [facility]   **Disclaimer: This note was dictated with voice recognition software. Similar sounding words can inadvertently be transcribed and this note may contain transcription errors which may not have been corrected upon publication of note.**

## 2021-01-24 ENCOUNTER — Telehealth: Payer: Self-pay | Admitting: *Deleted

## 2021-01-24 ENCOUNTER — Inpatient Hospital Stay: Payer: Medicare HMO | Attending: Internal Medicine | Admitting: Internal Medicine

## 2021-01-24 ENCOUNTER — Inpatient Hospital Stay: Payer: Medicare HMO

## 2021-01-24 ENCOUNTER — Encounter: Payer: Self-pay | Admitting: Internal Medicine

## 2021-01-24 ENCOUNTER — Encounter: Payer: Self-pay | Admitting: *Deleted

## 2021-01-24 DIAGNOSIS — R918 Other nonspecific abnormal finding of lung field: Secondary | ICD-10-CM

## 2021-01-24 DIAGNOSIS — C7931 Secondary malignant neoplasm of brain: Secondary | ICD-10-CM | POA: Insufficient documentation

## 2021-01-24 DIAGNOSIS — Z5111 Encounter for antineoplastic chemotherapy: Secondary | ICD-10-CM | POA: Insufficient documentation

## 2021-01-24 DIAGNOSIS — Z79899 Other long term (current) drug therapy: Secondary | ICD-10-CM | POA: Diagnosis not present

## 2021-01-24 DIAGNOSIS — C3491 Malignant neoplasm of unspecified part of right bronchus or lung: Secondary | ICD-10-CM | POA: Diagnosis not present

## 2021-01-24 DIAGNOSIS — C3490 Malignant neoplasm of unspecified part of unspecified bronchus or lung: Secondary | ICD-10-CM | POA: Diagnosis not present

## 2021-01-24 LAB — CBC WITH DIFFERENTIAL (CANCER CENTER ONLY)
Abs Immature Granulocytes: 0.03 10*3/uL (ref 0.00–0.07)
Basophils Absolute: 0 10*3/uL (ref 0.0–0.1)
Basophils Relative: 1 %
Eosinophils Absolute: 0.1 10*3/uL (ref 0.0–0.5)
Eosinophils Relative: 2 %
HCT: 36.7 % (ref 36.0–46.0)
Hemoglobin: 12.8 g/dL (ref 12.0–15.0)
Immature Granulocytes: 0 %
Lymphocytes Relative: 22 %
Lymphs Abs: 1.5 10*3/uL (ref 0.7–4.0)
MCH: 32.3 pg (ref 26.0–34.0)
MCHC: 34.9 g/dL (ref 30.0–36.0)
MCV: 92.7 fL (ref 80.0–100.0)
Monocytes Absolute: 0.7 10*3/uL (ref 0.1–1.0)
Monocytes Relative: 9 %
Neutro Abs: 4.7 10*3/uL (ref 1.7–7.7)
Neutrophils Relative %: 66 %
Platelet Count: 435 10*3/uL — ABNORMAL HIGH (ref 150–400)
RBC: 3.96 MIL/uL (ref 3.87–5.11)
RDW: 12.7 % (ref 11.5–15.5)
WBC Count: 7.1 10*3/uL (ref 4.0–10.5)
nRBC: 0 % (ref 0.0–0.2)

## 2021-01-24 LAB — CMP (CANCER CENTER ONLY)
ALT: 12 U/L (ref 0–44)
AST: 22 U/L (ref 15–41)
Albumin: 3.6 g/dL (ref 3.5–5.0)
Alkaline Phosphatase: 95 U/L (ref 38–126)
Anion gap: 10 (ref 5–15)
BUN: 19 mg/dL (ref 8–23)
CO2: 27 mmol/L (ref 22–32)
Calcium: 9.7 mg/dL (ref 8.9–10.3)
Chloride: 97 mmol/L — ABNORMAL LOW (ref 98–111)
Creatinine: 0.71 mg/dL (ref 0.44–1.00)
GFR, Estimated: >60 mL/min (ref >60)
Glucose, Bld: 98 mg/dL (ref 70–99)
Potassium: 3.4 mmol/L — ABNORMAL LOW (ref 3.5–5.1)
Sodium: 134 mmol/L — ABNORMAL LOW (ref 135–145)
Total Bilirubin: 0.4 mg/dL (ref 0.3–1.2)
Total Protein: 6.2 g/dL — ABNORMAL LOW (ref 6.5–8.1)

## 2021-01-24 MED ORDER — PROCHLORPERAZINE MALEATE 10 MG PO TABS: 10.0000 mg | ORAL_TABLET | Freq: Four times a day (QID) | ORAL | 0 refills | Status: DC | PRN

## 2021-01-24 NOTE — Progress Notes (Signed)
START ON PATHWAY REGIMEN - Small Cell Lung     A cycle is every 21 days:     Etoposide      Cisplatin   **Always confirm dose/schedule in your pharmacy ordering system**  Patient Characteristics: Newly Diagnosed, Preoperative or Nonsurgical Candidate (Clinical Staging), First Line, Limited Stage, Nonsurgical Candidate Therapeutic Status: Newly Diagnosed, Preoperative or Nonsurgical Candidate (Clinical Staging) AJCC T Category: cT2b AJCC N Category: cN2 AJCC M Category: cM0 AJCC 8 Stage Grouping: IIIA Stage Classification: Limited Surgical Candidacy: Nonsurgical Candidate Intent of Therapy: Curative Intent, Discussed with Patient

## 2021-01-24 NOTE — Progress Notes (Signed)
[place] [phone]   [phone]  CONSULT NOTE  REFERRING PHYSICIAN: Dr. Noemi Chapel  REASON FOR CONSULTATION:  70 years old white female recently diagnosed with lung cancer  HPI Christy Kelley is a 70 y.o. female with no significant past medical history except for COVID-19 infection in April 2022 when she presented with cough and fever.  She had CT scan of the chest at that time on 07/27/2020 and that showed right upper lobe infiltrate with a small right pleural effusion and mild right hilar and pretracheal adenopathy.  The patient continues to have persistent cough since that time and she started having more wheezing as well as shortness of breath.  She presented to the emergency department on 01/16/2021 and CT angiogram of the chest was performed at that time and that showed right paratracheal node measuring 1.6 cm.  There was a precarinal lymph node measuring 1.5 cm and bulky right hilar mass measuring 4.7 x 4.2 x 3.7 cm.  There was occlusion of the right mainstem bronchus and proximal right lower lobe and right middle lobe bronchi with fluid or debris's in the right middle and lower lobe bronchi.  There was clearance of the right upper lobe pneumonia that was seen on the previous scan.  The patient was seen by Dr. Carlis Abbott and on 01/16/2021 she underwent video bronchoscopy with EBUS.  The final pathology ([id number]) showed a small cell carcinoma. Immunohistochemical stains for synaptophysin and CD56 are positive, consistent with above interpretation. Dr. Carlis Abbott kindly referred the patient to me as well as radiation oncology for evaluation and treatment of her condition. When seen today she continues to have pain on the right side of the chest as well as shortness of breath and dry cough with no hemoptysis.  She denied having any nausea, vomiting, diarrhea or constipation.  She denied having any fever or chills.  She has no weight loss or night sweats.  She  has no headache or visual changes. Family history significant for mother with rheumatoid arthritis.  Father had Hodgkin lymphoma and diabetes mellitus. The patient is married and has 2 children a son 6 and daughter 64.  She used to work at [employer].  The patient has a history of smoking less than 1 pack/day for around 50 years and quit few days ago.  She also drinks 2 beers every day.  She has no history of drug abuse.  HPI  Past Medical History:  Diagnosis Date   Lung cancer [facility])     Past Surgical History:  Procedure Laterality Date   BRONCHIAL WASHINGS  01/18/2021   Procedure: BRONCHIAL WASHINGS;  Surgeon: Julian Hy, DO;  Location: [facility];  Service: Endoscopy;;   ENDOBRONCHIAL ULTRASOUND N/A 01/18/2021   Procedure: ENDOBRONCHIAL ULTRASOUND;  Surgeon: Julian Hy, DO;  Location: [facility];  Service: Endoscopy;  Laterality: N/A;   FINE NEEDLE ASPIRATION  01/18/2021   Procedure: FINE NEEDLE ASPIRATION (FNA) LINEAR;  Surgeon: Julian Hy, DO;  Location: [facility];  Service: Endoscopy;;   VIDEO BRONCHOSCOPY N/A 01/18/2021   Procedure: VIDEO BRONCHOSCOPY WITHOUT FLUORO;  Surgeon: Julian Hy, DO;  Location: [facility];  Service: Endoscopy;  Laterality: N/A;    Family History  Problem Relation Age of Onset   Arthritis/Rheumatoid Mother    Diabetes Father    Hodgkin's lymphoma Father     Social History Social History   Tobacco Use   Smoking status: Former    Packs/day: 0.25  Years: 35.00    Pack years: 8.75    Types: Cigarettes    Quit date: 08/29/2020    Years since quitting: 0.4   Smokeless tobacco: Never  Vaping Use   Vaping Use: Never used  Substance Use Topics   Alcohol use: Yes    Comment: Beer 6 daily. Recenlty went to 2-3 dialy   Drug use: Never    No Known Allergies  Current Outpatient Medications  Medication Sig Dispense Refill   albuterol (VENTOLIN HFA) 108 (90 Base) MCG/ACT inhaler Inhale 2 puffs into the lungs every 6  (six) hours as needed for wheezing or shortness of breath. 1 each 1   Ascorbic Acid (VITAMIN C PO) Take 1,000 mg by mouth daily.     meloxicam (MOBIC) 15 MG tablet Take 1 tablet (15 mg total) by mouth daily. (Patient taking differently: Take 15 mg by mouth every other day.) 30 tablet 2   Multiple Vitamin (MULTIVITAMIN) tablet Take 1 tablet by mouth daily.     Vitamin D, Cholecalciferol, 25 MCG (1000 UT) TABS Take 1,000 Units by mouth daily.     No current facility-administered medications for this visit.    Review of Systems  Constitutional: positive for fatigue Eyes: negative Ears, nose, mouth, throat, and face: negative Respiratory: positive for cough, dyspnea on exertion, and pleurisy/chest pain Cardiovascular: negative Gastrointestinal: negative Genitourinary:negative Integument/breast: negative Hematologic/lymphatic: negative Musculoskeletal:negative Neurological: negative Behavioral/Psych: negative Endocrine: negative Allergic/Immunologic: negative  Physical Exam  IWL:NLGXQ, healthy, no distress, well nourished, well developed, and anxious SKIN: skin color, texture, turgor are normal, no rashes or significant lesions HEAD: Normocephalic, No masses, lesions, tenderness or abnormalities EYES: normal, PERRLA, Conjunctiva are pink and non-injected EARS: External ears normal, Canals clear OROPHARYNX:no exudate, no erythema, and lips, buccal mucosa, and tongue normal  NECK: supple, no adenopathy, no JVD LYMPH:  no palpable lymphadenopathy, no hepatosplenomegaly BREAST:not examined LUNGS: prolonged expiratory phase, expiratory wheezes bilaterally HEART: regular rate & rhythm, no murmurs, and no gallops ABDOMEN:abdomen soft, non-tender, normal bowel sounds, and no masses or organomegaly BACK: No CVA tenderness, Range of motion is normal EXTREMITIES:no joint deformities, effusion, or inflammation, no edema  NEURO: alert & oriented x 3 with fluent speech, no focal motor/sensory  deficits  PERFORMANCE STATUS: ECOG 1  LABORATORY DATA: Lab Results  Component Value Date   WBC 7.1 01/24/2021   HGB 12.8 01/24/2021   HCT 36.7 01/24/2021   MCV 92.7 01/24/2021   PLT 435 (H) 01/24/2021      Chemistry      Component Value Date/Time   NA 134 (L) 01/24/2021 1121   K 3.4 (L) 01/24/2021 1121   CL 97 (L) 01/24/2021 1121   CO2 27 01/24/2021 1121   BUN 19 01/24/2021 1121   CREATININE 0.71 01/24/2021 1121      Component Value Date/Time   CALCIUM 9.7 01/24/2021 1121   ALKPHOS 95 01/24/2021 1121   AST 22 01/24/2021 1121   ALT 12 01/24/2021 1121   BILITOT 0.4 01/24/2021 1121       RADIOGRAPHIC STUDIES: DG Chest 2 View  Result Date: 01/16/2021 CLINICAL DATA:  Abnormal chest x-ray. EXAM: CHEST - 2 VIEW COMPARISON:  11/23/2020 FINDINGS: Persistent linear densities in the right perihilar region extending to the periphery of the right lung. These linear densities have decreased since 11/23/2020. However, there may be slightly increased fullness in the right hilum. Remainder of the lungs are clear. Again noted is tenting of the right hemidiaphragm. Heart and mediastinum are within normal limits. No  pleural effusions. No acute bone abnormality. IMPRESSION: 1. Linear densities in the right chest have decreased and suggestive for resolving infectious or inflammatory process. However, there may be slightly increased fullness in the right hilum which could be related to the projection of the frontal image since minimal change on the lateral view. Recommend continued follow-up to evaluate this area. 2. No acute chest abnormality. Electronically Signed   By: Markus Daft M.D.   On: 01/16/2021 15:33   CT HEAD WO CONTRAST (5MM)  Result Date: 01/17/2021 CLINICAL DATA:  Pulmonary mass, altered mental status EXAM: CT HEAD WITHOUT CONTRAST TECHNIQUE: Contiguous axial images were obtained from the base of the skull through the vertex without intravenous contrast. COMPARISON:  07/27/2020  FINDINGS: Brain: Normal anatomic configuration. Parenchymal volume loss is commensurate with the patient's age. Mild periventricular white matter changes are present likely reflecting the sequela of small vessel ischemia. No abnormal intra or extra-axial mass lesion or fluid collection. No abnormal mass effect or midline shift. No evidence of acute intracranial hemorrhage or infarct. Ventricular size is normal. Cerebellum unremarkable. Vascular: No asymmetric hyperdense vasculature at the skull base. Skull: Intact Sinuses/Orbits: Paranasal sinuses are clear. Orbits are unremarkable. Other: Mastoid air cells and middle ear cavities are clear. IMPRESSION: No acute intracranial abnormality. No evidence of intracranial metastatic disease. Note that MRI examination with contrast has better sensitivity for detection of small intracranial metastases. Mild senescent change. Electronically Signed   By: Fidela Salisbury M.D.   On: 01/17/2021 03:08   CT Angio Chest Pulmonary Embolism (PE) W or WO Contrast  Result Date: 01/16/2021 CLINICAL DATA:  Shortness of breath and wheezing dry cough EXAM: CT ANGIOGRAPHY CHEST WITH CONTRAST TECHNIQUE: Multidetector CT imaging of the chest was performed using the standard protocol during bolus administration of intravenous contrast. Multiplanar CT image reconstructions and MIPs were obtained to evaluate the vascular anatomy. CONTRAST:  63mL OMNIPAQUE IOHEXOL 350 MG/ML SOLN COMPARISON:  Chest x-ray 01/16/2021, CT chest 07/27/2020 FINDINGS: Cardiovascular: Satisfactory opacification of the pulmonary arteries to the segmental level. No evidence of pulmonary embolism. Narrowing of right upper lobe and distal right pulmonary artery by lobulated soft tissue mass. Mild aortic atherosclerosis. No aneurysm. Normal cardiac size. No pericardial effusion. Coronary calcifications and mitral calcifications. Mediastinum/Nodes: Midline trachea. No thyroid mass. Right paratracheal node measures 16 mm.  Precarinal lymph node measures 15 mm. Bulky right hilar mass measuring 4.7 by 4.2 by 3.7 cm approximately. Occlusion of the right main bronchus and proximal right lower lobe and middle lobe bronchi 6 with fluid or debris in the right middle and lower lobe bronchi. Lungs/Pleura: Hyperlucent right upper, middle and lower lobes. Largely cleared right upper lobe pneumonia. Peripheral bandlike density in the right upper lobe most likely scarring. Upper Abdomen: Subcentimeter hypodensity in the left hepatic lobe. No acute abnormality. Musculoskeletal: No acute or suspicious osseous abnormality. Review of the MIP images confirms the above findings. IMPRESSION: 1. Negative for acute pulmonary embolism. 2. Interval finding of bulky right hilar mass/neoplasm with narrowing of the distal right pulmonary artery and right upper lobe pulmonary vessels. Occlusion of distal right main bronchus and right lower and middle lobe bronchi by mass lesion. Fluid and or debris within distal right lower and middle lobe bronchi. There are multiple enlarged mediastinal lymph nodes. 3. Largely cleared right upper lobe pneumonia with peripheral bandlike density in the right upper lobe likely scarring. Aortic Atherosclerosis (ICD10-I70.0). Electronically Signed   By: Donavan Foil M.D.   On: 01/16/2021 23:22   DG CHEST  PORT 1 VIEW  Result Date: 01/18/2021 CLINICAL DATA:  Bronchoscopy EXAM: PORTABLE CHEST 1 VIEW COMPARISON:  01/16/2021, CT 01/16/2021 FINDINGS: Linear atelectasis in the right mid lung. Right hilar mass redemonstrated. No pneumothorax. Stable cardiomediastinal silhouette. No focal opacity. Possible tiny right pleural effusion IMPRESSION: 1. Right hilar mass redemonstrated. There is trace right effusion but no visible pneumothorax Electronically Signed   By: Donavan Foil M.D.   On: 01/18/2021 16:27    ASSESSMENT: This is a very pleasant 70 years old white female recently diagnosed with limited stage (T2 a, N2, MX) small cell  lung cancer pending further staging work-up diagnosed in September 2022 and presented with bulky right hilar mass with mediastinal lymphadenopathy and occlusion of the distal right main bronchus and right lower and middle lobe bronchi by the mass lesion.  PLAN: I had a lengthy discussion with the patient and her husband today about her current disease stage, prognosis and treatment options. I personally and independently reviewed the scan images and discussed the result and showed some of the images to the patient and her husband. I recommended for the patient to complete the staging work-up by having a PET scan as well as MRI of the brain to rule out any metastatic disease. If the patient has no evidence of metastatic disease outside the currently seen lesions, I will consider her for a course of systemic chemotherapy with cisplatin 80 Mg/M2 on day 1 and 2 etoposide 100 Mg/M2 on days 1, 2 and 3 every 3 weeks for a total of 4 cycles.  This will be concurrent with radiotherapy for around 6 weeks during the treatment. If the scan showed any evidence for metastatic disease, I may consider changing her systemic chemotherapy to carboplatin for AUC of 5 on day 1 and etoposide 100 Mg/M2 on days 1, 2 and 3 with Imfinzi 1500 Mg IV every 3 weeks with Cosela for Myeloprotection. I discussed with the patient the adverse effect of this treatment including but not limited to alopecia, myelosuppression, nausea and vomiting, peripheral neuropathy, liver or renal dysfunction. She will have a chemotherapy education class before the first dose of her treatment. The patient is expected to start the first dose of her treatment next week and we may modify her chemotherapy according to the final staging work-up. I will call her pharmacy with prescription for Compazine 10 mg p.o. every 6 hours as needed for nausea. The patient will come back for follow-up visit in 2 weeks for evaluation and management of any adverse effect of  her chemotherapy. She was advised to call immediately if she has any other concerning symptoms in the interval.  The patient voices understanding of current disease status and treatment options and is in agreement with the current care plan.  All questions were answered. The patient knows to call the clinic with any problems, questions or concerns. We can certainly see the patient much sooner if necessary.  Thank you so much for allowing me to participate in the care of Christy Kelley. I will continue to follow up the patient with you and assist in her care.  The total time spent in the appointment was 90 minutes.  Disclaimer: This note was dictated with voice recognition software. Similar sounding words can inadvertently be transcribed and may not be corrected upon review.   Eilleen Kempf January 24, 2021, 12:12 PM

## 2021-01-24 NOTE — Progress Notes (Signed)
[facility] Psychosocial Distress Screening Clinical Social Work   Clinical Social Work was referred by distress screening protocol.  The patient scored a 5 on the Psychosocial Distress Thermometer which indicates moderate distress. Social Work Theatre manager contacted patient by phone to assess for distress and other psychosocial needs.    ONCBCN DISTRESS SCREENING 01/23/2021  Screening Type Initial Screening  Distress experienced in past week (1-10) 5  Emotional problem type Adjusting to illness  Physical Problem type Sleep/insomnia;Loss of appetitie;Talking  Other Contact via [phone]   Patient reports that since her appointment with rad onc yesterday her distress has gone down considerably and she has no further needs at this time. Intern provided pt with information about support services and how to contact CSW, and encouraged her to reach out if any needs arise in the future.  Rosary Lively, BSW Intern Supervised by:  Gwinda Maine, LCSW

## 2021-01-24 NOTE — Telephone Encounter (Signed)
Spoke with the patient's husband to inform him that her PET scan has been rescheduled to 01/29/2021, arrival time 0800 and scan at 0830.  He verbalized understanding.  Gloriajean Dell. Leonie Green, BSN

## 2021-01-25 ENCOUNTER — Telehealth: Payer: Self-pay | Admitting: Internal Medicine

## 2021-01-25 ENCOUNTER — Telehealth: Payer: Self-pay | Admitting: *Deleted

## 2021-01-25 ENCOUNTER — Encounter (HOSPITAL_COMMUNITY): Payer: Medicare HMO

## 2021-01-25 ENCOUNTER — Encounter: Payer: Self-pay | Admitting: General Practice

## 2021-01-25 NOTE — Telephone Encounter (Signed)
Scheduled follow-up appointments per 10/5 los. Patient is aware.

## 2021-01-25 NOTE — Progress Notes (Signed)
Synopsis: Referred for limited stage SCLC by Michela Pitcher, NP  Subjective:   PATIENT ID: Christy Kelley GENDER: female DOB: 1950-09-01, MRN: [id number]  Chief Complaint  Patient presents with   [facility] f/u    70yF with recently discovered limited stage SCLC, history of smoking.   She was hospitalized 9/28-9/30 where she was seen by pulmonary consult team for RLL mass and mediastinal adenopathy. Underwent EBUS revealing SCLC with involvement at 4R, RLL mass. DUring procedure, noted 90% occlusive mass in R mainstem that was friable and unable to be traversed with scope. Seen in follow up with Oncology 10/5 with plan to complete staging with PET/CT and MR brain, chemotherapy.   She does have cough. No hemoptysis. Not productive. She does have some dyspnea with exertion - it isn't limiting her activity at present since they're trying to rest as much as possible during anticipated chemotherapy. No chest pain. No lightheadedness, fever, bleeding.   Otherwise pertinent review of systems is negative.  Smoked 20 years half ppd. Quit smoking last week. Has never had PFTs before.   She has no family history of lung disease.   She worked as a Sports administrator. No exposure to dusts/solvents/particulates without a mask at home or work. Has a dog at home.     Past Medical History:  Diagnosis Date   Lung cancer [facility])      Family History  Problem Relation Age of Onset   Arthritis/Rheumatoid Mother    Diabetes Father    Hodgkin's lymphoma Father      Past Surgical History:  Procedure Laterality Date   BRONCHIAL WASHINGS  01/18/2021   Procedure: BRONCHIAL WASHINGS;  Surgeon: Julian Hy, DO;  [facility];  Service: Endoscopy;;   ENDOBRONCHIAL ULTRASOUND N/A 01/18/2021   Procedure: ENDOBRONCHIAL ULTRASOUND;  Surgeon: Julian Hy, DO;  [facility];  Service: Endoscopy;  Laterality: N/A;   FINE NEEDLE ASPIRATION  01/18/2021    Procedure: FINE NEEDLE ASPIRATION (FNA) LINEAR;  Surgeon: Julian Hy, DO;  [facility];  Service: Endoscopy;;   VIDEO BRONCHOSCOPY N/A 01/18/2021   Procedure: VIDEO BRONCHOSCOPY WITHOUT FLUORO;  Surgeon: Julian Hy, DO;  [facility];  Service: Endoscopy;  Laterality: N/A;    Social History   Socioeconomic History   Marital status: Married    Spouse name: Not on file   Number of children: Not on file   Years of education: Not on file   Highest education level: Not on file  Occupational History   Not on file  Tobacco Use   Smoking status: Former    Packs/day: 0.25    Years: 35.00    Pack years: 8.75    Types: Cigarettes    Quit date: 08/29/2020    Years since quitting: 0.4   Smokeless tobacco: Never  Vaping Use   Vaping Use: Never used  Substance and Sexual Activity   Alcohol use: Yes    Comment: Beer 6 daily. Recenlty went to 2-3 dialy   Drug use: Never   Sexual activity: Not on file  Other Topics Concern   Not on file  Social History Narrative   Not on file   Social Determinants of Health   Financial Resource Strain: Not on file  Food Insecurity: Not on file  Transportation Needs: Not on file  Physical Activity: Not on file  Stress: Not on file  Social Connections: Not on file  Intimate Partner  Violence: Not on file     No Known Allergies   Outpatient Medications Prior to Visit  Medication Sig Dispense Refill   albuterol (VENTOLIN HFA) 108 (90 Base) MCG/ACT inhaler Inhale 2 puffs into the lungs every 6 (six) hours as needed for wheezing or shortness of breath. 1 each 1   Ascorbic Acid (VITAMIN C PO) Take 1,000 mg by mouth daily.     meloxicam (MOBIC) 15 MG tablet Take 1 tablet (15 mg total) by mouth daily. (Patient taking differently: Take 15 mg by mouth every other day.) 30 tablet 2   Multiple Vitamin (MULTIVITAMIN) tablet Take 1 tablet by mouth daily.     prochlorperazine (COMPAZINE) 10 MG tablet Take 1 tablet (10 mg total) by  mouth every 6 (six) hours as needed for nausea or vomiting. 30 tablet 0   Vitamin D, Cholecalciferol, 25 MCG (1000 UT) TABS Take 1,000 Units by mouth daily.     No facility-administered medications prior to visit.       Objective:   Physical Exam:  General appearance: 70 y.o., female, NAD, conversant  Eyes: anicteric sclerae, moist conjunctivae; no lid-lag; PERRL, tracking appropriately HENT: NCAT; oropharynx, MMM, no mucosal ulcerations; normal hard and soft palate Neck: Trachea midline; no lymphadenopathy, no JVD Lungs: Diminished on right but still hear air movement, no crackles, no wheeze, with normal respiratory effort CV: RRR, no MRGs  Abdomen: Soft, non-tender; non-distended, BS present  Extremities: No peripheral edema, radial and DP pulses present bilaterally  Skin: Normal temperature, turgor and texture; no rash Psych: Appropriate affect Neuro: Alert and oriented to person and place, no focal deficit    Vitals:   01/26/21 1002  BP: (!) 84/66  Pulse: (!) 104  Temp: 97.7 F (36.5 C)  TempSrc: Oral  SpO2: 94%  Weight: 121 lb 3.2 oz (55 kg)  Height: 5\' 3"  (1.6 m)   94% on RA BMI Readings from Last 3 Encounters:  01/26/21 21.47 kg/m  01/24/21 21.63 kg/m  01/23/21 21.72 kg/m   Wt Readings from Last 3 Encounters:  01/26/21 121 lb 3.2 oz (55 kg)  01/24/21 122 lb 1.6 oz (55.4 kg)  01/23/21 122 lb 9.6 oz (55.6 kg)     CBC    Component Value Date/Time   WBC 7.1 01/24/2021 1121   WBC 7.0 01/16/2021 1713   RBC 3.96 01/24/2021 1121   HGB 12.8 01/24/2021 1121   HCT 36.7 01/24/2021 1121   PLT 435 (H) 01/24/2021 1121   MCV 92.7 01/24/2021 1121   MCH 32.3 01/24/2021 1121   MCHC 34.9 01/24/2021 1121   RDW 12.7 01/24/2021 1121   LYMPHSABS 1.5 01/24/2021 1121   MONOABS 0.7 01/24/2021 1121   EOSABS 0.1 01/24/2021 1121   BASOSABS 0.0 01/24/2021 1121      Chest Imaging: CTA Chest 9/27 reviewed by me and remarkable for right hilar mass, occlusion of RMB,  mediastinal LAD  Pulmonary Functions Testing Results: No flowsheet data found.   Pathology:   EBNA 9/29: 4R and RLL mass with SCLC     Assessment & Plan:   # DOE: relatively mild and not interfering with activities at present. May be due to combination of COPD, RMB occlusion although I do hear air movement in lower right lung fields today.  # Smoking: stopped last week.  # SCLC, preliminary limited stage: awaiting PET/CT and MR brain to complete staging, plan for chemotherapy.  # Hypotension: may be due to poor PO intake. She insists she is not lightheaded. Recheck  is 96/68 manually.  Plan: - will discuss with Dr. Loletta Specter whether appears pt could benefit from [facility] tumor debulking with cryo/APC vs whether appeared predominantly extrinsically compressed. If significant instrinsic occlusion this could be considered to preserve ventilation to RLL/RML while starting chemo/XRT. - PFTs if feeling up to it, consider addition of controller inhaler for COPD if PFTs consistent with it  - prevnar 20 shot today - ED precautions given for lightheadedness/dizziness, fever      Maryjane Hurter, MD [facility] 01/26/2021 10:20 AM

## 2021-01-25 NOTE — Telephone Encounter (Signed)
Spoke with Jeneen Rinks to let him know that his wife's PET scan time has been changed to arrival at 7:30 am for 8:00 am scan.  Reminded of restrictions.  He verbalized understanding.  Gloriajean Dell. Quincy Simmonds [facility]

## 2021-01-26 ENCOUNTER — Encounter: Payer: Self-pay | Admitting: Student

## 2021-01-26 ENCOUNTER — Ambulatory Visit
Admission: RE | Admit: 2021-01-26 | Discharge: 2021-01-26 | Disposition: A | Discharge status: Home / Self Care | Payer: Medicare HMO | Source: Ambulatory Visit | Attending: Radiation Oncology | Admitting: Radiation Oncology

## 2021-01-26 ENCOUNTER — Other Ambulatory Visit: Payer: Medicare HMO

## 2021-01-26 ENCOUNTER — Other Ambulatory Visit: Payer: Self-pay

## 2021-01-26 ENCOUNTER — Ambulatory Visit: Payer: Medicare HMO | Admitting: Student

## 2021-01-26 ENCOUNTER — Inpatient Hospital Stay: Payer: Medicare HMO

## 2021-01-26 VITALS — BP 84/66 | HR 104 | Temp 97.7°F | Ht 63.0 in | Wt 121.2 lb

## 2021-01-26 DIAGNOSIS — C3491 Malignant neoplasm of unspecified part of right bronchus or lung: Secondary | ICD-10-CM | POA: Insufficient documentation

## 2021-01-26 DIAGNOSIS — Z79899 Other long term (current) drug therapy: Secondary | ICD-10-CM | POA: Diagnosis not present

## 2021-01-26 DIAGNOSIS — Z510 Encounter for antineoplastic radiation therapy: Secondary | ICD-10-CM | POA: Insufficient documentation

## 2021-01-26 DIAGNOSIS — F17200 Nicotine dependence, unspecified, uncomplicated: Secondary | ICD-10-CM

## 2021-01-26 DIAGNOSIS — C7931 Secondary malignant neoplasm of brain: Secondary | ICD-10-CM | POA: Diagnosis not present

## 2021-01-26 DIAGNOSIS — R69 Illness, unspecified: Secondary | ICD-10-CM | POA: Diagnosis not present

## 2021-01-26 DIAGNOSIS — Z23 Encounter for immunization: Secondary | ICD-10-CM

## 2021-01-26 DIAGNOSIS — C3431 Malignant neoplasm of lower lobe, right bronchus or lung: Secondary | ICD-10-CM | POA: Diagnosis not present

## 2021-01-26 DIAGNOSIS — R0609 Other forms of dyspnea: Secondary | ICD-10-CM | POA: Diagnosis not present

## 2021-01-26 DIAGNOSIS — Z5111 Encounter for antineoplastic chemotherapy: Secondary | ICD-10-CM | POA: Diagnosis not present

## 2021-01-26 LAB — CBC WITH DIFFERENTIAL (CANCER CENTER ONLY)
Abs Immature Granulocytes: 0.03 10*3/uL (ref 0.00–0.07)
Basophils Absolute: 0.1 10*3/uL (ref 0.0–0.1)
Basophils Relative: 1 %
Eosinophils Absolute: 0.2 10*3/uL (ref 0.0–0.5)
Eosinophils Relative: 2 %
HCT: 38 % (ref 36.0–46.0)
Hemoglobin: 12.9 g/dL (ref 12.0–15.0)
Immature Granulocytes: 0 %
Lymphocytes Relative: 18 %
Lymphs Abs: 1.6 10*3/uL (ref 0.7–4.0)
MCH: 32.1 pg (ref 26.0–34.0)
MCHC: 33.9 g/dL (ref 30.0–36.0)
MCV: 94.5 fL (ref 80.0–100.0)
Monocytes Absolute: 0.9 10*3/uL (ref 0.1–1.0)
Monocytes Relative: 10 %
Neutro Abs: 6 10*3/uL (ref 1.7–7.7)
Neutrophils Relative %: 69 %
Platelet Count: 465 10*3/uL — ABNORMAL HIGH (ref 150–400)
RBC: 4.02 MIL/uL (ref 3.87–5.11)
RDW: 12.8 % (ref 11.5–15.5)
WBC Count: 8.8 10*3/uL (ref 4.0–10.5)
nRBC: 0 % (ref 0.0–0.2)

## 2021-01-26 LAB — CMP (CANCER CENTER ONLY)
ALT: 14 U/L (ref 0–44)
AST: 25 U/L (ref 15–41)
Albumin: 3.7 g/dL (ref 3.5–5.0)
Alkaline Phosphatase: 94 U/L (ref 38–126)
Anion gap: 8 (ref 5–15)
BUN: 18 mg/dL (ref 8–23)
CO2: 30 mmol/L (ref 22–32)
Calcium: 9.9 mg/dL (ref 8.9–10.3)
Chloride: 100 mmol/L (ref 98–111)
Creatinine: 0.73 mg/dL (ref 0.44–1.00)
GFR, Estimated: >60 mL/min (ref >60)
Glucose, Bld: 90 mg/dL (ref 70–99)
Potassium: 4.3 mmol/L (ref 3.5–5.1)
Sodium: 138 mmol/L (ref 135–145)
Total Bilirubin: 0.3 mg/dL (ref 0.3–1.2)
Total Protein: 6.5 g/dL (ref 6.5–8.1)

## 2021-01-26 LAB — MAGNESIUM: Magnesium: 2.4 mg/dL (ref 1.7–2.4)

## 2021-01-26 NOTE — Patient Instructions (Signed)
-   pneumonia shot today - flu, covid shots next week - breathing tests next week if you're up to it - if feeling lightheaded/dizzy or develop fever head in to the emergency department

## 2021-01-27 ENCOUNTER — Ambulatory Visit (HOSPITAL_COMMUNITY): Payer: Medicare HMO

## 2021-01-29 ENCOUNTER — Ambulatory Visit (HOSPITAL_COMMUNITY)
Admission: RE | Admit: 2021-01-29 | Discharge: 2021-01-29 | Disposition: A | Discharge status: Home / Self Care | Payer: Medicare HMO | Source: Ambulatory Visit | Attending: Radiation Oncology | Admitting: Radiation Oncology

## 2021-01-29 ENCOUNTER — Other Ambulatory Visit: Payer: Self-pay

## 2021-01-29 ENCOUNTER — Ambulatory Visit (HOSPITAL_COMMUNITY): Payer: Medicare HMO

## 2021-01-29 DIAGNOSIS — R590 Localized enlarged lymph nodes: Secondary | ICD-10-CM | POA: Diagnosis not present

## 2021-01-29 DIAGNOSIS — I700 Atherosclerosis of aorta: Secondary | ICD-10-CM | POA: Diagnosis not present

## 2021-01-29 DIAGNOSIS — J9819 Other pulmonary collapse: Secondary | ICD-10-CM | POA: Diagnosis not present

## 2021-01-29 DIAGNOSIS — C3491 Malignant neoplasm of unspecified part of right bronchus or lung: Secondary | ICD-10-CM | POA: Diagnosis not present

## 2021-01-29 DIAGNOSIS — D259 Leiomyoma of uterus, unspecified: Secondary | ICD-10-CM | POA: Diagnosis not present

## 2021-01-29 DIAGNOSIS — C3490 Malignant neoplasm of unspecified part of unspecified bronchus or lung: Secondary | ICD-10-CM | POA: Diagnosis not present

## 2021-01-29 DIAGNOSIS — J984 Other disorders of lung: Secondary | ICD-10-CM | POA: Diagnosis not present

## 2021-01-29 LAB — GLUCOSE, CAPILLARY: Glucose-Capillary: 101 mg/dL — ABNORMAL HIGH (ref 70–99)

## 2021-01-29 MED ORDER — FLUDEOXYGLUCOSE F - 18 (FDG) INJECTION
5.7000 | Freq: Once | INTRAVENOUS | Status: AC
  Administered 2021-01-29: 5.7 via INTRAVENOUS

## 2021-01-29 MED FILL — Dexamethasone Sodium Phosphate Inj 100 MG/10ML: INTRAMUSCULAR | Qty: 1 | Status: AC

## 2021-01-29 MED FILL — Fosaprepitant Dimeglumine For IV Infusion 150 MG (Base Eq): INTRAVENOUS | Qty: 5 | Status: AC

## 2021-01-29 NOTE — Progress Notes (Signed)
Pharmacist Chemotherapy Monitoring - Initial Assessment    Anticipated start date: 01/30/21   The following has been reviewed per standard work regarding the patient's treatment regimen: The patient's diagnosis, treatment plan and drug doses, and organ/hematologic function Lab orders and baseline tests specific to treatment regimen  The treatment plan start date, drug sequencing, and pre-medications Prior authorization status  Patient's documented medication list, including drug-drug interaction screen and prescriptions for anti-emetics and supportive care specific to the treatment regimen The drug concentrations, fluid compatibility, administration routes, and timing of the medications to be used The patient's access for treatment and lifetime cumulative dose history, if applicable  The patient's medication allergies and previous infusion related reactions, if applicable   Changes made to treatment plan:  N/A  Follow up needed:  N/A   Kennith Center, Pharm.D., CPP 01/29/2021@2 :33 PM

## 2021-01-30 ENCOUNTER — Telehealth: Payer: Self-pay | Admitting: *Deleted

## 2021-01-30 ENCOUNTER — Inpatient Hospital Stay: Payer: Medicare HMO

## 2021-01-30 ENCOUNTER — Encounter: Payer: Self-pay | Admitting: *Deleted

## 2021-01-30 VITALS — BP 148/91 | HR 98 | Temp 98.5°F | Resp 18 | Wt 121.2 lb

## 2021-01-30 DIAGNOSIS — Z5111 Encounter for antineoplastic chemotherapy: Secondary | ICD-10-CM | POA: Diagnosis not present

## 2021-01-30 DIAGNOSIS — C3491 Malignant neoplasm of unspecified part of right bronchus or lung: Secondary | ICD-10-CM | POA: Diagnosis not present

## 2021-01-30 DIAGNOSIS — Z79899 Other long term (current) drug therapy: Secondary | ICD-10-CM | POA: Diagnosis not present

## 2021-01-30 DIAGNOSIS — C7931 Secondary malignant neoplasm of brain: Secondary | ICD-10-CM | POA: Diagnosis not present

## 2021-01-30 MED ORDER — POTASSIUM CHLORIDE IN NACL 20-0.9 MEQ/L-% IV SOLN
Freq: Once | INTRAVENOUS | Status: AC
  Filled 2021-01-30: qty 1000

## 2021-01-30 MED ORDER — SODIUM CHLORIDE 0.9 % IV SOLN: Freq: Once | INTRAVENOUS | Status: AC

## 2021-01-30 MED ORDER — SODIUM CHLORIDE 0.9 % IV SOLN
100.0000 mg/m2 | Freq: Once | INTRAVENOUS | Status: AC
  Administered 2021-01-30: 160 mg via INTRAVENOUS
  Filled 2021-01-30: qty 8

## 2021-01-30 MED ORDER — PALONOSETRON HCL INJECTION 0.25 MG/5ML
0.2500 mg | Freq: Once | INTRAVENOUS | Status: AC
  Administered 2021-01-30: 0.25 mg via INTRAVENOUS
  Filled 2021-01-30: qty 5

## 2021-01-30 MED ORDER — SODIUM CHLORIDE 0.9 % IV SOLN
10.0000 mg | Freq: Once | INTRAVENOUS | Status: AC
  Administered 2021-01-30: 10 mg via INTRAVENOUS
  Filled 2021-01-30: qty 10

## 2021-01-30 MED ORDER — SODIUM CHLORIDE 0.9 % IV SOLN
80.0000 mg/m2 | Freq: Once | INTRAVENOUS | Status: AC
  Administered 2021-01-30: 126 mg via INTRAVENOUS
  Filled 2021-01-30: qty 126

## 2021-01-30 MED ORDER — MAGNESIUM SULFATE 2 GM/50ML IV SOLN
2.0000 g | Freq: Once | INTRAVENOUS | Status: AC
  Administered 2021-01-30: 2 g via INTRAVENOUS
  Filled 2021-01-30: qty 50

## 2021-01-30 MED ORDER — SODIUM CHLORIDE 0.9 % IV SOLN
150.0000 mg | Freq: Once | INTRAVENOUS | Status: AC
  Administered 2021-01-30: 150 mg via INTRAVENOUS
  Filled 2021-01-30: qty 150

## 2021-01-30 MED FILL — Dexamethasone Sodium Phosphate Inj 100 MG/10ML: INTRAMUSCULAR | Qty: 1 | Status: AC

## 2021-01-30 NOTE — Patient Instructions (Signed)
[facility]  Discharge Instructions: Thank you for choosing [facility] to provide your oncology and hematology care.   If you have a lab appointment with the [facility], please go directly to the [facility] and check in at the registration area.   Wear comfortable clothing and clothing appropriate for easy access to any Portacath or PICC line.   We strive to give you quality time with your provider. You may need to reschedule your appointment if you arrive late (15 or more minutes).  Arriving late affects you and other patients whose appointments are after yours.  Also, if you miss three or more appointments without notifying the office, you may be dismissed from the clinic at the provider's discretion.      For prescription refill requests, have your pharmacy contact our office and allow 72 hours for refills to be completed.    Today you received the following chemotherapy and/or immunotherapy agents Cisplatin, Etoposide    To help prevent nausea and vomiting after your treatment, we encourage you to take your nausea medication as directed.  BELOW ARE SYMPTOMS THAT SHOULD BE REPORTED IMMEDIATELY: *FEVER GREATER THAN 100.4 F (38 C) OR HIGHER *CHILLS OR SWEATING *NAUSEA AND VOMITING THAT IS NOT CONTROLLED WITH YOUR NAUSEA MEDICATION *UNUSUAL SHORTNESS OF BREATH *UNUSUAL BRUISING OR BLEEDING *URINARY PROBLEMS (pain or burning when urinating, or frequent urination) *BOWEL PROBLEMS (unusual diarrhea, constipation, pain near the anus) TENDERNESS IN MOUTH AND THROAT WITH OR WITHOUT PRESENCE OF ULCERS (sore throat, sores in mouth, or a toothache) UNUSUAL RASH, SWELLING OR PAIN  UNUSUAL VAGINAL DISCHARGE OR ITCHING   Items with * indicate a potential emergency and should be followed up as soon as possible or go to the Emergency Department if any problems should occur.  Please show the CHEMOTHERAPY ALERT CARD or IMMUNOTHERAPY ALERT CARD at  check-in to the Emergency Department and triage nurse.  Should you have questions after your visit or need to cancel or reschedule your appointment, please contact [facility]  Dept: [phone]  and follow the prompts.  Office hours are 8:00 a.m. to 4:30 p.m. Monday - Friday. Please note that voicemails left after 4:00 p.m. may not be returned until the following business day.  We are closed weekends and major holidays. You have access to a nurse at all times for urgent questions. Please call the main number to the clinic Dept: [phone] and follow the prompts.   For any non-urgent questions, you may also contact your provider using MyChart. We now offer e-Visits for anyone 28 and older to request care online for non-urgent symptoms. For details visit [email or web address].   Also download the MyChart app! Go to the app store, search "MyChart", open the app, select [facility], and log in with your MyChart username and password.  Due to Covid, a mask is required upon entering the hospital/clinic. If you do not have a mask, one will be given to you upon arrival. For doctor visits, patients may have 1 support person aged 50 or older with them. For treatment visits, patients cannot have anyone with them due to current Covid guidelines and our immunocompromised population.

## 2021-01-30 NOTE — Telephone Encounter (Signed)
I received a message from [place] that patient is calling wanting to speak with me. I called him back. He wanted to make sure Dr. Julien Nordmann saw the PET scan. I will update Dr. Julien Nordmann. I also updated that if there were any changes to plan of care that he would get a phone call. He was thankful for the call back.

## 2021-01-30 NOTE — Progress Notes (Signed)
Oncology Nurse Navigator Documentation  Oncology Nurse Navigator Flowsheets 01/30/2021  Abnormal Finding Date 07/27/2020  Confirmed Diagnosis Date 01/16/2021  Diagnosis Status Confirmed Diagnosis Complete  Planned Course of Treatment Chemo/Radiation Concurrent  Phase of Treatment Radiation  Chemotherapy Actual Start Date: 01/30/2021  Radiation Actual Start Date: 02/05/2021  Navigator Follow Up Date: 02/06/2021  Navigator Follow Up Reason: Appointment Review  Navigator Location [facility]  Navigator Encounter Type Treatment  Treatment Initiated Date 01/30/2021  Patient Visit Type MedOnc  Treatment Phase First Chemo Tx  Barriers/Navigation Needs Education  Interventions Psycho-Social Support;Education  Acuity Level 2-Minimal Needs (1-2 Barriers Identified)  Education Method Verbal;Written  Time Spent with Patient 15

## 2021-01-30 NOTE — Telephone Encounter (Signed)
I called patient's husband due to his vm message he left me yesterday.  He is here at the cancer center and I went to talk with him.  He is very nice and we discussed his wife and I clarified questions.

## 2021-01-31 ENCOUNTER — Encounter: Payer: Self-pay | Admitting: Internal Medicine

## 2021-01-31 ENCOUNTER — Ambulatory Visit: Payer: Medicare HMO

## 2021-01-31 ENCOUNTER — Telehealth: Payer: Self-pay

## 2021-01-31 ENCOUNTER — Telehealth: Payer: Self-pay | Admitting: Radiation Oncology

## 2021-01-31 ENCOUNTER — Ambulatory Visit (AMBULATORY_SURGERY_CENTER): Payer: Medicare HMO

## 2021-01-31 ENCOUNTER — Inpatient Hospital Stay: Payer: Medicare HMO

## 2021-01-31 ENCOUNTER — Other Ambulatory Visit: Payer: Self-pay

## 2021-01-31 VITALS — Ht 63.0 in | Wt 128.0 lb

## 2021-01-31 VITALS — BP 130/93 | HR 84 | Temp 98.0°F | Resp 18

## 2021-01-31 DIAGNOSIS — C3491 Malignant neoplasm of unspecified part of right bronchus or lung: Secondary | ICD-10-CM | POA: Diagnosis not present

## 2021-01-31 DIAGNOSIS — Z1211 Encounter for screening for malignant neoplasm of colon: Secondary | ICD-10-CM

## 2021-01-31 DIAGNOSIS — Z5111 Encounter for antineoplastic chemotherapy: Secondary | ICD-10-CM | POA: Diagnosis not present

## 2021-01-31 DIAGNOSIS — Z79899 Other long term (current) drug therapy: Secondary | ICD-10-CM | POA: Diagnosis not present

## 2021-01-31 DIAGNOSIS — C7931 Secondary malignant neoplasm of brain: Secondary | ICD-10-CM | POA: Diagnosis not present

## 2021-01-31 MED ORDER — SODIUM CHLORIDE 0.9 % IV SOLN
100.0000 mg/m2 | Freq: Once | INTRAVENOUS | Status: AC
  Administered 2021-01-31: 160 mg via INTRAVENOUS
  Filled 2021-01-31: qty 8

## 2021-01-31 MED ORDER — SODIUM CHLORIDE 0.9 % IV SOLN: Freq: Once | INTRAVENOUS | Status: AC

## 2021-01-31 MED ORDER — SUTAB 1479-225-188 MG PO TABS: 12.0000 | ORAL_TABLET | ORAL | 0 refills | Status: DC

## 2021-01-31 MED ORDER — SODIUM CHLORIDE 0.9 % IV SOLN
10.0000 mg | Freq: Once | INTRAVENOUS | Status: AC
  Administered 2021-01-31: 10 mg via INTRAVENOUS
  Filled 2021-01-31: qty 10

## 2021-01-31 MED FILL — Dexamethasone Sodium Phosphate Inj 100 MG/10ML: INTRAMUSCULAR | Qty: 1 | Status: AC

## 2021-01-31 NOTE — Telephone Encounter (Signed)
  Dr. Henrene Pastor  This pt was seen in [place] for first colon scheduled for 10/26 by her choice  She is a new dx of lung cancer:- dx 10/27  Chemo was started IV 10/11 with radiation due to 10/17  Tx is expected to continue through 03/2021.  Please advise if you'd like to proceed.  They plan to ask their oncologist as well.  Thank you

## 2021-01-31 NOTE — Progress Notes (Signed)
No allergies to soy or egg Pt is not on blood thinners or diet pills Denies issues with sedation/intubation Denies atrial flutter/fib Denies constipation   Emmi instructions given to pt  Pt is aware of Covid safety and care partner requirements.  During PV pt was very dependent on husband for answers to questions.     Pt just dx with lung cancer 10/27 of this year.  Started IV chemo 01/31/20 and due to start radiation Mon 10/17.  Tx due to continue through December 2022.    This is her first colonoscopy, per her choice  Note sent to Dr. Henrene Pastor  Instructed not to pick up sutab until she hears back from [place].

## 2021-01-31 NOTE — Patient Instructions (Signed)
[facility]  Discharge Instructions: Thank you for choosing [facility] to provide your oncology and hematology care.   If you have a lab appointment with the Cancer Center, please go directly to the Cancer Center and check in at the registration area.   Wear comfortable clothing and clothing appropriate for easy access to any Portacath or PICC line.   We strive to give you quality time with your provider. You may need to reschedule your appointment if you arrive late (15 or more minutes).  Arriving late affects you and other patients whose appointments are after yours.  Also, if you miss three or more appointments without notifying the office, you may be dismissed from the clinic at the provider's discretion.      For prescription refill requests, have your pharmacy contact our office and allow 72 hours for refills to be completed.    Today you received the following chemotherapy and/or immunotherapy agents: etoposide.     To help prevent nausea and vomiting after your treatment, we encourage you to take your nausea medication as directed.  BELOW ARE SYMPTOMS THAT SHOULD BE REPORTED IMMEDIATELY: . *FEVER GREATER THAN 100.4 F (38 C) OR HIGHER . *CHILLS OR SWEATING . *NAUSEA AND VOMITING THAT IS NOT CONTROLLED WITH YOUR NAUSEA MEDICATION . *UNUSUAL SHORTNESS OF BREATH . *UNUSUAL BRUISING OR BLEEDING . *URINARY PROBLEMS (pain or burning when urinating, or frequent urination) . *BOWEL PROBLEMS (unusual diarrhea, constipation, pain near the anus) . TENDERNESS IN MOUTH AND THROAT WITH OR WITHOUT PRESENCE OF ULCERS (sore throat, sores in mouth, or a toothache) . UNUSUAL RASH, SWELLING OR PAIN  . UNUSUAL VAGINAL DISCHARGE OR ITCHING   Items with * indicate a potential emergency and should be followed up as soon as possible or go to the Emergency Department if any problems should occur.  Please show the CHEMOTHERAPY ALERT CARD or IMMUNOTHERAPY ALERT  CARD at check-in to the Emergency Department and triage nurse.  Should you have questions after your visit or need to cancel or reschedule your appointment, please contact [facility]  Dept: [phone]  and follow the prompts.  Office hours are 8:00 a.m. to 4:30 p.m. Monday - Friday. Please note that voicemails left after 4:00 p.m. may not be returned until the following business day.  We are closed weekends and major holidays. You have access to a nurse at all times for urgent questions. Please call the main number to the clinic Dept: [phone] and follow the prompts.   For any non-urgent questions, you may also contact your provider using MyChart. We now offer e-Visits for anyone 18 and older to request care online for non-urgent symptoms. For details visit [email or web address].   Also download the MyChart app! Go to the app store, search "MyChart", open the app, select [facility], and log in with your MyChart username and password.  Due to Covid, a mask is required upon entering the hospital/clinic. If you do not have a mask, one will be given to you upon arrival. For doctor visits, patients may have 1 support person aged 18 or older with them. For treatment visits, patients cannot have anyone with them due to current Covid guidelines and our immunocompromised population.   

## 2021-01-31 NOTE — Telephone Encounter (Signed)
Notified pt that Dr. Henrene Pastor wants to postpone colon until after she has completed treatments for her lung cancer.  Spoke to husband-noting all would be cancelled and for them to call when she is cleared to proceed with procedure.  He voiced understanding.

## 2021-01-31 NOTE — Telephone Encounter (Signed)
I spoke with the patient's husband today to make sure that they knew we had reviewed her PET scan and that Dr. Lisbeth Renshaw felt that we could still proceed with chemoradiation as she does not have metastatic disease.  There is concern for some interstitial disease which she will try to cover as much as possible  for her treatment.  She will begin radiation next Monday.  We are also awaiting the results of her MRI that will be performed on Thursday.

## 2021-01-31 NOTE — Telephone Encounter (Signed)
It would be best that the patient complete her treatment for lung cancer before scheduling routine screening colonoscopy.  Please notify the patient.  Please remove her from the endoscopy schedule.  Thank you

## 2021-02-01 ENCOUNTER — Inpatient Hospital Stay: Payer: Medicare HMO

## 2021-02-01 ENCOUNTER — Ambulatory Visit (HOSPITAL_COMMUNITY)
Admission: RE | Admit: 2021-02-01 | Discharge: 2021-02-01 | Disposition: A | Discharge status: Home / Self Care | Payer: Medicare HMO | Source: Ambulatory Visit | Attending: Critical Care Medicine | Admitting: Critical Care Medicine

## 2021-02-01 ENCOUNTER — Other Ambulatory Visit: Payer: Self-pay

## 2021-02-01 VITALS — BP 156/93 | HR 98 | Temp 98.1°F | Resp 16

## 2021-02-01 DIAGNOSIS — C3490 Malignant neoplasm of unspecified part of unspecified bronchus or lung: Secondary | ICD-10-CM | POA: Insufficient documentation

## 2021-02-01 DIAGNOSIS — Z5111 Encounter for antineoplastic chemotherapy: Secondary | ICD-10-CM | POA: Diagnosis not present

## 2021-02-01 DIAGNOSIS — Z79899 Other long term (current) drug therapy: Secondary | ICD-10-CM | POA: Diagnosis not present

## 2021-02-01 DIAGNOSIS — C3491 Malignant neoplasm of unspecified part of right bronchus or lung: Secondary | ICD-10-CM | POA: Diagnosis not present

## 2021-02-01 DIAGNOSIS — C7931 Secondary malignant neoplasm of brain: Secondary | ICD-10-CM | POA: Diagnosis not present

## 2021-02-01 DIAGNOSIS — G9389 Other specified disorders of brain: Secondary | ICD-10-CM | POA: Diagnosis not present

## 2021-02-01 MED ORDER — GADOBUTROL 1 MMOL/ML IV SOLN
6.5000 mL | Freq: Once | INTRAVENOUS | Status: AC | PRN
  Administered 2021-02-01: 6.5 mL via INTRAVENOUS

## 2021-02-01 MED ORDER — SODIUM CHLORIDE 0.9 % IV SOLN
10.0000 mg | Freq: Once | INTRAVENOUS | Status: AC
  Administered 2021-02-01: 10 mg via INTRAVENOUS
  Filled 2021-02-01: qty 10

## 2021-02-01 MED ORDER — SODIUM CHLORIDE 0.9 % IV SOLN: Freq: Once | INTRAVENOUS | Status: AC

## 2021-02-01 MED ORDER — SODIUM CHLORIDE 0.9 % IV SOLN
100.0000 mg/m2 | Freq: Once | INTRAVENOUS | Status: AC
  Administered 2021-02-01: 160 mg via INTRAVENOUS
  Filled 2021-02-01: qty 8

## 2021-02-01 NOTE — Patient Instructions (Signed)
[facility]  Discharge Instructions: Thank you for choosing [facility] to provide your oncology and hematology care.   If you have a lab appointment with the Cancer Center, please go directly to the Cancer Center and check in at the registration area.   Wear comfortable clothing and clothing appropriate for easy access to any Portacath or PICC line.   We strive to give you quality time with your provider. You may need to reschedule your appointment if you arrive late (15 or more minutes).  Arriving late affects you and other patients whose appointments are after yours.  Also, if you miss three or more appointments without notifying the office, you may be dismissed from the clinic at the provider's discretion.      For prescription refill requests, have your pharmacy contact our office and allow 72 hours for refills to be completed.    Today you received the following chemotherapy and/or immunotherapy agents: etoposide.     To help prevent nausea and vomiting after your treatment, we encourage you to take your nausea medication as directed.  BELOW ARE SYMPTOMS THAT SHOULD BE REPORTED IMMEDIATELY: . *FEVER GREATER THAN 100.4 F (38 C) OR HIGHER . *CHILLS OR SWEATING . *NAUSEA AND VOMITING THAT IS NOT CONTROLLED WITH YOUR NAUSEA MEDICATION . *UNUSUAL SHORTNESS OF BREATH . *UNUSUAL BRUISING OR BLEEDING . *URINARY PROBLEMS (pain or burning when urinating, or frequent urination) . *BOWEL PROBLEMS (unusual diarrhea, constipation, pain near the anus) . TENDERNESS IN MOUTH AND THROAT WITH OR WITHOUT PRESENCE OF ULCERS (sore throat, sores in mouth, or a toothache) . UNUSUAL RASH, SWELLING OR PAIN  . UNUSUAL VAGINAL DISCHARGE OR ITCHING   Items with * indicate a potential emergency and should be followed up as soon as possible or go to the Emergency Department if any problems should occur.  Please show the CHEMOTHERAPY ALERT CARD or IMMUNOTHERAPY ALERT  CARD at check-in to the Emergency Department and triage nurse.  Should you have questions after your visit or need to cancel or reschedule your appointment, please contact [facility]  Dept: [phone]  and follow the prompts.  Office hours are 8:00 a.m. to 4:30 p.m. Monday - Friday. Please note that voicemails left after 4:00 p.m. may not be returned until the following business day.  We are closed weekends and major holidays. You have access to a nurse at all times for urgent questions. Please call the main number to the clinic Dept: [phone] and follow the prompts.   For any non-urgent questions, you may also contact your provider using MyChart. We now offer e-Visits for anyone 18 and older to request care online for non-urgent symptoms. For details visit [email or web address].   Also download the MyChart app! Go to the app store, search "MyChart", open the app, select [facility], and log in with your MyChart username and password.  Due to Covid, a mask is required upon entering the hospital/clinic. If you do not have a mask, one will be given to you upon arrival. For doctor visits, patients may have 1 support person aged 18 or older with them. For treatment visits, patients cannot have anyone with them due to current Covid guidelines and our immunocompromised population.   

## 2021-02-02 ENCOUNTER — Telehealth: Payer: Self-pay

## 2021-02-02 DIAGNOSIS — Z510 Encounter for antineoplastic radiation therapy: Secondary | ICD-10-CM | POA: Diagnosis not present

## 2021-02-02 DIAGNOSIS — C3491 Malignant neoplasm of unspecified part of right bronchus or lung: Secondary | ICD-10-CM | POA: Diagnosis not present

## 2021-02-02 DIAGNOSIS — C3431 Malignant neoplasm of lower lobe, right bronchus or lung: Secondary | ICD-10-CM | POA: Diagnosis not present

## 2021-02-02 NOTE — Telephone Encounter (Signed)
Spoke with Mr Kaleta and he stated that Christy Kelley was doing well. Eating, drinking, and urinating well.  Pt slightly constipated. Last BM 2 days ago.  Suggested that she try 1 capful of Miralax in a hot or cold beverage bid.  They know to call the office if there are any questions or concerns.

## 2021-02-02 NOTE — Telephone Encounter (Signed)
-----   Message from Tildon Husky, RN sent at 01/30/2021  3:43 PM EDT ----- Regarding: FIrst time chemo call - Dr. Julien Nordmann First time chemo follow up cal for Dr. Julien Nordmann. Patient tolerated treatment well. Received etoposide and cisplatin today.

## 2021-02-05 ENCOUNTER — Other Ambulatory Visit (HOSPITAL_COMMUNITY): Payer: Medicare HMO

## 2021-02-05 ENCOUNTER — Other Ambulatory Visit: Payer: Self-pay

## 2021-02-05 ENCOUNTER — Ambulatory Visit
Admission: RE | Admit: 2021-02-05 | Discharge: 2021-02-05 | Disposition: A | Discharge status: Home / Self Care | Payer: Medicare HMO | Source: Ambulatory Visit | Attending: Radiation Oncology | Admitting: Radiation Oncology

## 2021-02-05 DIAGNOSIS — C3431 Malignant neoplasm of lower lobe, right bronchus or lung: Secondary | ICD-10-CM | POA: Diagnosis not present

## 2021-02-05 DIAGNOSIS — Z510 Encounter for antineoplastic radiation therapy: Secondary | ICD-10-CM | POA: Diagnosis not present

## 2021-02-05 DIAGNOSIS — C3491 Malignant neoplasm of unspecified part of right bronchus or lung: Secondary | ICD-10-CM | POA: Diagnosis not present

## 2021-02-06 ENCOUNTER — Ambulatory Visit: Payer: Medicare HMO

## 2021-02-06 ENCOUNTER — Ambulatory Visit
Admission: RE | Admit: 2021-02-06 | Discharge: 2021-02-06 | Disposition: A | Discharge status: Home / Self Care | Payer: Medicare HMO | Source: Ambulatory Visit | Attending: Radiation Oncology | Admitting: Radiation Oncology

## 2021-02-06 DIAGNOSIS — C3431 Malignant neoplasm of lower lobe, right bronchus or lung: Secondary | ICD-10-CM | POA: Diagnosis not present

## 2021-02-06 DIAGNOSIS — Z510 Encounter for antineoplastic radiation therapy: Secondary | ICD-10-CM | POA: Diagnosis not present

## 2021-02-06 DIAGNOSIS — C3491 Malignant neoplasm of unspecified part of right bronchus or lung: Secondary | ICD-10-CM | POA: Diagnosis not present

## 2021-02-07 ENCOUNTER — Inpatient Hospital Stay: Payer: Medicare HMO

## 2021-02-07 ENCOUNTER — Inpatient Hospital Stay: Payer: Medicare HMO | Admitting: Internal Medicine

## 2021-02-07 ENCOUNTER — Other Ambulatory Visit: Payer: Self-pay

## 2021-02-07 ENCOUNTER — Encounter: Payer: Self-pay | Admitting: *Deleted

## 2021-02-07 ENCOUNTER — Ambulatory Visit
Admission: RE | Admit: 2021-02-07 | Discharge: 2021-02-07 | Disposition: A | Discharge status: Home / Self Care | Payer: Medicare HMO | Source: Ambulatory Visit | Attending: Radiation Oncology | Admitting: Radiation Oncology

## 2021-02-07 VITALS — BP 105/77 | HR 109 | Temp 96.8°F | Resp 19 | Ht 63.0 in | Wt 115.8 lb

## 2021-02-07 DIAGNOSIS — Z5111 Encounter for antineoplastic chemotherapy: Secondary | ICD-10-CM | POA: Diagnosis not present

## 2021-02-07 DIAGNOSIS — C3491 Malignant neoplasm of unspecified part of right bronchus or lung: Secondary | ICD-10-CM | POA: Diagnosis not present

## 2021-02-07 DIAGNOSIS — Z79899 Other long term (current) drug therapy: Secondary | ICD-10-CM | POA: Diagnosis not present

## 2021-02-07 DIAGNOSIS — C3490 Malignant neoplasm of unspecified part of unspecified bronchus or lung: Secondary | ICD-10-CM | POA: Diagnosis not present

## 2021-02-07 DIAGNOSIS — C3431 Malignant neoplasm of lower lobe, right bronchus or lung: Secondary | ICD-10-CM | POA: Diagnosis not present

## 2021-02-07 DIAGNOSIS — Z510 Encounter for antineoplastic radiation therapy: Secondary | ICD-10-CM | POA: Diagnosis not present

## 2021-02-07 DIAGNOSIS — C7931 Secondary malignant neoplasm of brain: Secondary | ICD-10-CM | POA: Diagnosis not present

## 2021-02-07 DIAGNOSIS — Z5112 Encounter for antineoplastic immunotherapy: Secondary | ICD-10-CM | POA: Insufficient documentation

## 2021-02-07 LAB — CMP (CANCER CENTER ONLY)
ALT: 19 U/L (ref 0–44)
AST: 27 U/L (ref 15–41)
Albumin: 3.6 g/dL (ref 3.5–5.0)
Alkaline Phosphatase: 73 U/L (ref 38–126)
Anion gap: 12 (ref 5–15)
BUN: 27 mg/dL — ABNORMAL HIGH (ref 8–23)
CO2: 27 mmol/L (ref 22–32)
Calcium: 9.1 mg/dL (ref 8.9–10.3)
Chloride: 97 mmol/L — ABNORMAL LOW (ref 98–111)
Creatinine: 0.74 mg/dL (ref 0.44–1.00)
GFR, Estimated: >60 mL/min (ref >60)
Glucose, Bld: 101 mg/dL — ABNORMAL HIGH (ref 70–99)
Potassium: 3.3 mmol/L — ABNORMAL LOW (ref 3.5–5.1)
Sodium: 136 mmol/L (ref 135–145)
Total Bilirubin: 0.5 mg/dL (ref 0.3–1.2)
Total Protein: 6.2 g/dL — ABNORMAL LOW (ref 6.5–8.1)

## 2021-02-07 LAB — CBC WITH DIFFERENTIAL (CANCER CENTER ONLY)
Abs Immature Granulocytes: 0.02 10*3/uL (ref 0.00–0.07)
Basophils Absolute: 0 10*3/uL (ref 0.0–0.1)
Basophils Relative: 1 %
Eosinophils Absolute: 0 10*3/uL (ref 0.0–0.5)
Eosinophils Relative: 1 %
HCT: 33.4 % — ABNORMAL LOW (ref 36.0–46.0)
Hemoglobin: 11.4 g/dL — ABNORMAL LOW (ref 12.0–15.0)
Immature Granulocytes: 1 %
Lymphocytes Relative: 29 %
Lymphs Abs: 1.1 10*3/uL (ref 0.7–4.0)
MCH: 31.5 pg (ref 26.0–34.0)
MCHC: 34.1 g/dL (ref 30.0–36.0)
MCV: 92.3 fL (ref 80.0–100.0)
Monocytes Absolute: 0.1 10*3/uL (ref 0.1–1.0)
Monocytes Relative: 3 %
Neutro Abs: 2.4 10*3/uL (ref 1.7–7.7)
Neutrophils Relative %: 65 %
Platelet Count: 332 10*3/uL (ref 150–400)
RBC: 3.62 MIL/uL — ABNORMAL LOW (ref 3.87–5.11)
RDW: 12.3 % (ref 11.5–15.5)
Smear Review: NORMAL
WBC Count: 3.7 10*3/uL — ABNORMAL LOW (ref 4.0–10.5)
nRBC: 0 % (ref 0.0–0.2)

## 2021-02-07 LAB — MAGNESIUM: Magnesium: 1.9 mg/dL (ref 1.7–2.4)

## 2021-02-07 MED ORDER — MEGESTROL ACETATE 625 MG/5ML PO SUSP: 625.0000 mg | Freq: Every day | ORAL | 0 refills | Status: DC

## 2021-02-07 MED ORDER — METHYLPREDNISOLONE 4 MG PO TBPK: ORAL_TABLET | ORAL | 0 refills | Status: DC

## 2021-02-07 NOTE — Progress Notes (Signed)
[facility] [employer] [phone]) [phone]   [phone]  OFFICE PROGRESS NOTE  Michela Pitcher, NP [place] [address]  DIAGNOSIS: Extensive stage (T2 a, N2, M1b) small cell lung cancer diagnosed in September 2022 and presented with bulky right hilar mass with mediastinal lymphadenopathy and occlusion of the distal right main bronchus and right lower and middle lobe bronchi by the mass lesion with few subcentimeter brain metastasis.  PRIOR THERAPY: Systemic chemotherapy with cisplatin 80 Mg/M2 on day 1 and etoposide 100 Mg/M2 on days 1, 2 and 3 status post 1 cycle.  This treatment was discontinued after the patient was found to have brain metastasis.  CURRENT THERAPY: Systemic chemotherapy with carboplatin for AUC of 5 on day 1, etoposide 100 Mg/M2 on days 1, 2 and 3 with Cosela before chemotherapy as well as Imfinzi 1500 Mg IV every 3 weeks.  First dose February 20, 2021.   INTERVAL HISTORY: Christy Kelley 70 y.o. female returns to the clinic today for follow-up visit accompanied by her husband.  The patient is complaining of increasing fatigue and weakness as well as confusion at times.  She also has lack of appetite and lost more than 10 pounds since her last visit.  She tolerated the first week of her treatment fairly well except for initial constipation followed by diarrhea after she used MiraLAX and laxative.  She has no current chest pain, shortness of breath, cough or hemoptysis.  She denied having any fever or chills.  She has no nausea, vomiting or abdominal pain.  She completed the staging work-up by having a PET scan as well as MRI of the brain performed recently.  She also started palliative radiotherapy to the chest and completed 3 fractions.  She is here today for evaluation and discussion of her treatment options based on the imaging studies.  MEDICAL HISTORY: Past Medical History:  Diagnosis Date   Arthritis    Lung cancer ([place])    Dx  9/27 with lung cancer    ALLERGIES:  has No Known Allergies.  MEDICATIONS:  Current Outpatient Medications  Medication Sig Dispense Refill   albuterol (VENTOLIN HFA) 108 (90 Base) MCG/ACT inhaler Inhale 2 puffs into the lungs every 6 (six) hours as needed for wheezing or shortness of breath. 1 each 1   Ascorbic Acid (VITAMIN C PO) Take 1,000 mg by mouth daily.     MELATONIN PO Take by mouth.     meloxicam (MOBIC) 15 MG tablet Take 1 tablet (15 mg total) by mouth daily. (Patient taking differently: Take 15 mg by mouth every other day.) 30 tablet 2   Multiple Vitamin (MULTIVITAMIN) tablet Take 1 tablet by mouth daily.     prochlorperazine (COMPAZINE) 10 MG tablet Take 1 tablet (10 mg total) by mouth every 6 (six) hours as needed for nausea or vomiting. 30 tablet 0   Sodium Sulfate-Mag Sulfate-KCl (SUTAB) [phone] MG TABS Take 12 tablets by mouth as directed. 24 tablet 0   Vitamin D, Cholecalciferol, 25 MCG (1000 UT) TABS Take 1,000 Units by mouth daily.     No current facility-administered medications for this visit.    SURGICAL HISTORY:  Past Surgical History:  Procedure Laterality Date   BRONCHIAL WASHINGS  01/18/2021   Procedure: BRONCHIAL WASHINGS;  Surgeon: Julian Hy, DO;  Location: [facility];  Service: Endoscopy;;   ENDOBRONCHIAL ULTRASOUND N/A 01/18/2021   Procedure: ENDOBRONCHIAL ULTRASOUND;  Surgeon: Julian Hy, DO;  Location: [facility];  Service: Endoscopy;  Laterality: N/A;   FINE NEEDLE ASPIRATION  01/18/2021   Procedure: FINE NEEDLE ASPIRATION (FNA) LINEAR;  Surgeon: Julian Hy, DO;  Location: [facility];  Service: Endoscopy;;   VIDEO BRONCHOSCOPY N/A 01/18/2021   Procedure: VIDEO BRONCHOSCOPY WITHOUT FLUORO;  Surgeon: Julian Hy, DO;  Location: [facility];  Service: Endoscopy;  Laterality: N/A;    REVIEW OF SYSTEMS:  Constitutional: positive for anorexia, fatigue, and weight loss Eyes: negative Ears, nose, mouth, throat, and face:  negative Respiratory: positive for cough Cardiovascular: negative Gastrointestinal: positive for diarrhea Genitourinary:negative Integument/breast: negative Hematologic/lymphatic: negative Musculoskeletal:negative Neurological: positive for memory problems Behavioral/Psych: negative Endocrine: negative Allergic/Immunologic: negative   PHYSICAL EXAMINATION: General appearance: alert, cooperative, fatigued, and no distress Head: Normocephalic, without obvious abnormality, atraumatic Neck: no adenopathy, no JVD, supple, symmetrical, trachea midline, and thyroid not enlarged, symmetric, no tenderness/mass/nodules Lymph nodes: Cervical, supraclavicular, and axillary nodes normal. Resp: clear to auscultation bilaterally Back: symmetric, no curvature. ROM normal. No CVA tenderness. Cardio: regular rate and rhythm, S1, S2 normal, no murmur, click, rub or gallop GI: soft, non-tender; bowel sounds normal; no masses,  no organomegaly Extremities: extremities normal, atraumatic, no cyanosis or edema Neurologic: Alert and oriented X 3, normal strength and tone. Normal symmetric reflexes. Normal coordination and gait  ECOG PERFORMANCE STATUS: 1 - Symptomatic but completely ambulatory  Blood pressure 105/77, pulse (!) 109, temperature (!) 96.8 F (36 C), temperature source Tympanic, resp. rate 19, height 5\' 3"  (1.6 m), weight 115 lb 12.8 oz (52.5 kg), SpO2 100 %.  LABORATORY DATA: Lab Results  Component Value Date   WBC 3.7 (L) 02/07/2021   HGB 11.4 (L) 02/07/2021   HCT 33.4 (L) 02/07/2021   MCV 92.3 02/07/2021   PLT 332 02/07/2021      Chemistry      Component Value Date/Time   NA 138 01/26/2021 1129   K 4.3 01/26/2021 1129   CL 100 01/26/2021 1129   CO2 30 01/26/2021 1129   BUN 18 01/26/2021 1129   CREATININE 0.73 01/26/2021 1129      Component Value Date/Time   CALCIUM 9.9 01/26/2021 1129   ALKPHOS 94 01/26/2021 1129   AST 25 01/26/2021 1129   ALT 14 01/26/2021 1129    BILITOT 0.3 01/26/2021 1129       RADIOGRAPHIC STUDIES: DG Chest 2 View  Result Date: 01/16/2021 CLINICAL DATA:  Abnormal chest x-ray. EXAM: CHEST - 2 VIEW COMPARISON:  11/23/2020 FINDINGS: Persistent linear densities in the right perihilar region extending to the periphery of the right lung. These linear densities have decreased since 11/23/2020. However, there may be slightly increased fullness in the right hilum. Remainder of the lungs are clear. Again noted is tenting of the right hemidiaphragm. Heart and mediastinum are within normal limits. No pleural effusions. No acute bone abnormality. IMPRESSION: 1. Linear densities in the right chest have decreased and suggestive for resolving infectious or inflammatory process. However, there may be slightly increased fullness in the right hilum which could be related to the projection of the frontal image since minimal change on the lateral view. Recommend continued follow-up to evaluate this area. 2. No acute chest abnormality. Electronically Signed   By: Markus Daft M.D.   On: 01/16/2021 15:33   CT HEAD WO CONTRAST (5MM)  Result Date: 01/17/2021 CLINICAL DATA:  Pulmonary mass, altered mental status EXAM: CT HEAD WITHOUT CONTRAST TECHNIQUE: Contiguous axial images were obtained from the base of the skull through the vertex without intravenous contrast.  COMPARISON:  07/27/2020 FINDINGS: Brain: Normal anatomic configuration. Parenchymal volume loss is commensurate with the patient's age. Mild periventricular white matter changes are present likely reflecting the sequela of small vessel ischemia. No abnormal intra or extra-axial mass lesion or fluid collection. No abnormal mass effect or midline shift. No evidence of acute intracranial hemorrhage or infarct. Ventricular size is normal. Cerebellum unremarkable. Vascular: No asymmetric hyperdense vasculature at the skull base. Skull: Intact Sinuses/Orbits: Paranasal sinuses are clear. Orbits are unremarkable.  Other: Mastoid air cells and middle ear cavities are clear. IMPRESSION: No acute intracranial abnormality. No evidence of intracranial metastatic disease. Note that MRI examination with contrast has better sensitivity for detection of small intracranial metastases. Mild senescent change. Electronically Signed   By: Fidela Salisbury M.D.   On: 01/17/2021 03:08   CT Angio Chest Pulmonary Embolism (PE) W or WO Contrast  Result Date: 01/16/2021 CLINICAL DATA:  Shortness of breath and wheezing dry cough EXAM: CT ANGIOGRAPHY CHEST WITH CONTRAST TECHNIQUE: Multidetector CT imaging of the chest was performed using the standard protocol during bolus administration of intravenous contrast. Multiplanar CT image reconstructions and MIPs were obtained to evaluate the vascular anatomy. CONTRAST:  66mL OMNIPAQUE IOHEXOL 350 MG/ML SOLN COMPARISON:  Chest x-ray 01/16/2021, CT chest 07/27/2020 FINDINGS: Cardiovascular: Satisfactory opacification of the pulmonary arteries to the segmental level. No evidence of pulmonary embolism. Narrowing of right upper lobe and distal right pulmonary artery by lobulated soft tissue mass. Mild aortic atherosclerosis. No aneurysm. Normal cardiac size. No pericardial effusion. Coronary calcifications and mitral calcifications. Mediastinum/Nodes: Midline trachea. No thyroid mass. Right paratracheal node measures 16 mm. Precarinal lymph node measures 15 mm. Bulky right hilar mass measuring 4.7 by 4.2 by 3.7 cm approximately. Occlusion of the right main bronchus and proximal right lower lobe and middle lobe bronchi 6 with fluid or debris in the right middle and lower lobe bronchi. Lungs/Pleura: Hyperlucent right upper, middle and lower lobes. Largely cleared right upper lobe pneumonia. Peripheral bandlike density in the right upper lobe most likely scarring. Upper Abdomen: Subcentimeter hypodensity in the left hepatic lobe. No acute abnormality. Musculoskeletal: No acute or suspicious osseous  abnormality. Review of the MIP images confirms the above findings. IMPRESSION: 1. Negative for acute pulmonary embolism. 2. Interval finding of bulky right hilar mass/neoplasm with narrowing of the distal right pulmonary artery and right upper lobe pulmonary vessels. Occlusion of distal right main bronchus and right lower and middle lobe bronchi by mass lesion. Fluid and or debris within distal right lower and middle lobe bronchi. There are multiple enlarged mediastinal lymph nodes. 3. Largely cleared right upper lobe pneumonia with peripheral bandlike density in the right upper lobe likely scarring. Aortic Atherosclerosis (ICD10-I70.0). Electronically Signed   By: Donavan Foil M.D.   On: 01/16/2021 23:22   MR BRAIN W WO CONTRAST  Result Date: 02/03/2021 CLINICAL DATA:  Small cell lung cancer, staging EXAM: MRI HEAD WITHOUT AND WITH CONTRAST TECHNIQUE: Multiplanar, multiecho pulse sequences of the brain and surrounding structures were obtained without and with intravenous contrast. CONTRAST:  6.72mL GADAVIST GADOBUTROL 1 MMOL/ML IV SOLN COMPARISON:  No prior MRI of the brain, correlation is made with CT head 01/17/2021 FINDINGS: Brain: 3 mm enhancing lesion in the cingulate gyrus (series 16, image 110), with mild associated edema. 4 mm lesion, in the left medial parietal lobe (series 16, image 101). 4 mm enhancing lesion in the lateral left parietal cortex (series 16, image 88). Possible 4 mm enhancing lesion in the lateral left temporal lobe (series  16, image 85). Possible punctate lesion in right anterior frontal lobe (series 16, image 109). No acute infarct, hemorrhage, mass effect, or midline shift. No hydrocephalus. No extra-axial collection. The basal cisterns are preserved. T2 hyperintense signal in the periventricular white matter, likely the sequela of chronic small vessel ischemic disease. Vascular: Normal flow voids. Skull and upper cervical spine: Normal marrow signal. Sinuses/Orbits: Negative.  Other: The mastoids are well aerated. IMPRESSION: Multiple 3 and 4 mm enhancing lesions in the cerebral hemispheres, concerning for metastatic disease. Electronically Signed   By: Merilyn Baba M.D.   On: 02/03/2021 18:24   NM PET Image Initial (PI) Skull Base To Thigh  Result Date: 01/30/2021 CLINICAL DATA:  Initial treatment strategy for small-cell lung cancer. EXAM: NUCLEAR MEDICINE PET SKULL BASE TO THIGH TECHNIQUE: 5.7 mCi F-18 FDG was injected intravenously. Full-ring PET imaging was performed from the skull base to thigh after the radiotracer. CT data was obtained and used for attenuation correction and anatomic localization. Fasting blood glucose: 101 mg/dl COMPARISON:  CTA Chest 01/16/2021 FINDINGS: Mediastinal blood pool activity: SUV max 2.1 Liver activity: SUV max NA NECK: No hypermetabolic lymph nodes in the neck. Incidental CT findings: None. CHEST: The patient's known bulky central right lung mass is markedly hypermetabolic with SUV max = 38.2. 16 mm short axis right paratracheal node on 50/5 is hypermetabolic with SUV max = 6.2. Hypermetabolic node posterior to the carina demonstrates SUV max = 8.7. No hypermetabolism in the left hilum or axillary regions. Low level FDG uptake identified in the collapse consolidation seen in the right lower lobe. Incidental CT findings: Volume loss right hemithorax with right lower lobe collapse/consolidation. There is diffuse interstitial and nodular disease visible within the aerated portions of the right mid and lower lung consistent with metastatic involvement. ABDOMEN/PELVIS: No abnormal hypermetabolic activity within the liver, pancreas, adrenal glands, or spleen. No hypermetabolic lymph nodes in the abdomen or pelvis. Incidental CT findings: Tiny hypodensities in the liver parenchyma are too small to characterize. These show no hypermetabolism on PET imaging and are most likely benign, and probably cysts. There is mild atherosclerotic calcification of the  abdominal aorta without aneurysm. Calcified fibroids noted in the uterus. SKELETON: No focal hypermetabolic activity to suggest skeletal metastasis. Incidental CT findings: No worrisome lytic or sclerotic osseous abnormality. IMPRESSION: 1. The central right lung mass is markedly hypermetabolic, consistent with the patient's known primary neoplasm. 2. Low level FDG accumulation is identified in the interstitial and nodular disease involving the right lower lung, suspicious for metastatic disease/lymphangitic tumor spread. 3. Hypermetabolic central mediastinal lymphadenopathy consistent with metastatic disease. No evidence for hypermetabolism in the left hilum. 4. No hypermetabolic metastatic disease in the neck, abdomen, or pelvis. 5.  Aortic Atherosclerois (ICD10-170.0) Electronically Signed   By: Misty Stanley M.D.   On: 01/30/2021 08:57   DG CHEST PORT 1 VIEW  Result Date: 01/18/2021 CLINICAL DATA:  Bronchoscopy EXAM: PORTABLE CHEST 1 VIEW COMPARISON:  01/16/2021, CT 01/16/2021 FINDINGS: Linear atelectasis in the right mid lung. Right hilar mass redemonstrated. No pneumothorax. Stable cardiomediastinal silhouette. No focal opacity. Possible tiny right pleural effusion IMPRESSION: 1. Right hilar mass redemonstrated. There is trace right effusion but no visible pneumothorax Electronically Signed   By: Donavan Foil M.D.   On: 01/18/2021 16:27    ASSESSMENT AND PLAN: This is a very pleasant 70 years old white female recently diagnosed with extensive stage (T2 a, N2, M1b) small cell lung cancer presented with bulky right hilar mass and mediastinal lymphadenopathy  with occlusion of the distal right mainstem bronchus and right lower lobe bronchus as well as new subcentimeter brain metastasis diagnosed in September 2022. The patient started systemic chemotherapy with cisplatin and etoposide last week and tolerated the first week of her treatment fairly well except for increasing fatigue and weakness as well as  diarrhea, lack of appetite and weight loss. Unfortunately the staging work-up showed multiple subcentimeter brain metastasis.  The PET scan showed no evidence of metastatic disease outside the previously known areas in the chest. I had a lengthy discussion with the patient and her husband about her current condition, prognosis based on the new staging work-up as well as future treatment options.  They were offered the option of palliative care as well as palliative systemic chemotherapy but changing the regimen to carboplatin for AUC of 5 on day 1 and etoposide 100 Mg/M2 on days 1, 2 and 3 with Cosela 240 Mg/M2 before chemotherapy.  In addition we will add Imfinzi 1500 Mg IV every 3 weeks during the chemotherapy followed by maintenance every 4 weeks if the patient has no evidence for disease progression. The patient and her husband are interested in proceeding with the systemic chemotherapy and she is expected to start the first cycle of this treatment on February 20, 2021. Will discuss with Dr. Lisbeth Renshaw terminating the radiotherapy to a palliative course in around 3 weeks.  We will also continue to monitor the subcentimeter brain metastasis for now and repeating MRI of the brain in around 6-8 weeks and if there is any evidence for progression, she would definitely benefit from whole brain irradiation at that time or after completion of the 4 cycles of chemotherapy. For the lack of appetite, I will start the patient on Medrol Dosepak in addition to Megace ES 625 mg p.o. daily. She will come back for follow-up visit in 2 weeks for evaluation before starting her first cycle of the new chemotherapy regimen. She was advised to call immediately if she has any other concerning symptoms in the interval. The patient voices understanding of current disease status and treatment options and is in agreement with the current care plan.  All questions were answered. The patient knows to call the clinic with any problems,  questions or concerns. We can certainly see the patient much sooner if necessary.  The total time spent in the appointment was 40 minutes.  Disclaimer: This note was dictated with voice recognition software. Similar sounding words can inadvertently be transcribed and may not be corrected upon review.

## 2021-02-07 NOTE — Progress Notes (Signed)
DISCONTINUE ON PATHWAY REGIMEN - Small Cell Lung     A cycle is every 21 days:     Etoposide      Cisplatin   **Always confirm dose/schedule in your pharmacy ordering system**  REASON: Other Reason PRIOR TREATMENT: LOS15: Cisplatin 75 mg/m2 D1 + Etoposide 100 mg/m2 IV D1-3 q21 Days x 4 Cycles with Concurrent Radiation TREATMENT RESPONSE: Unable to Evaluate  START ON PATHWAY REGIMEN - Small Cell Lung     Cycles 1 through 4: A cycle is every 21 days:     Durvalumab      Carboplatin      Etoposide    Cycles 5 and beyond: A cycle is every 28 days:     Durvalumab   **Always confirm dose/schedule in your pharmacy ordering system**  Patient Characteristics: Newly Diagnosed, Preoperative or Nonsurgical Candidate (Clinical Staging), First Line, Extensive Stage Therapeutic Status: Newly Diagnosed, Preoperative or Nonsurgical Candidate (Clinical Staging) AJCC T Category: cT2b AJCC N Category: cN2 AJCC M Category: cM1b AJCC 8 Stage Grouping: IVA Stage Classification: Extensive  Intent of Therapy: Non-Curative / Palliative Intent, Discussed with Patient

## 2021-02-07 NOTE — Progress Notes (Signed)
Per Dr. Julien Nordmann, I notified rad onc team of mri brain.

## 2021-02-08 ENCOUNTER — Ambulatory Visit
Admission: RE | Admit: 2021-02-08 | Discharge: 2021-02-08 | Disposition: A | Discharge status: Home / Self Care | Payer: Medicare HMO | Source: Ambulatory Visit | Attending: Radiation Oncology | Admitting: Radiation Oncology

## 2021-02-08 DIAGNOSIS — Z510 Encounter for antineoplastic radiation therapy: Secondary | ICD-10-CM | POA: Diagnosis not present

## 2021-02-08 DIAGNOSIS — C3431 Malignant neoplasm of lower lobe, right bronchus or lung: Secondary | ICD-10-CM | POA: Diagnosis not present

## 2021-02-08 DIAGNOSIS — C3491 Malignant neoplasm of unspecified part of right bronchus or lung: Secondary | ICD-10-CM | POA: Diagnosis not present

## 2021-02-08 MED ORDER — SONAFINE EX EMUL
1.0000 | Freq: Once | CUTANEOUS | Status: AC
Start: 2021-02-08 — End: 2021-02-08
  Administered 2021-02-08: 1 via TOPICAL

## 2021-02-08 NOTE — Progress Notes (Signed)
Pt here for patient teaching.  Pt given Radiation and You booklet, skin care instructions, and Sonafine.  Reviewed areas of pertinence such as fatigue, hair loss, skin changes, throat changes, cough, and shortness of breath . Pt able to give teach back of to pat skin and use unscented/gentle soap,apply Sonafine bid and avoid applying anything to skin within 4 hours of treatment. Pt verbalizes understanding of information given and will contact nursing with any questions or concerns.  Husband was present for teaching.   [email or web address]  Gloriajean Dell. Leonie Green, BSN

## 2021-02-09 ENCOUNTER — Ambulatory Visit
Admission: RE | Admit: 2021-02-09 | Discharge: 2021-02-09 | Disposition: A | Discharge status: Home / Self Care | Payer: Medicare HMO | Source: Ambulatory Visit | Attending: Radiation Oncology | Admitting: Radiation Oncology

## 2021-02-09 ENCOUNTER — Other Ambulatory Visit: Payer: Self-pay | Admitting: Internal Medicine

## 2021-02-09 ENCOUNTER — Telehealth: Payer: Self-pay | Admitting: Internal Medicine

## 2021-02-09 ENCOUNTER — Other Ambulatory Visit: Payer: Self-pay

## 2021-02-09 DIAGNOSIS — C3431 Malignant neoplasm of lower lobe, right bronchus or lung: Secondary | ICD-10-CM | POA: Diagnosis not present

## 2021-02-09 DIAGNOSIS — C3491 Malignant neoplasm of unspecified part of right bronchus or lung: Secondary | ICD-10-CM | POA: Diagnosis not present

## 2021-02-09 DIAGNOSIS — Z510 Encounter for antineoplastic radiation therapy: Secondary | ICD-10-CM | POA: Diagnosis not present

## 2021-02-09 NOTE — Telephone Encounter (Signed)
Scheduled follow-up appointments per 10/19 los. Patient's husband is aware.

## 2021-02-12 ENCOUNTER — Other Ambulatory Visit: Payer: Self-pay

## 2021-02-12 ENCOUNTER — Ambulatory Visit: Payer: Medicare HMO

## 2021-02-12 ENCOUNTER — Ambulatory Visit
Admission: RE | Admit: 2021-02-12 | Discharge: 2021-02-12 | Disposition: A | Discharge status: Home / Self Care | Payer: Medicare HMO | Source: Ambulatory Visit | Attending: Radiation Oncology | Admitting: Radiation Oncology

## 2021-02-12 DIAGNOSIS — Z510 Encounter for antineoplastic radiation therapy: Secondary | ICD-10-CM | POA: Diagnosis not present

## 2021-02-12 DIAGNOSIS — C3431 Malignant neoplasm of lower lobe, right bronchus or lung: Secondary | ICD-10-CM | POA: Diagnosis not present

## 2021-02-12 DIAGNOSIS — C3491 Malignant neoplasm of unspecified part of right bronchus or lung: Secondary | ICD-10-CM | POA: Diagnosis not present

## 2021-02-13 ENCOUNTER — Ambulatory Visit
Admission: RE | Admit: 2021-02-13 | Discharge: 2021-02-13 | Disposition: A | Discharge status: Home / Self Care | Payer: Medicare HMO | Source: Ambulatory Visit | Attending: Radiation Oncology | Admitting: Radiation Oncology

## 2021-02-13 ENCOUNTER — Inpatient Hospital Stay: Payer: Medicare HMO

## 2021-02-13 ENCOUNTER — Ambulatory Visit: Payer: Medicare HMO

## 2021-02-13 DIAGNOSIS — C3491 Malignant neoplasm of unspecified part of right bronchus or lung: Secondary | ICD-10-CM

## 2021-02-13 DIAGNOSIS — Z79899 Other long term (current) drug therapy: Secondary | ICD-10-CM | POA: Diagnosis not present

## 2021-02-13 DIAGNOSIS — C3431 Malignant neoplasm of lower lobe, right bronchus or lung: Secondary | ICD-10-CM | POA: Diagnosis not present

## 2021-02-13 DIAGNOSIS — Z5111 Encounter for antineoplastic chemotherapy: Secondary | ICD-10-CM | POA: Diagnosis not present

## 2021-02-13 DIAGNOSIS — Z510 Encounter for antineoplastic radiation therapy: Secondary | ICD-10-CM | POA: Diagnosis not present

## 2021-02-13 DIAGNOSIS — C7931 Secondary malignant neoplasm of brain: Secondary | ICD-10-CM | POA: Diagnosis not present

## 2021-02-13 LAB — CBC WITH DIFFERENTIAL (CANCER CENTER ONLY)
Abs Immature Granulocytes: 0.01 10*3/uL (ref 0.00–0.07)
Basophils Absolute: 0 10*3/uL (ref 0.0–0.1)
Basophils Relative: 0 %
Eosinophils Absolute: 0.1 10*3/uL (ref 0.0–0.5)
Eosinophils Relative: 2 %
HCT: 33.7 % — ABNORMAL LOW (ref 36.0–46.0)
Hemoglobin: 11.3 g/dL — ABNORMAL LOW (ref 12.0–15.0)
Immature Granulocytes: 0 %
Lymphocytes Relative: 48 %
Lymphs Abs: 1.2 10*3/uL (ref 0.7–4.0)
MCH: 31.9 pg (ref 26.0–34.0)
MCHC: 33.5 g/dL (ref 30.0–36.0)
MCV: 95.2 fL (ref 80.0–100.0)
Monocytes Absolute: 0.2 10*3/uL (ref 0.1–1.0)
Monocytes Relative: 8 %
Neutro Abs: 1 10*3/uL — ABNORMAL LOW (ref 1.7–7.7)
Neutrophils Relative %: 42 %
Platelet Count: 212 10*3/uL (ref 150–400)
RBC: 3.54 MIL/uL — ABNORMAL LOW (ref 3.87–5.11)
RDW: 12.7 % (ref 11.5–15.5)
Smear Review: NORMAL
WBC Count: 2.5 10*3/uL — ABNORMAL LOW (ref 4.0–10.5)
nRBC: 0 % (ref 0.0–0.2)

## 2021-02-13 LAB — CMP (CANCER CENTER ONLY)
ALT: 13 U/L (ref 0–44)
AST: 22 U/L (ref 15–41)
Albumin: 3.6 g/dL (ref 3.5–5.0)
Alkaline Phosphatase: 69 U/L (ref 38–126)
Anion gap: 8 (ref 5–15)
BUN: 22 mg/dL (ref 8–23)
CO2: 29 mmol/L (ref 22–32)
Calcium: 9.1 mg/dL (ref 8.9–10.3)
Chloride: 102 mmol/L (ref 98–111)
Creatinine: 0.71 mg/dL (ref 0.44–1.00)
GFR, Estimated: >60 mL/min (ref >60)
Glucose, Bld: 83 mg/dL (ref 70–99)
Potassium: 3.8 mmol/L (ref 3.5–5.1)
Sodium: 139 mmol/L (ref 135–145)
Total Bilirubin: 0.2 mg/dL — ABNORMAL LOW (ref 0.3–1.2)
Total Protein: 6 g/dL — ABNORMAL LOW (ref 6.5–8.1)

## 2021-02-13 LAB — TSH: TSH: 0.655 u[IU]/mL (ref 0.308–3.960)

## 2021-02-13 LAB — MAGNESIUM: Magnesium: 1.9 mg/dL (ref 1.7–2.4)

## 2021-02-13 NOTE — Progress Notes (Signed)
Pharmacist Chemotherapy Monitoring - Initial Assessment    Anticipated start date: 02/20/21   The following has been reviewed per standard work regarding the patient's treatment regimen: The patient's diagnosis, treatment plan and drug doses, and organ/hematologic function Lab orders and baseline tests specific to treatment regimen  The treatment plan start date, drug sequencing, and pre-medications Prior authorization status  Patient's documented medication list, including drug-drug interaction screen and prescriptions for anti-emetics and supportive care specific to the treatment regimen The drug concentrations, fluid compatibility, administration routes, and timing of the medications to be used The patient's access for treatment and lifetime cumulative dose history, if applicable  The patient's medication allergies and previous infusion related reactions, if applicable   Changes made to treatment plan:  N/A  Follow up needed:  Pending authorization for treatment     Kennith Center, Pharm.D., CPP 02/13/2021@1 :35 PM

## 2021-02-14 ENCOUNTER — Other Ambulatory Visit: Payer: Self-pay

## 2021-02-14 ENCOUNTER — Ambulatory Visit: Payer: Medicare HMO

## 2021-02-14 ENCOUNTER — Ambulatory Visit
Admission: RE | Admit: 2021-02-14 | Discharge: 2021-02-14 | Disposition: A | Discharge status: Home / Self Care | Payer: Medicare HMO | Source: Ambulatory Visit | Attending: Radiation Oncology | Admitting: Radiation Oncology

## 2021-02-14 ENCOUNTER — Encounter: Payer: Medicare HMO | Admitting: Internal Medicine

## 2021-02-14 DIAGNOSIS — C3491 Malignant neoplasm of unspecified part of right bronchus or lung: Secondary | ICD-10-CM | POA: Diagnosis not present

## 2021-02-14 DIAGNOSIS — C3431 Malignant neoplasm of lower lobe, right bronchus or lung: Secondary | ICD-10-CM | POA: Diagnosis not present

## 2021-02-14 DIAGNOSIS — Z510 Encounter for antineoplastic radiation therapy: Secondary | ICD-10-CM | POA: Diagnosis not present

## 2021-02-15 ENCOUNTER — Ambulatory Visit
Admission: RE | Admit: 2021-02-15 | Discharge: 2021-02-15 | Disposition: A | Discharge status: Home / Self Care | Payer: Medicare HMO | Source: Ambulatory Visit | Attending: Radiation Oncology | Admitting: Radiation Oncology

## 2021-02-15 ENCOUNTER — Ambulatory Visit: Payer: Medicare HMO

## 2021-02-15 DIAGNOSIS — C3431 Malignant neoplasm of lower lobe, right bronchus or lung: Secondary | ICD-10-CM | POA: Diagnosis not present

## 2021-02-15 DIAGNOSIS — C3491 Malignant neoplasm of unspecified part of right bronchus or lung: Secondary | ICD-10-CM | POA: Diagnosis not present

## 2021-02-15 DIAGNOSIS — Z510 Encounter for antineoplastic radiation therapy: Secondary | ICD-10-CM | POA: Diagnosis not present

## 2021-02-16 ENCOUNTER — Ambulatory Visit: Payer: Medicare HMO

## 2021-02-16 ENCOUNTER — Other Ambulatory Visit: Payer: Self-pay

## 2021-02-16 ENCOUNTER — Ambulatory Visit
Admission: RE | Admit: 2021-02-16 | Discharge: 2021-02-16 | Disposition: A | Discharge status: Home / Self Care | Payer: Medicare HMO | Source: Ambulatory Visit | Attending: Radiation Oncology | Admitting: Radiation Oncology

## 2021-02-16 DIAGNOSIS — C3491 Malignant neoplasm of unspecified part of right bronchus or lung: Secondary | ICD-10-CM | POA: Diagnosis not present

## 2021-02-16 DIAGNOSIS — C3431 Malignant neoplasm of lower lobe, right bronchus or lung: Secondary | ICD-10-CM | POA: Diagnosis not present

## 2021-02-16 DIAGNOSIS — Z510 Encounter for antineoplastic radiation therapy: Secondary | ICD-10-CM | POA: Diagnosis not present

## 2021-02-19 ENCOUNTER — Other Ambulatory Visit: Payer: Self-pay

## 2021-02-19 ENCOUNTER — Ambulatory Visit
Admission: RE | Admit: 2021-02-19 | Discharge: 2021-02-19 | Disposition: A | Discharge status: Home / Self Care | Payer: Medicare HMO | Source: Ambulatory Visit | Attending: Radiation Oncology | Admitting: Radiation Oncology

## 2021-02-19 DIAGNOSIS — C3491 Malignant neoplasm of unspecified part of right bronchus or lung: Secondary | ICD-10-CM | POA: Diagnosis not present

## 2021-02-19 DIAGNOSIS — Z510 Encounter for antineoplastic radiation therapy: Secondary | ICD-10-CM | POA: Diagnosis not present

## 2021-02-19 DIAGNOSIS — C3431 Malignant neoplasm of lower lobe, right bronchus or lung: Secondary | ICD-10-CM | POA: Diagnosis not present

## 2021-02-19 MED FILL — Fosaprepitant Dimeglumine For IV Infusion 150 MG (Base Eq): INTRAVENOUS | Qty: 5 | Status: AC

## 2021-02-19 MED FILL — Dexamethasone Sodium Phosphate Inj 100 MG/10ML: INTRAMUSCULAR | Qty: 1 | Status: AC

## 2021-02-20 ENCOUNTER — Inpatient Hospital Stay: Payer: Medicare HMO

## 2021-02-20 ENCOUNTER — Inpatient Hospital Stay: Payer: Medicare HMO | Attending: Internal Medicine

## 2021-02-20 ENCOUNTER — Inpatient Hospital Stay: Payer: Medicare HMO | Admitting: Internal Medicine

## 2021-02-20 ENCOUNTER — Ambulatory Visit
Admission: RE | Admit: 2021-02-20 | Discharge: 2021-02-20 | Disposition: A | Discharge status: Home / Self Care | Payer: Medicare HMO | Source: Ambulatory Visit | Attending: Radiation Oncology | Admitting: Radiation Oncology

## 2021-02-20 ENCOUNTER — Other Ambulatory Visit: Payer: Self-pay | Admitting: Internal Medicine

## 2021-02-20 ENCOUNTER — Other Ambulatory Visit: Payer: Self-pay | Admitting: Physician Assistant

## 2021-02-20 ENCOUNTER — Encounter: Payer: Self-pay | Admitting: Internal Medicine

## 2021-02-20 DIAGNOSIS — D701 Agranulocytosis secondary to cancer chemotherapy: Secondary | ICD-10-CM | POA: Diagnosis not present

## 2021-02-20 DIAGNOSIS — Z5111 Encounter for antineoplastic chemotherapy: Secondary | ICD-10-CM | POA: Diagnosis present

## 2021-02-20 DIAGNOSIS — T451X5A Adverse effect of antineoplastic and immunosuppressive drugs, initial encounter: Secondary | ICD-10-CM | POA: Diagnosis not present

## 2021-02-20 DIAGNOSIS — C3401 Malignant neoplasm of right main bronchus: Secondary | ICD-10-CM | POA: Diagnosis present

## 2021-02-20 DIAGNOSIS — C7931 Secondary malignant neoplasm of brain: Secondary | ICD-10-CM | POA: Insufficient documentation

## 2021-02-20 DIAGNOSIS — Z79899 Other long term (current) drug therapy: Secondary | ICD-10-CM | POA: Insufficient documentation

## 2021-02-20 DIAGNOSIS — C3491 Malignant neoplasm of unspecified part of right bronchus or lung: Secondary | ICD-10-CM

## 2021-02-20 DIAGNOSIS — C3431 Malignant neoplasm of lower lobe, right bronchus or lung: Secondary | ICD-10-CM | POA: Diagnosis not present

## 2021-02-20 DIAGNOSIS — Z510 Encounter for antineoplastic radiation therapy: Secondary | ICD-10-CM | POA: Insufficient documentation

## 2021-02-20 LAB — CMP (CANCER CENTER ONLY)
ALT: 12 U/L (ref 0–44)
AST: 22 U/L (ref 15–41)
Albumin: 3.2 g/dL — ABNORMAL LOW (ref 3.5–5.0)
Alkaline Phosphatase: 66 U/L (ref 38–126)
Anion gap: 6 (ref 5–15)
BUN: 13 mg/dL (ref 8–23)
CO2: 29 mmol/L (ref 22–32)
Calcium: 8.5 mg/dL — ABNORMAL LOW (ref 8.9–10.3)
Chloride: 104 mmol/L (ref 98–111)
Creatinine: 0.65 mg/dL (ref 0.44–1.00)
GFR, Estimated: >60 mL/min (ref >60)
Glucose, Bld: 74 mg/dL (ref 70–99)
Potassium: 4.2 mmol/L (ref 3.5–5.1)
Sodium: 139 mmol/L (ref 135–145)
Total Bilirubin: 0.3 mg/dL (ref 0.3–1.2)
Total Protein: 5.5 g/dL — ABNORMAL LOW (ref 6.5–8.1)

## 2021-02-20 LAB — CBC WITH DIFFERENTIAL (CANCER CENTER ONLY)
Abs Immature Granulocytes: 0 10*3/uL (ref 0.00–0.07)
Basophils Absolute: 0.1 10*3/uL (ref 0.0–0.1)
Basophils Relative: 4 %
Eosinophils Absolute: 0 10*3/uL (ref 0.0–0.5)
Eosinophils Relative: 1 %
HCT: 31.8 % — ABNORMAL LOW (ref 36.0–46.0)
Hemoglobin: 10.8 g/dL — ABNORMAL LOW (ref 12.0–15.0)
Lymphocytes Relative: 64 %
Lymphs Abs: 1 10*3/uL (ref 0.7–4.0)
MCH: 32.2 pg (ref 26.0–34.0)
MCHC: 34 g/dL (ref 30.0–36.0)
MCV: 94.9 fL (ref 80.0–100.0)
Metamyelocytes Relative: 2 %
Monocytes Absolute: 0.3 10*3/uL (ref 0.1–1.0)
Monocytes Relative: 17 %
Neutro Abs: 0.2 10*3/uL — CL (ref 1.7–7.7)
Neutrophils Relative %: 12 %
Platelet Count: 348 10*3/uL (ref 150–400)
RBC: 3.35 MIL/uL — ABNORMAL LOW (ref 3.87–5.11)
RDW: 14.1 % (ref 11.5–15.5)
Smear Review: NORMAL
WBC Count: 1.6 10*3/uL — ABNORMAL LOW (ref 4.0–10.5)
nRBC: 0 % (ref 0.0–0.2)

## 2021-02-20 LAB — TSH: TSH: 0.57 u[IU]/mL (ref 0.308–3.960)

## 2021-02-20 LAB — MAGNESIUM: Magnesium: 1.9 mg/dL (ref 1.7–2.4)

## 2021-02-20 MED ORDER — TEMAZEPAM 15 MG PO CAPS: 15.0000 mg | ORAL_CAPSULE | Freq: Every evening | ORAL | 0 refills | Status: AC | PRN

## 2021-02-20 MED FILL — Dexamethasone Sodium Phosphate Inj 100 MG/10ML: INTRAMUSCULAR | Qty: 1 | Status: AC

## 2021-02-20 NOTE — Progress Notes (Signed)
[place] [phone]   [phone]  OFFICE PROGRESS NOTE  Michela Pitcher, NP [place] [address]  DIAGNOSIS: Extensive stage (T2 a, N2, M1b) small cell lung cancer diagnosed in September 2022 and presented with bulky right hilar mass with mediastinal lymphadenopathy and occlusion of the distal right main bronchus and right lower and middle lobe bronchi by the mass lesion with few subcentimeter brain metastasis.  PRIOR THERAPY: Systemic chemotherapy with cisplatin 80 Mg/M2 on day 1 and etoposide 100 Mg/M2 on days 1, 2 and 3 status post 1 cycle.  This treatment was discontinued after the patient was found to have brain metastasis.  CURRENT THERAPY: Systemic chemotherapy with carboplatin for AUC of 5 on day 1, etoposide 100 Mg/M2 on days 1, 2 and 3 with Cosela before chemotherapy as well as Imfinzi 1500 Mg IV every 3 weeks.  First dose February 20, 2021.   INTERVAL HISTORY: Christy Kelley 70 y.o. female returns to the clinic today for follow-up visit.  The patient is feeling fine today with no concerning complaints except for insomnia.  She denied having any current chest pain, shortness of breath, cough or hemoptysis.  She denied having any fever or chills.  She has no nausea, vomiting, diarrhea or constipation.  She has no headache or visual changes.  She is recovering well from the first cycle of her treatment.  She is here today for evaluation before starting the first cycle of her systemic chemotherapy with carboplatin, etoposide, Cosela and Imfinzi.  MEDICAL HISTORY: Past Medical History:  Diagnosis Date   Arthritis    Lung cancer ([place])    Dx 9/27 with lung cancer    ALLERGIES:  has No Known Allergies.  MEDICATIONS:  Current Outpatient Medications  Medication Sig Dispense Refill   albuterol (VENTOLIN HFA) 108 (90 Base) MCG/ACT inhaler Inhale 2 puffs into the lungs every 6 (six) hours as needed for wheezing or shortness  of breath. 1 each 1   Ascorbic Acid (VITAMIN C PO) Take 1,000 mg by mouth daily.     megestrol (MEGACE ES) 625 MG/5ML suspension Take 5 mLs (625 mg total) by mouth daily. 150 mL 0   MELATONIN PO Take by mouth.     meloxicam (MOBIC) 15 MG tablet Take 1 tablet (15 mg total) by mouth daily. (Patient taking differently: Take 15 mg by mouth every other day.) 30 tablet 2   methylPREDNISolone (MEDROL DOSEPAK) 4 MG TBPK tablet Use as instructed 21 tablet 0   Multiple Vitamin (MULTIVITAMIN) tablet Take 1 tablet by mouth daily.     prochlorperazine (COMPAZINE) 10 MG tablet Take 1 tablet (10 mg total) by mouth every 6 (six) hours as needed for nausea or vomiting. 30 tablet 0   Sodium Sulfate-Mag Sulfate-KCl (SUTAB) [phone] MG TABS Take 12 tablets by mouth as directed. 24 tablet 0   Vitamin D, Cholecalciferol, 25 MCG (1000 UT) TABS Take 1,000 Units by mouth daily.     No current facility-administered medications for this visit.    SURGICAL HISTORY:  Past Surgical History:  Procedure Laterality Date   BRONCHIAL WASHINGS  01/18/2021   Procedure: BRONCHIAL WASHINGS;  Surgeon: Julian Hy, DO;  Location: [facility];  Service: Endoscopy;;   ENDOBRONCHIAL ULTRASOUND N/A 01/18/2021   Procedure: ENDOBRONCHIAL ULTRASOUND;  Surgeon: Julian Hy, DO;  Location: [facility];  Service: Endoscopy;  Laterality: N/A;   FINE NEEDLE ASPIRATION  01/18/2021   Procedure: FINE NEEDLE ASPIRATION (  FNA) LINEAR;  Surgeon: Julian Hy, DO;  Location: [facility];  Service: Endoscopy;;   VIDEO BRONCHOSCOPY N/A 01/18/2021   Procedure: VIDEO BRONCHOSCOPY WITHOUT FLUORO;  Surgeon: Julian Hy, DO;  Location: [facility];  Service: Endoscopy;  Laterality: N/A;    REVIEW OF SYSTEMS:  Constitutional: positive for fatigue Eyes: negative Ears, nose, mouth, throat, and face: negative Respiratory: negative Cardiovascular: negative Gastrointestinal: negative Genitourinary:negative Integument/breast:  negative Hematologic/lymphatic: negative Musculoskeletal:negative Neurological: negative Behavioral/Psych: negative Endocrine: negative Allergic/Immunologic: negative   PHYSICAL EXAMINATION: General appearance: alert, cooperative, fatigued, and no distress Head: Normocephalic, without obvious abnormality, atraumatic Neck: no adenopathy, no JVD, supple, symmetrical, trachea midline, and thyroid not enlarged, symmetric, no tenderness/mass/nodules Lymph nodes: Cervical, supraclavicular, and axillary nodes normal. Resp: clear to auscultation bilaterally Back: symmetric, no curvature. ROM normal. No CVA tenderness. Cardio: regular rate and rhythm, S1, S2 normal, no murmur, click, rub or gallop GI: soft, non-tender; bowel sounds normal; no masses,  no organomegaly Extremities: extremities normal, atraumatic, no cyanosis or edema Neurologic: Alert and oriented X 3, normal strength and tone. Normal symmetric reflexes. Normal coordination and gait  ECOG PERFORMANCE STATUS: 1 - Symptomatic but completely ambulatory  Blood pressure 111/77, pulse 90, temperature (!) 97.2 F (36.2 C), temperature source Tympanic, resp. rate 17, weight 122 lb 2 oz (55.4 kg), SpO2 100 %.  LABORATORY DATA: Lab Results  Component Value Date   WBC 1.6 (L) 02/20/2021   HGB 10.8 (L) 02/20/2021   HCT 31.8 (L) 02/20/2021   MCV 94.9 02/20/2021   PLT 348 02/20/2021      Chemistry      Component Value Date/Time   NA 139 02/20/2021 0741   K 4.2 02/20/2021 0741   CL 104 02/20/2021 0741   CO2 29 02/20/2021 0741   BUN 13 02/20/2021 0741   CREATININE 0.65 02/20/2021 0741      Component Value Date/Time   CALCIUM 8.5 (L) 02/20/2021 0741   ALKPHOS 66 02/20/2021 0741   AST 22 02/20/2021 0741   ALT 12 02/20/2021 0741   BILITOT 0.3 02/20/2021 0741       RADIOGRAPHIC STUDIES: MR BRAIN W WO CONTRAST  Result Date: 02/03/2021 CLINICAL DATA:  Small cell lung cancer, staging EXAM: MRI HEAD WITHOUT AND WITH CONTRAST  TECHNIQUE: Multiplanar, multiecho pulse sequences of the brain and surrounding structures were obtained without and with intravenous contrast. CONTRAST:  6.54mL GADAVIST GADOBUTROL 1 MMOL/ML IV SOLN COMPARISON:  No prior MRI of the brain, correlation is made with CT head 01/17/2021 FINDINGS: Brain: 3 mm enhancing lesion in the cingulate gyrus (series 16, image 110), with mild associated edema. 4 mm lesion, in the left medial parietal lobe (series 16, image 101). 4 mm enhancing lesion in the lateral left parietal cortex (series 16, image 88). Possible 4 mm enhancing lesion in the lateral left temporal lobe (series 16, image 85). Possible punctate lesion in right anterior frontal lobe (series 16, image 109). No acute infarct, hemorrhage, mass effect, or midline shift. No hydrocephalus. No extra-axial collection. The basal cisterns are preserved. T2 hyperintense signal in the periventricular white matter, likely the sequela of chronic small vessel ischemic disease. Vascular: Normal flow voids. Skull and upper cervical spine: Normal marrow signal. Sinuses/Orbits: Negative. Other: The mastoids are well aerated. IMPRESSION: Multiple 3 and 4 mm enhancing lesions in the cerebral hemispheres, concerning for metastatic disease. Electronically Signed   By: Merilyn Baba M.D.   On: 02/03/2021 18:24   NM PET Image Initial (PI) Skull Base To Thigh  Result Date: 01/30/2021 CLINICAL DATA:  Initial treatment strategy for small-cell lung cancer. EXAM: NUCLEAR MEDICINE PET SKULL BASE TO THIGH TECHNIQUE: 5.7 mCi F-18 FDG was injected intravenously. Full-ring PET imaging was performed from the skull base to thigh after the radiotracer. CT data was obtained and used for attenuation correction and anatomic localization. Fasting blood glucose: 101 mg/dl COMPARISON:  CTA Chest 01/16/2021 FINDINGS: Mediastinal blood pool activity: SUV max 2.1 Liver activity: SUV max NA NECK: No hypermetabolic lymph nodes in the neck. Incidental CT  findings: None. CHEST: The patient's known bulky central right lung mass is markedly hypermetabolic with SUV max = 29.5. 16 mm short axis right paratracheal node on 18/8 is hypermetabolic with SUV max = 6.2. Hypermetabolic node posterior to the carina demonstrates SUV max = 8.7. No hypermetabolism in the left hilum or axillary regions. Low level FDG uptake identified in the collapse consolidation seen in the right lower lobe. Incidental CT findings: Volume loss right hemithorax with right lower lobe collapse/consolidation. There is diffuse interstitial and nodular disease visible within the aerated portions of the right mid and lower lung consistent with metastatic involvement. ABDOMEN/PELVIS: No abnormal hypermetabolic activity within the liver, pancreas, adrenal glands, or spleen. No hypermetabolic lymph nodes in the abdomen or pelvis. Incidental CT findings: Tiny hypodensities in the liver parenchyma are too small to characterize. These show no hypermetabolism on PET imaging and are most likely benign, and probably cysts. There is mild atherosclerotic calcification of the abdominal aorta without aneurysm. Calcified fibroids noted in the uterus. SKELETON: No focal hypermetabolic activity to suggest skeletal metastasis. Incidental CT findings: No worrisome lytic or sclerotic osseous abnormality. IMPRESSION: 1. The central right lung mass is markedly hypermetabolic, consistent with the patient's known primary neoplasm. 2. Low level FDG accumulation is identified in the interstitial and nodular disease involving the right lower lung, suspicious for metastatic disease/lymphangitic tumor spread. 3. Hypermetabolic central mediastinal lymphadenopathy consistent with metastatic disease. No evidence for hypermetabolism in the left hilum. 4. No hypermetabolic metastatic disease in the neck, abdomen, or pelvis. 5.  Aortic Atherosclerois (ICD10-170.0) Electronically Signed   By: Misty Stanley M.D.   On: 01/30/2021 08:57     ASSESSMENT AND PLAN: This is a very pleasant 70 years old white female recently diagnosed with extensive stage (T2 a, N2, M1b) small cell lung cancer presented with bulky right hilar mass and mediastinal lymphadenopathy with occlusion of the distal right mainstem bronchus and right lower lobe bronchus as well as new subcentimeter brain metastasis diagnosed in September 2022. The patient started systemic chemotherapy with cisplatin and etoposide last week and tolerated the first week of her treatment fairly well except for increasing fatigue and weakness as well as diarrhea, lack of appetite and weight loss. Unfortunately the staging work-up showed multiple subcentimeter brain metastasis.  The PET scan showed no evidence of metastatic disease outside the previously known areas in the chest. She is here today for evaluation before starting the first cycle of palliative systemic chemotherapy but changing the regimen to carboplatin for AUC of 5 on day 1 and etoposide 100 Mg/M2 on days 1, 2 and 3 with Cosela 240 Mg/M2 before chemotherapy as well as Imfinzi 1500 Mg IV every 3 weeks on day one of the chemotherapy. CBC today showed significant neutropenia and the patient will not be able to start the first cycle of her treatment today as planned. I will arrange for the patient to start her first cycle of this chemotherapy next week on February 27, 2021. For  the chemotherapy-induced neutropenia, will start the patient on G-CSF injection 300 mcg subcutaneously for 3 days. For the lack of appetite, the patient is currently on Megace ES 625 mg p.o. daily. For the insomnia, I will start the patient on Restoril 15 mg p.o. nightly The patient will come back for follow-up visit in 4 weeks for evaluation before starting cycle #2 of her treatment. The patient was advised to call immediately if she has any concerning symptoms in the interval. The patient voices understanding of current disease status and treatment  options and is in agreement with the current care plan.  All questions were answered. The patient knows to call the clinic with any problems, questions or concerns. We can certainly see the patient much sooner if necessary.  The total time spent in the appointment was 35 minutes.  Disclaimer: This note was dictated with voice recognition software. Similar sounding words can inadvertently be transcribed and may not be corrected upon review.

## 2021-02-21 ENCOUNTER — Encounter: Payer: Self-pay | Admitting: Internal Medicine

## 2021-02-21 ENCOUNTER — Inpatient Hospital Stay: Payer: Medicare HMO

## 2021-02-21 ENCOUNTER — Other Ambulatory Visit: Payer: Self-pay

## 2021-02-21 ENCOUNTER — Ambulatory Visit
Admission: RE | Admit: 2021-02-21 | Discharge: 2021-02-21 | Disposition: A | Discharge status: Home / Self Care | Payer: Medicare HMO | Source: Ambulatory Visit | Attending: Radiation Oncology | Admitting: Radiation Oncology

## 2021-02-21 DIAGNOSIS — Z510 Encounter for antineoplastic radiation therapy: Secondary | ICD-10-CM | POA: Diagnosis not present

## 2021-02-21 DIAGNOSIS — C3491 Malignant neoplasm of unspecified part of right bronchus or lung: Secondary | ICD-10-CM | POA: Diagnosis not present

## 2021-02-21 DIAGNOSIS — C3431 Malignant neoplasm of lower lobe, right bronchus or lung: Secondary | ICD-10-CM | POA: Diagnosis not present

## 2021-02-21 MED ORDER — FILGRASTIM-SNDZ 300 MCG/0.5ML IJ SOSY: 300.0000 ug | PREFILLED_SYRINGE | Freq: Once | INTRAMUSCULAR | Status: DC

## 2021-02-22 ENCOUNTER — Ambulatory Visit: Payer: Medicare HMO

## 2021-02-22 ENCOUNTER — Inpatient Hospital Stay: Payer: Medicare HMO

## 2021-02-22 ENCOUNTER — Ambulatory Visit
Admission: RE | Admit: 2021-02-22 | Discharge: 2021-02-22 | Disposition: A | Discharge status: Home / Self Care | Payer: Medicare HMO | Source: Ambulatory Visit | Attending: Radiation Oncology | Admitting: Radiation Oncology

## 2021-02-22 ENCOUNTER — Telehealth: Payer: Self-pay | Admitting: Internal Medicine

## 2021-02-22 VITALS — BP 123/86 | HR 96 | Resp 18

## 2021-02-22 DIAGNOSIS — Z79899 Other long term (current) drug therapy: Secondary | ICD-10-CM | POA: Diagnosis not present

## 2021-02-22 DIAGNOSIS — T451X5A Adverse effect of antineoplastic and immunosuppressive drugs, initial encounter: Secondary | ICD-10-CM

## 2021-02-22 DIAGNOSIS — D701 Agranulocytosis secondary to cancer chemotherapy: Secondary | ICD-10-CM

## 2021-02-22 DIAGNOSIS — C7931 Secondary malignant neoplasm of brain: Secondary | ICD-10-CM | POA: Diagnosis not present

## 2021-02-22 DIAGNOSIS — C3401 Malignant neoplasm of right main bronchus: Secondary | ICD-10-CM | POA: Diagnosis not present

## 2021-02-22 DIAGNOSIS — Z510 Encounter for antineoplastic radiation therapy: Secondary | ICD-10-CM | POA: Diagnosis not present

## 2021-02-22 DIAGNOSIS — C3431 Malignant neoplasm of lower lobe, right bronchus or lung: Secondary | ICD-10-CM | POA: Diagnosis not present

## 2021-02-22 DIAGNOSIS — Z5111 Encounter for antineoplastic chemotherapy: Secondary | ICD-10-CM | POA: Diagnosis not present

## 2021-02-22 DIAGNOSIS — C3491 Malignant neoplasm of unspecified part of right bronchus or lung: Secondary | ICD-10-CM | POA: Diagnosis not present

## 2021-02-22 MED ORDER — FILGRASTIM-SNDZ 300 MCG/0.5ML IJ SOSY
300.0000 ug | PREFILLED_SYRINGE | Freq: Once | INTRAMUSCULAR | Status: AC
  Administered 2021-02-22: 300 ug via SUBCUTANEOUS
  Filled 2021-02-22: qty 0.5

## 2021-02-22 NOTE — Patient Instructions (Signed)
Filgrastim, G-CSF injection What is this medication? FILGRASTIM, G-CSF (fil GRA stim) is a granulocyte colony-stimulating factor that stimulates the growth of neutrophils, a type of white blood cell (WBC) important in the body's fight against infection. It is used to reduce the incidence of fever and infection in patients with certain types of cancer who are receiving chemotherapy that affects the bone marrow, to stimulate blood cell production for removal of WBCs from the body prior to a bone marrow transplantation, to reduce the incidence of fever and infection in patients who have severe chronic neutropenia, and to improve survival outcomes following high-dose radiation exposure that is toxic to the bone marrow. This medicine may be used for other purposes; ask your health care provider or pharmacist if you have questions. COMMON BRAND NAME(S): Neupogen, Nivestym, Releuko, Zarxio What should I tell my care team before I take this medication? They need to know if you have any of these conditions: kidney disease latex allergy ongoing radiation therapy sickle cell disease an unusual or allergic reaction to filgrastim, pegfilgrastim, other medicines, foods, dyes, or preservatives pregnant or trying to get pregnant breast-feeding How should I use this medication? This medicine is for injection under the skin or infusion into a vein. As an infusion into a vein, it is usually given by a health care professional in a hospital or clinic setting. If you get this medicine at home, you will be taught how to prepare and give this medicine. Refer to the Instructions for Use that come with your medication packaging. Use exactly as directed. Take your medicine at regular intervals. Do not take your medicine more often than directed. It is important that you put your used needles and syringes in a special sharps container. Do not put them in a trash can. If you do not have a sharps container, call your pharmacist  or healthcare provider to get one. Talk to your pediatrician regarding the use of this medicine in children. While this drug may be prescribed for children as young as 7 months for selected conditions, precautions do apply. Overdosage: If you think you have taken too much of this medicine contact a poison control center or emergency room at once. NOTE: This medicine is only for you. Do not share this medicine with others. What if I miss a dose? It is important not to miss your dose. Call your doctor or health care professional if you miss a dose. What may interact with this medication? This medicine may interact with the following medications: medicines that may cause a release of neutrophils, such as lithium This list may not describe all possible interactions. Give your health care provider a list of all the medicines, herbs, non-prescription drugs, or dietary supplements you use. Also tell them if you smoke, drink alcohol, or use illegal drugs. Some items may interact with your medicine. What should I watch for while using this medication? Your condition will be monitored carefully while you are receiving this medicine. You may need blood work done while you are taking this medicine. Talk to your health care provider about your risk of cancer. You may be more at risk for certain types of cancer if you take this medicine. What side effects may I notice from receiving this medication? Side effects that you should report to your doctor or health care professional as soon as possible: allergic reactions like skin rash, itching or hives, swelling of the face, lips, or tongue back pain dizziness or feeling faint fever pain, redness, or   irritation at site where injected pinpoint red spots on the skin shortness of breath or breathing problems signs and symptoms of kidney injury like trouble passing urine, change in the amount of urine, or red or dark-brown urine stomach or side pain, or pain at  the shoulder swelling tiredness unusual bleeding or bruising Side effects that usually do not require medical attention (report to your doctor or health care professional if they continue or are bothersome): bone pain cough diarrhea hair loss headache muscle pain This list may not describe all possible side effects. Call your doctor for medical advice about side effects. You may report side effects to FDA at 1-800-FDA-1088. Where should I keep my medication? Keep out of the reach of children. Store in a refrigerator between 2 and 8 degrees C (36 and 46 degrees F). Do not freeze. Keep in carton to protect from light. Throw away this medicine if vials or syringes are left out of the refrigerator for more than 24 hours. Throw away any unused medicine after the expiration date. NOTE: This sheet is a summary. It may not cover all possible information. If you have questions about this medicine, talk to your doctor, pharmacist, or health care provider.  2022 Elsevier/Gold Standard (2019-04-29 18:47:55)  

## 2021-02-22 NOTE — Telephone Encounter (Signed)
Scheduled follow-up appointments per 11/1 los. Patient's husband is aware.

## 2021-02-23 ENCOUNTER — Other Ambulatory Visit: Payer: Self-pay

## 2021-02-23 ENCOUNTER — Inpatient Hospital Stay: Payer: Medicare HMO

## 2021-02-23 ENCOUNTER — Ambulatory Visit: Payer: Medicare HMO

## 2021-02-23 ENCOUNTER — Ambulatory Visit
Admission: RE | Admit: 2021-02-23 | Discharge: 2021-02-23 | Disposition: A | Discharge status: Home / Self Care | Payer: Medicare HMO | Source: Ambulatory Visit | Attending: Radiation Oncology | Admitting: Radiation Oncology

## 2021-02-23 VITALS — BP 114/72 | HR 93 | Temp 98.0°F | Resp 17

## 2021-02-23 DIAGNOSIS — Z79899 Other long term (current) drug therapy: Secondary | ICD-10-CM | POA: Diagnosis not present

## 2021-02-23 DIAGNOSIS — Z5111 Encounter for antineoplastic chemotherapy: Secondary | ICD-10-CM | POA: Diagnosis not present

## 2021-02-23 DIAGNOSIS — Z510 Encounter for antineoplastic radiation therapy: Secondary | ICD-10-CM | POA: Diagnosis not present

## 2021-02-23 DIAGNOSIS — D701 Agranulocytosis secondary to cancer chemotherapy: Secondary | ICD-10-CM

## 2021-02-23 DIAGNOSIS — C3401 Malignant neoplasm of right main bronchus: Secondary | ICD-10-CM | POA: Diagnosis not present

## 2021-02-23 DIAGNOSIS — C3431 Malignant neoplasm of lower lobe, right bronchus or lung: Secondary | ICD-10-CM | POA: Diagnosis not present

## 2021-02-23 DIAGNOSIS — C3491 Malignant neoplasm of unspecified part of right bronchus or lung: Secondary | ICD-10-CM | POA: Diagnosis not present

## 2021-02-23 DIAGNOSIS — C7931 Secondary malignant neoplasm of brain: Secondary | ICD-10-CM | POA: Diagnosis not present

## 2021-02-23 MED ORDER — FILGRASTIM-SNDZ 300 MCG/0.5ML IJ SOSY
300.0000 ug | PREFILLED_SYRINGE | Freq: Once | INTRAMUSCULAR | Status: AC
  Administered 2021-02-23: 300 ug via SUBCUTANEOUS
  Filled 2021-02-23: qty 0.5

## 2021-02-24 ENCOUNTER — Inpatient Hospital Stay: Payer: Medicare HMO

## 2021-02-24 ENCOUNTER — Other Ambulatory Visit: Payer: Self-pay

## 2021-02-24 VITALS — BP 125/91 | HR 98 | Temp 97.9°F | Resp 18

## 2021-02-24 DIAGNOSIS — T451X5A Adverse effect of antineoplastic and immunosuppressive drugs, initial encounter: Secondary | ICD-10-CM

## 2021-02-24 DIAGNOSIS — C3401 Malignant neoplasm of right main bronchus: Secondary | ICD-10-CM | POA: Diagnosis not present

## 2021-02-24 DIAGNOSIS — D701 Agranulocytosis secondary to cancer chemotherapy: Secondary | ICD-10-CM

## 2021-02-24 DIAGNOSIS — C7931 Secondary malignant neoplasm of brain: Secondary | ICD-10-CM | POA: Diagnosis not present

## 2021-02-24 DIAGNOSIS — Z79899 Other long term (current) drug therapy: Secondary | ICD-10-CM | POA: Diagnosis not present

## 2021-02-24 DIAGNOSIS — Z5111 Encounter for antineoplastic chemotherapy: Secondary | ICD-10-CM | POA: Diagnosis not present

## 2021-02-24 MED ORDER — FILGRASTIM-SNDZ 300 MCG/0.5ML IJ SOSY
300.0000 ug | PREFILLED_SYRINGE | Freq: Once | INTRAMUSCULAR | Status: AC
Start: 2021-02-24 — End: 2021-02-24
  Administered 2021-02-24: 300 ug via SUBCUTANEOUS

## 2021-02-26 ENCOUNTER — Ambulatory Visit
Admission: RE | Admit: 2021-02-26 | Discharge: 2021-02-26 | Disposition: A | Discharge status: Home / Self Care | Payer: Medicare HMO | Source: Ambulatory Visit | Attending: Radiation Oncology | Admitting: Radiation Oncology

## 2021-02-26 ENCOUNTER — Other Ambulatory Visit: Payer: Self-pay

## 2021-02-26 ENCOUNTER — Encounter: Payer: Self-pay | Admitting: Radiation Oncology

## 2021-02-26 DIAGNOSIS — Z510 Encounter for antineoplastic radiation therapy: Secondary | ICD-10-CM | POA: Diagnosis not present

## 2021-02-26 DIAGNOSIS — C3431 Malignant neoplasm of lower lobe, right bronchus or lung: Secondary | ICD-10-CM | POA: Diagnosis not present

## 2021-02-26 DIAGNOSIS — C3491 Malignant neoplasm of unspecified part of right bronchus or lung: Secondary | ICD-10-CM | POA: Diagnosis not present

## 2021-02-26 MED FILL — Fosaprepitant Dimeglumine For IV Infusion 150 MG (Base Eq): INTRAVENOUS | Qty: 5 | Status: AC

## 2021-02-26 MED FILL — Dexamethasone Sodium Phosphate Inj 100 MG/10ML: INTRAMUSCULAR | Qty: 1 | Status: AC

## 2021-02-27 ENCOUNTER — Telehealth (HOSPITAL_BASED_OUTPATIENT_CLINIC_OR_DEPARTMENT_OTHER): Payer: Medicare HMO | Admitting: Physician Assistant

## 2021-02-27 ENCOUNTER — Other Ambulatory Visit: Payer: Medicare HMO

## 2021-02-27 ENCOUNTER — Inpatient Hospital Stay: Payer: Medicare HMO

## 2021-02-27 ENCOUNTER — Other Ambulatory Visit: Payer: Self-pay | Admitting: Physician Assistant

## 2021-02-27 ENCOUNTER — Ambulatory Visit: Payer: Medicare HMO

## 2021-02-27 VITALS — BP 147/92 | HR 90 | Temp 97.7°F | Resp 20 | Ht 63.0 in | Wt 121.0 lb

## 2021-02-27 DIAGNOSIS — C7931 Secondary malignant neoplasm of brain: Secondary | ICD-10-CM | POA: Diagnosis not present

## 2021-02-27 DIAGNOSIS — C3401 Malignant neoplasm of right main bronchus: Secondary | ICD-10-CM | POA: Diagnosis not present

## 2021-02-27 DIAGNOSIS — C3491 Malignant neoplasm of unspecified part of right bronchus or lung: Secondary | ICD-10-CM

## 2021-02-27 DIAGNOSIS — Z5111 Encounter for antineoplastic chemotherapy: Secondary | ICD-10-CM | POA: Diagnosis not present

## 2021-02-27 DIAGNOSIS — Z79899 Other long term (current) drug therapy: Secondary | ICD-10-CM | POA: Diagnosis not present

## 2021-02-27 LAB — CBC WITH DIFFERENTIAL (CANCER CENTER ONLY)
Abs Immature Granulocytes: 0.53 10*3/uL — ABNORMAL HIGH (ref 0.00–0.07)
Basophils Absolute: 0.1 10*3/uL (ref 0.0–0.1)
Basophils Relative: 2 %
Eosinophils Absolute: 0 10*3/uL (ref 0.0–0.5)
Eosinophils Relative: 1 %
HCT: 33 % — ABNORMAL LOW (ref 36.0–46.0)
Hemoglobin: 11 g/dL — ABNORMAL LOW (ref 12.0–15.0)
Immature Granulocytes: 13 %
Lymphocytes Relative: 22 %
Lymphs Abs: 0.9 10*3/uL (ref 0.7–4.0)
MCH: 31.5 pg (ref 26.0–34.0)
MCHC: 33.3 g/dL (ref 30.0–36.0)
MCV: 94.6 fL (ref 80.0–100.0)
Monocytes Absolute: 0.7 10*3/uL (ref 0.1–1.0)
Monocytes Relative: 18 %
Neutro Abs: 1.8 10*3/uL (ref 1.7–7.7)
Neutrophils Relative %: 44 %
Platelet Count: 285 10*3/uL (ref 150–400)
RBC: 3.49 MIL/uL — ABNORMAL LOW (ref 3.87–5.11)
RDW: 14.6 % (ref 11.5–15.5)
WBC Count: 4 10*3/uL (ref 4.0–10.5)
nRBC: 0 % (ref 0.0–0.2)

## 2021-02-27 LAB — CMP (CANCER CENTER ONLY)
ALT: 9 U/L (ref 0–44)
AST: 21 U/L (ref 15–41)
Albumin: 3.5 g/dL (ref 3.5–5.0)
Alkaline Phosphatase: 92 U/L (ref 38–126)
Anion gap: 10 (ref 5–15)
BUN: 18 mg/dL (ref 8–23)
CO2: 25 mmol/L (ref 22–32)
Calcium: 8.7 mg/dL — ABNORMAL LOW (ref 8.9–10.3)
Chloride: 107 mmol/L (ref 98–111)
Creatinine: 0.7 mg/dL (ref 0.44–1.00)
GFR, Estimated: >60 mL/min (ref >60)
Glucose, Bld: 95 mg/dL (ref 70–99)
Potassium: 3.6 mmol/L (ref 3.5–5.1)
Sodium: 142 mmol/L (ref 135–145)
Total Bilirubin: <0.2 mg/dL — ABNORMAL LOW (ref 0.3–1.2)
Total Protein: 6.1 g/dL — ABNORMAL LOW (ref 6.5–8.1)

## 2021-02-27 LAB — MAGNESIUM: Magnesium: 2.1 mg/dL (ref 1.7–2.4)

## 2021-02-27 LAB — TSH: TSH: 0.342 u[IU]/mL (ref 0.308–3.960)

## 2021-02-27 MED ORDER — SODIUM CHLORIDE 0.9 % IV SOLN
351.0000 mg | Freq: Once | INTRAVENOUS | Status: AC
  Administered 2021-02-27: 350 mg via INTRAVENOUS
  Filled 2021-02-27: qty 35

## 2021-02-27 MED ORDER — SODIUM CHLORIDE 0.9 % IV SOLN
100.0000 mg/m2 | Freq: Once | INTRAVENOUS | Status: AC
  Administered 2021-02-27: 150 mg via INTRAVENOUS
  Filled 2021-02-27: qty 7.5

## 2021-02-27 MED ORDER — TRILACICLIB DIHYDROCHLORIDE INJECTION 300 MG: 240.0000 mg/m2 | Freq: Once | INTRAVENOUS | Status: DC

## 2021-02-27 MED ORDER — SODIUM CHLORIDE 0.9 % IV SOLN
1500.0000 mg | Freq: Once | INTRAVENOUS | Status: AC
  Administered 2021-02-27: 1500 mg via INTRAVENOUS
  Filled 2021-02-27: qty 30

## 2021-02-27 MED ORDER — PALONOSETRON HCL INJECTION 0.25 MG/5ML
0.2500 mg | Freq: Once | INTRAVENOUS | Status: AC
  Administered 2021-02-27: 0.25 mg via INTRAVENOUS
  Filled 2021-02-27: qty 5

## 2021-02-27 MED ORDER — SODIUM CHLORIDE 0.9 % IV SOLN
10.0000 mg | Freq: Once | INTRAVENOUS | Status: AC
  Administered 2021-02-27: 10 mg via INTRAVENOUS
  Filled 2021-02-27: qty 10
  Filled 2021-02-27: qty 1

## 2021-02-27 MED ORDER — SODIUM CHLORIDE 0.9 % IV SOLN: Freq: Once | INTRAVENOUS | Status: AC

## 2021-02-27 MED ORDER — SODIUM CHLORIDE 0.9 % IV SOLN
150.0000 mg | Freq: Once | INTRAVENOUS | Status: AC
  Administered 2021-02-27: 150 mg via INTRAVENOUS
  Filled 2021-02-27: qty 5
  Filled 2021-02-27: qty 150
  Filled 2021-02-27: qty 5

## 2021-02-27 MED FILL — Dexamethasone Sodium Phosphate Inj 100 MG/10ML: INTRAMUSCULAR | Qty: 1 | Status: AC

## 2021-02-27 NOTE — Patient Instructions (Signed)
[facility]  Discharge Instructions: Thank you for choosing [facility] to provide your oncology and hematology care.   If you have a lab appointment with the [facility], please go directly to the [facility] and check in at the registration area.   Wear comfortable clothing and clothing appropriate for easy access to any Portacath or PICC line.   We strive to give you quality time with your provider. You may need to reschedule your appointment if you arrive late (15 or more minutes).  Arriving late affects you and other patients whose appointments are after yours.  Also, if you miss three or more appointments without notifying the office, you may be dismissed from the clinic at the provider's discretion.      For prescription refill requests, have your pharmacy contact our office and allow 72 hours for refills to be completed.    Today you received the following chemotherapy and/or immunotherapy agents: Durvalumab (Imfinzi), Carboplatin, and Etoposide.   To help prevent nausea and vomiting after your treatment, we encourage you to take your nausea medication as directed.  BELOW ARE SYMPTOMS THAT SHOULD BE REPORTED IMMEDIATELY: *FEVER GREATER THAN 100.4 F (38 C) OR HIGHER *CHILLS OR SWEATING *NAUSEA AND VOMITING THAT IS NOT CONTROLLED WITH YOUR NAUSEA MEDICATION *UNUSUAL SHORTNESS OF BREATH *UNUSUAL BRUISING OR BLEEDING *URINARY PROBLEMS (pain or burning when urinating, or frequent urination) *BOWEL PROBLEMS (unusual diarrhea, constipation, pain near the anus) TENDERNESS IN MOUTH AND THROAT WITH OR WITHOUT PRESENCE OF ULCERS (sore throat, sores in mouth, or a toothache) UNUSUAL RASH, SWELLING OR PAIN  UNUSUAL VAGINAL DISCHARGE OR ITCHING   Items with * indicate a potential emergency and should be followed up as soon as possible or go to the Emergency Department if any problems should occur.  Please show the CHEMOTHERAPY ALERT CARD or  IMMUNOTHERAPY ALERT CARD at check-in to the Emergency Department and triage nurse.  Should you have questions after your visit or need to cancel or reschedule your appointment, please contact [facility]  Dept: [phone]  and follow the prompts.  Office hours are 8:00 a.m. to 4:30 p.m. Monday - Friday. Please note that voicemails left after 4:00 p.m. may not be returned until the following business day.  We are closed weekends and major holidays. You have access to a nurse at all times for urgent questions. Please call the main number to the clinic Dept: [phone] and follow the prompts.   For any non-urgent questions, you may also contact your provider using MyChart. We now offer e-Visits for anyone 1 and older to request care online for non-urgent symptoms. For details visit [email or web address].   Also download the MyChart app! Go to the app store, search "MyChart", open the app, select , and log in with your MyChart username and password.  Due to Covid, a mask is required upon entering the hospital/clinic. If you do not have a mask, one will be given to you upon arrival. For doctor visits, patients may have 1 support person aged 84 or older with them. For treatment visits, patients cannot have anyone with them due to current Covid guidelines and our immunocompromised population.   Durvalumab injection What is this medication? DURVALUMAB (dur VAL ue mab) is a monoclonal antibody. It is used to treat lung cancer. This medicine may be used for other purposes; ask your health care provider or pharmacist if you have questions. COMMON BRAND NAME(S): IMFINZI What should I  tell my care team before I take this medication? They need to know if you have any of these conditions: autoimmune diseases like Crohn's disease, ulcerative colitis, or lupus have had or planning to have an allogeneic stem cell transplant (uses someone else's stem cells) history of  organ transplant history of radiation to the chest nervous system problems like myasthenia gravis or Guillain-Barre syndrome an unusual or allergic reaction to durvalumab, other medicines, foods, dyes, or preservatives pregnant or trying to get pregnant breast-feeding How should I use this medication? This medicine is for infusion into a vein. It is given by a health care professional in a hospital or clinic setting. A special MedGuide will be given to you before each treatment. Be sure to read this information carefully each time. Talk to your pediatrician regarding the use of this medicine in children. Special care may be needed. Overdosage: If you think you have taken too much of this medicine contact a poison control center or emergency room at once. NOTE: This medicine is only for you. Do not share this medicine with others. What if I miss a dose? It is important not to miss your dose. Call your doctor or health care professional if you are unable to keep an appointment. What may interact with this medication? Interactions have not been studied. This list may not describe all possible interactions. Give your health care provider a list of all the medicines, herbs, non-prescription drugs, or dietary supplements you use. Also tell them if you smoke, drink alcohol, or use illegal drugs. Some items may interact with your medicine. What should I watch for while using this medication? This medication may make you feel generally unwell. Continue your course of treatment even though you feel ill unless your care team tells you to stop. You may need blood work done while you are taking this medication. Do not become pregnant while taking this medication or for 3 months after stopping it. Women should inform their care team if they wish to become pregnant or think they might be pregnant. There is a potential for serious side effects to an unborn child. Talk to your care team or pharmacist for more  information. Do not breast-feed an infant while taking this medication or for 3 months after stopping it. What side effects may I notice from receiving this medication? Side effects that you should report to your care team as soon as possible: Allergic reactions--skin rash, itching, hives, swelling of the face, lips, tongue, or throat Bloody or watery diarrhea Dizziness, loss of balance or coordination, confusion or trouble speaking Dry cough, shortness of breath or trouble breathing Flushing, mostly over the face, neck, and chest, during injection High blood sugar (hyperglycemia)--increased thirst or amount of urine, unusual weakness or fatigue, blurry vision High thyroid levels (hyperthyroidism)--fast or irregular heartbeat, weight loss, excessive sweating or sensitivity to heat, tremors or shaking, anxiety, nervousness, irregular menstrual cycle or spotting Infection--fever, chills, cough, or sore throat Liver injury--right upper belly pain, loss of appetite, nausea, light-colored stool, dark yellow or brown urine, yellowing skin or eyes, unusual weakness or fatigue Low adrenal gland function--nausea, vomiting, loss of appetite, unusual weakness or fatigue, dizziness, low blood pressure Low thyroid levels (hypothyroidism)--unusual weakness or fatigue, increased sensitivity to cold, constipation, hair loss, dry skin, weight gain, feelings of depression Pancreatitis--severe stomach pain that spreads to your back or gets worse after eating or when touched, fever, nausea, vomiting Rash, fever, and swollen lymph nodes Redness, blistering, peeling or loosening of the skin,  including inside the mouth Wheezing--trouble breathing with loud or whistling sounds Side effects that usually do not require medical attention (report these to your care team if they continue or are bothersome): Fatigue Hair loss This list may not describe all possible side effects. Call your doctor for medical advice about side  effects. You may report side effects to FDA at 1-800-FDA-1088. Where should I keep my medication? This medication is given in a hospital or clinic. It will not be stored at home. NOTE: This sheet is a summary. It may not cover all possible information. If you have questions about this medicine, talk to your doctor, pharmacist, or health care provider.  2022 Elsevier/Gold Standard (2020-12-26 00:00:00)  Carboplatin injection What is this medication? CARBOPLATIN (KAR boe pla tin) is a chemotherapy drug. It targets fast dividing cells, like cancer cells, and causes these cells to die. This medicine is used to treat ovarian cancer and many other cancers. This medicine may be used for other purposes; ask your health care provider or pharmacist if you have questions. COMMON BRAND NAME(S): Paraplatin What should I tell my care team before I take this medication? They need to know if you have any of these conditions: blood disorders hearing problems kidney disease recent or ongoing radiation therapy an unusual or allergic reaction to carboplatin, cisplatin, other chemotherapy, other medicines, foods, dyes, or preservatives pregnant or trying to get pregnant breast-feeding How should I use this medication? This drug is usually given as an infusion into a vein. It is administered in a hospital or clinic by a specially trained health care professional. Talk to your pediatrician regarding the use of this medicine in children. Special care may be needed. Overdosage: If you think you have taken too much of this medicine contact a poison control center or emergency room at once. NOTE: This medicine is only for you. Do not share this medicine with others. What if I miss a dose? It is important not to miss a dose. Call your doctor or health care professional if you are unable to keep an appointment. What may interact with this medication? medicines for seizures medicines to increase blood counts like  filgrastim, pegfilgrastim, sargramostim some antibiotics like amikacin, gentamicin, neomycin, streptomycin, tobramycin vaccines Talk to your doctor or health care professional before taking any of these medicines: acetaminophen aspirin ibuprofen ketoprofen naproxen This list may not describe all possible interactions. Give your health care provider a list of all the medicines, herbs, non-prescription drugs, or dietary supplements you use. Also tell them if you smoke, drink alcohol, or use illegal drugs. Some items may interact with your medicine. What should I watch for while using this medication? Your condition will be monitored carefully while you are receiving this medicine. You will need important blood work done while you are taking this medicine. This drug may make you feel generally unwell. This is not uncommon, as chemotherapy can affect healthy cells as well as cancer cells. Report any side effects. Continue your course of treatment even though you feel ill unless your doctor tells you to stop. In some cases, you may be given additional medicines to help with side effects. Follow all directions for their use. Call your doctor or health care professional for advice if you get a fever, chills or sore throat, or other symptoms of a cold or flu. Do not treat yourself. This drug decreases your body's ability to fight infections. Try to avoid being around people who are sick. This medicine may increase your  risk to bruise or bleed. Call your doctor or health care professional if you notice any unusual bleeding. Be careful brushing and flossing your teeth or using a toothpick because you may get an infection or bleed more easily. If you have any dental work done, tell your dentist you are receiving this medicine. Avoid taking products that contain aspirin, acetaminophen, ibuprofen, naproxen, or ketoprofen unless instructed by your doctor. These medicines may hide a fever. Do not become pregnant  while taking this medicine. Women should inform their doctor if they wish to become pregnant or think they might be pregnant. There is a potential for serious side effects to an unborn child. Talk to your health care professional or pharmacist for more information. Do not breast-feed an infant while taking this medicine. What side effects may I notice from receiving this medication? Side effects that you should report to your doctor or health care professional as soon as possible: allergic reactions like skin rash, itching or hives, swelling of the face, lips, or tongue signs of infection - fever or chills, cough, sore throat, pain or difficulty passing urine signs of decreased platelets or bleeding - bruising, pinpoint red spots on the skin, black, tarry stools, nosebleeds signs of decreased red blood cells - unusually weak or tired, fainting spells, lightheadedness breathing problems changes in hearing changes in vision chest pain high blood pressure low blood counts - This drug may decrease the number of white blood cells, red blood cells and platelets. You may be at increased risk for infections and bleeding. nausea and vomiting pain, swelling, redness or irritation at the injection site pain, tingling, numbness in the hands or feet problems with balance, talking, walking trouble passing urine or change in the amount of urine Side effects that usually do not require medical attention (report to your doctor or health care professional if they continue or are bothersome): hair loss loss of appetite metallic taste in the mouth or changes in taste This list may not describe all possible side effects. Call your doctor for medical advice about side effects. You may report side effects to FDA at 1-800-FDA-1088. Where should I keep my medication? This drug is given in a hospital or clinic and will not be stored at home. NOTE: This sheet is a summary. It may not cover all possible information. If  you have questions about this medicine, talk to your doctor, pharmacist, or health care provider.  2022 Elsevier/Gold Standard (2007-09-16 00:00:00)

## 2021-02-27 NOTE — Telephone Encounter (Signed)
error 

## 2021-02-28 ENCOUNTER — Encounter: Payer: Self-pay | Admitting: Internal Medicine

## 2021-02-28 ENCOUNTER — Inpatient Hospital Stay: Payer: Medicare HMO

## 2021-02-28 ENCOUNTER — Ambulatory Visit: Payer: Medicare HMO

## 2021-02-28 ENCOUNTER — Other Ambulatory Visit: Payer: Self-pay

## 2021-02-28 VITALS — BP 110/57 | HR 95 | Temp 97.8°F | Resp 18

## 2021-02-28 DIAGNOSIS — Z79899 Other long term (current) drug therapy: Secondary | ICD-10-CM | POA: Diagnosis not present

## 2021-02-28 DIAGNOSIS — C3401 Malignant neoplasm of right main bronchus: Secondary | ICD-10-CM | POA: Diagnosis not present

## 2021-02-28 DIAGNOSIS — Z5111 Encounter for antineoplastic chemotherapy: Secondary | ICD-10-CM | POA: Diagnosis not present

## 2021-02-28 DIAGNOSIS — C7931 Secondary malignant neoplasm of brain: Secondary | ICD-10-CM | POA: Diagnosis not present

## 2021-02-28 DIAGNOSIS — C3491 Malignant neoplasm of unspecified part of right bronchus or lung: Secondary | ICD-10-CM

## 2021-02-28 MED ORDER — SODIUM CHLORIDE 0.9 % IV SOLN
10.0000 mg | Freq: Once | INTRAVENOUS | Status: AC
  Administered 2021-02-28: 10 mg via INTRAVENOUS
  Filled 2021-02-28: qty 10
  Filled 2021-02-28: qty 1

## 2021-02-28 MED ORDER — SODIUM CHLORIDE 0.9 % IV SOLN: Freq: Once | INTRAVENOUS | Status: AC

## 2021-02-28 MED ORDER — TRILACICLIB DIHYDROCHLORIDE INJECTION 300 MG
240.0000 mg/m2 | Freq: Once | INTRAVENOUS | Status: AC
  Administered 2021-02-28: 360 mg via INTRAVENOUS
  Filled 2021-02-28: qty 24

## 2021-02-28 MED ORDER — SODIUM CHLORIDE 0.9 % IV SOLN
100.0000 mg/m2 | Freq: Once | INTRAVENOUS | Status: AC
  Administered 2021-02-28: 150 mg via INTRAVENOUS
  Filled 2021-02-28: qty 7.5

## 2021-02-28 NOTE — Patient Instructions (Addendum)
[facility]  Discharge Instructions: Thank you for choosing [facility] to provide your oncology and hematology care.   If you have a lab appointment with the [facility], please go directly to the [facility] and check in at the registration area.   Wear comfortable clothing and clothing appropriate for easy access to any Portacath or PICC line.   We strive to give you quality time with your provider. You may need to reschedule your appointment if you arrive late (15 or more minutes).  Arriving late affects you and other patients whose appointments are after yours.  Also, if you miss three or more appointments without notifying the office, you may be dismissed from the clinic at the provider's discretion.      For prescription refill requests, have your pharmacy contact our office and allow 72 hours for refills to be completed.    Today you received the following chemotherapy and/or immunotherapy agents: Cosela & Etoposide    To help prevent nausea and vomiting after your treatment, we encourage you to take your nausea medication as directed.  BELOW ARE SYMPTOMS THAT SHOULD BE REPORTED IMMEDIATELY: *FEVER GREATER THAN 100.4 F (38 C) OR HIGHER *CHILLS OR SWEATING *NAUSEA AND VOMITING THAT IS NOT CONTROLLED WITH YOUR NAUSEA MEDICATION *UNUSUAL SHORTNESS OF BREATH *UNUSUAL BRUISING OR BLEEDING *URINARY PROBLEMS (pain or burning when urinating, or frequent urination) *BOWEL PROBLEMS (unusual diarrhea, constipation, pain near the anus) TENDERNESS IN MOUTH AND THROAT WITH OR WITHOUT PRESENCE OF ULCERS (sore throat, sores in mouth, or a toothache) UNUSUAL RASH, SWELLING OR PAIN  UNUSUAL VAGINAL DISCHARGE OR ITCHING   Items with * indicate a potential emergency and should be followed up as soon as possible or go to the Emergency Department if any problems should occur.  Please show the CHEMOTHERAPY ALERT CARD or IMMUNOTHERAPY ALERT CARD at  check-in to the Emergency Department and triage nurse.  Should you have questions after your visit or need to cancel or reschedule your appointment, please contact [facility]  Dept: [phone]  and follow the prompts.  Office hours are 8:00 a.m. to 4:30 p.m. Monday - Friday. Please note that voicemails left after 4:00 p.m. may not be returned until the following business day.  We are closed weekends and major holidays. You have access to a nurse at all times for urgent questions. Please call the main number to the clinic Dept: [phone] and follow the prompts.   For any non-urgent questions, you may also contact your provider using MyChart. We now offer e-Visits for anyone 69 and older to request care online for non-urgent symptoms. For details visit [email or web address].   Also download the MyChart app! Go to the app store, search "MyChart", open the app, select [facility], and log in with your MyChart username and password.  Due to Covid, a mask is required upon entering the hospital/clinic. If you do not have a mask, one will be given to you upon arrival. For doctor visits, patients may have 1 support person aged 41 or older with them. For treatment visits, patients cannot have anyone with them due to current Covid guidelines and our immunocompromised population.

## 2021-02-28 NOTE — Progress Notes (Signed)
Cosela added back; confirmed w/ PA team that it is authorized. Per Dr Julien Nordmann, no Ellen Henri will be needed.  Kennith Center, Pharm.D., CPP 02/28/2021@2 :28 PM

## 2021-02-28 NOTE — Addendum Note (Signed)
Addended by: Tora Kindred on: 02/28/2021 02:26 PM   Modules accepted: Orders

## 2021-03-01 ENCOUNTER — Ambulatory Visit: Payer: Medicare HMO

## 2021-03-01 ENCOUNTER — Inpatient Hospital Stay: Payer: Medicare HMO

## 2021-03-01 VITALS — BP 119/78 | HR 88 | Temp 97.6°F | Resp 18

## 2021-03-01 DIAGNOSIS — C3401 Malignant neoplasm of right main bronchus: Secondary | ICD-10-CM | POA: Diagnosis not present

## 2021-03-01 DIAGNOSIS — Z79899 Other long term (current) drug therapy: Secondary | ICD-10-CM | POA: Diagnosis not present

## 2021-03-01 DIAGNOSIS — C7931 Secondary malignant neoplasm of brain: Secondary | ICD-10-CM | POA: Diagnosis not present

## 2021-03-01 DIAGNOSIS — Z5111 Encounter for antineoplastic chemotherapy: Secondary | ICD-10-CM | POA: Diagnosis not present

## 2021-03-01 DIAGNOSIS — C3491 Malignant neoplasm of unspecified part of right bronchus or lung: Secondary | ICD-10-CM

## 2021-03-01 MED ORDER — TRILACICLIB DIHYDROCHLORIDE INJECTION 300 MG
240.0000 mg/m2 | Freq: Once | INTRAVENOUS | Status: AC
  Administered 2021-03-01: 360 mg via INTRAVENOUS
  Filled 2021-03-01: qty 24

## 2021-03-01 MED ORDER — SODIUM CHLORIDE 0.9 % IV SOLN
10.0000 mg | Freq: Once | INTRAVENOUS | Status: AC
  Administered 2021-03-01: 10 mg via INTRAVENOUS
  Filled 2021-03-01: qty 10

## 2021-03-01 MED ORDER — SODIUM CHLORIDE 0.9 % IV SOLN: Freq: Once | INTRAVENOUS | Status: AC

## 2021-03-01 MED ORDER — SODIUM CHLORIDE 0.9 % IV SOLN
100.0000 mg/m2 | Freq: Once | INTRAVENOUS | Status: AC
  Administered 2021-03-01: 150 mg via INTRAVENOUS
  Filled 2021-03-01: qty 7.5

## 2021-03-01 NOTE — Patient Instructions (Signed)
[facility]  Discharge Instructions: Thank you for choosing [facility] to provide your oncology and hematology care.   If you have a lab appointment with the Cancer Center, please go directly to the Cancer Center and check in at the registration area.   Wear comfortable clothing and clothing appropriate for easy access to any Portacath or PICC line.   We strive to give you quality time with your provider. You may need to reschedule your appointment if you arrive late (15 or more minutes).  Arriving late affects you and other patients whose appointments are after yours.  Also, if you miss three or more appointments without notifying the office, you may be dismissed from the clinic at the provider's discretion.      For prescription refill requests, have your pharmacy contact our office and allow 72 hours for refills to be completed.    Today you received the following chemotherapy and/or immunotherapy agents: etoposide.     To help prevent nausea and vomiting after your treatment, we encourage you to take your nausea medication as directed.  BELOW ARE SYMPTOMS THAT SHOULD BE REPORTED IMMEDIATELY: . *FEVER GREATER THAN 100.4 F (38 C) OR HIGHER . *CHILLS OR SWEATING . *NAUSEA AND VOMITING THAT IS NOT CONTROLLED WITH YOUR NAUSEA MEDICATION . *UNUSUAL SHORTNESS OF BREATH . *UNUSUAL BRUISING OR BLEEDING . *URINARY PROBLEMS (pain or burning when urinating, or frequent urination) . *BOWEL PROBLEMS (unusual diarrhea, constipation, pain near the anus) . TENDERNESS IN MOUTH AND THROAT WITH OR WITHOUT PRESENCE OF ULCERS (sore throat, sores in mouth, or a toothache) . UNUSUAL RASH, SWELLING OR PAIN  . UNUSUAL VAGINAL DISCHARGE OR ITCHING   Items with * indicate a potential emergency and should be followed up as soon as possible or go to the Emergency Department if any problems should occur.  Please show the CHEMOTHERAPY ALERT CARD or IMMUNOTHERAPY ALERT  CARD at check-in to the Emergency Department and triage nurse.  Should you have questions after your visit or need to cancel or reschedule your appointment, please contact [facility]  Dept: [phone]  and follow the prompts.  Office hours are 8:00 a.m. to 4:30 p.m. Monday - Friday. Please note that voicemails left after 4:00 p.m. may not be returned until the following business day.  We are closed weekends and major holidays. You have access to a nurse at all times for urgent questions. Please call the main number to the clinic Dept: [phone] and follow the prompts.   For any non-urgent questions, you may also contact your provider using MyChart. We now offer e-Visits for anyone 18 and older to request care online for non-urgent symptoms. For details visit [email or web address].   Also download the MyChart app! Go to the app store, search "MyChart", open the app, select [facility], and log in with your MyChart username and password.  Due to Covid, a mask is required upon entering the hospital/clinic. If you do not have a mask, one will be given to you upon arrival. For doctor visits, patients may have 1 support person aged 18 or older with them. For treatment visits, patients cannot have anyone with them due to current Covid guidelines and our immunocompromised population.   

## 2021-03-02 ENCOUNTER — Encounter: Payer: Self-pay | Admitting: Internal Medicine

## 2021-03-02 ENCOUNTER — Other Ambulatory Visit: Payer: Self-pay | Admitting: Physician Assistant

## 2021-03-02 ENCOUNTER — Inpatient Hospital Stay: Payer: Medicare HMO

## 2021-03-02 ENCOUNTER — Other Ambulatory Visit: Payer: Self-pay

## 2021-03-02 ENCOUNTER — Ambulatory Visit: Payer: Medicare HMO

## 2021-03-02 NOTE — Progress Notes (Signed)
                                                                                                                                                             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  Plan: The patient will receive a call in about one month from the radiation oncology department. She will continue follow up with Dr. Julien Nordmann as well. We will discuss the role of PCI as well and she has been aware of the benefits of prophylactic brain radiation.  ________________________________________________    Carola Rhine, PAC

## 2021-03-02 NOTE — Progress Notes (Signed)
Patient came in for an injection appointment and I spoke with Christy Kelley and clarified that she received Zarxio 02/24/2021 and does not need an injection today.

## 2021-03-05 ENCOUNTER — Ambulatory Visit: Payer: Medicare HMO

## 2021-03-05 ENCOUNTER — Telehealth: Payer: Self-pay | Admitting: Nurse Practitioner

## 2021-03-05 NOTE — Chronic Care Management (AMB) (Signed)
  Chronic Care Management   Outreach Note  03/05/2021 Name: Christy Kelley MRN: [id number] DOB: 04/01/1951  Referred by: Michela Pitcher, NP Reason for referral : No chief complaint on file.   An unsuccessful telephone outreach was attempted today. The patient was referred to the pharmacist for assistance with care management and care coordination.   Follow Up Plan:   Christy Kelley Upstream Scheduler

## 2021-03-06 ENCOUNTER — Ambulatory Visit: Payer: Medicare HMO

## 2021-03-06 ENCOUNTER — Inpatient Hospital Stay: Payer: Medicare HMO

## 2021-03-06 ENCOUNTER — Other Ambulatory Visit: Payer: Self-pay

## 2021-03-06 DIAGNOSIS — C3491 Malignant neoplasm of unspecified part of right bronchus or lung: Secondary | ICD-10-CM

## 2021-03-06 DIAGNOSIS — Z79899 Other long term (current) drug therapy: Secondary | ICD-10-CM | POA: Diagnosis not present

## 2021-03-06 DIAGNOSIS — C7931 Secondary malignant neoplasm of brain: Secondary | ICD-10-CM | POA: Diagnosis not present

## 2021-03-06 DIAGNOSIS — C3401 Malignant neoplasm of right main bronchus: Secondary | ICD-10-CM | POA: Diagnosis not present

## 2021-03-06 DIAGNOSIS — Z5111 Encounter for antineoplastic chemotherapy: Secondary | ICD-10-CM | POA: Diagnosis not present

## 2021-03-06 LAB — MAGNESIUM: Magnesium: 1.8 mg/dL (ref 1.7–2.4)

## 2021-03-06 LAB — CMP (CANCER CENTER ONLY)
ALT: 10 U/L (ref 0–44)
AST: 19 U/L (ref 15–41)
Albumin: 3.5 g/dL (ref 3.5–5.0)
Alkaline Phosphatase: 64 U/L (ref 38–126)
Anion gap: 9 (ref 5–15)
BUN: 21 mg/dL (ref 8–23)
CO2: 29 mmol/L (ref 22–32)
Calcium: 9 mg/dL (ref 8.9–10.3)
Chloride: 101 mmol/L (ref 98–111)
Creatinine: 0.66 mg/dL (ref 0.44–1.00)
GFR, Estimated: >60 mL/min (ref >60)
Glucose, Bld: 75 mg/dL (ref 70–99)
Potassium: 3.8 mmol/L (ref 3.5–5.1)
Sodium: 139 mmol/L (ref 135–145)
Total Bilirubin: 0.4 mg/dL (ref 0.3–1.2)
Total Protein: 5.7 g/dL — ABNORMAL LOW (ref 6.5–8.1)

## 2021-03-06 LAB — CBC WITH DIFFERENTIAL (CANCER CENTER ONLY)
Abs Immature Granulocytes: 0.02 10*3/uL (ref 0.00–0.07)
Basophils Absolute: 0 10*3/uL (ref 0.0–0.1)
Basophils Relative: 1 %
Eosinophils Absolute: 0.1 10*3/uL (ref 0.0–0.5)
Eosinophils Relative: 3 %
HCT: 30 % — ABNORMAL LOW (ref 36.0–46.0)
Hemoglobin: 10.3 g/dL — ABNORMAL LOW (ref 12.0–15.0)
Immature Granulocytes: 1 %
Lymphocytes Relative: 15 %
Lymphs Abs: 0.3 10*3/uL — ABNORMAL LOW (ref 0.7–4.0)
MCH: 32.4 pg (ref 26.0–34.0)
MCHC: 34.3 g/dL (ref 30.0–36.0)
MCV: 94.3 fL (ref 80.0–100.0)
Monocytes Absolute: 0.1 10*3/uL (ref 0.1–1.0)
Monocytes Relative: 3 %
Neutro Abs: 1.7 10*3/uL (ref 1.7–7.7)
Neutrophils Relative %: 77 %
Platelet Count: 225 10*3/uL (ref 150–400)
RBC: 3.18 MIL/uL — ABNORMAL LOW (ref 3.87–5.11)
RDW: 14.1 % (ref 11.5–15.5)
Smear Review: NORMAL
WBC Count: 2.2 10*3/uL — ABNORMAL LOW (ref 4.0–10.5)
nRBC: 0 % (ref 0.0–0.2)

## 2021-03-07 ENCOUNTER — Ambulatory Visit: Payer: Medicare HMO

## 2021-03-08 ENCOUNTER — Ambulatory Visit: Payer: Medicare HMO

## 2021-03-09 ENCOUNTER — Encounter: Payer: Self-pay | Admitting: Internal Medicine

## 2021-03-09 ENCOUNTER — Ambulatory Visit: Payer: Medicare HMO

## 2021-03-12 ENCOUNTER — Ambulatory Visit: Payer: Medicare HMO | Admitting: Internal Medicine

## 2021-03-12 ENCOUNTER — Other Ambulatory Visit: Payer: Medicare HMO

## 2021-03-12 ENCOUNTER — Ambulatory Visit: Payer: Medicare HMO

## 2021-03-12 ENCOUNTER — Telehealth: Payer: Self-pay | Admitting: Nurse Practitioner

## 2021-03-12 NOTE — Chronic Care Management (AMB) (Signed)
  Chronic Care Management   Note  03/12/2021 Name: Christy Kelley MRN: [id number] DOB: [birth year]  Christy Kelley is a 70 y.o. year old female who is a primary care patient of Michela Pitcher, NP. I reached out to Christy Kelley by phone today in response to a referral sent by Christy Kelley's PCP, Michela Pitcher, NP.   Christy Kelley was given information about Chronic Care Management services today including:  CCM service includes personalized support from designated clinical staff supervised by her physician, including individualized plan of care and coordination with other care providers 24/7 contact phone numbers for assistance for urgent and routine care needs. Service will only be billed when office clinical staff spend 20 minutes or more in a month to coordinate care. Only one practitioner may furnish and bill the service in a calendar month. The patient may stop CCM services at any time (effective at the end of the month) by phone call to the office staff.   [facility] verbally agreed to assistance and services provided by embedded care coordination/care management team today.  Follow up plan:   [place] Secretary/administrator

## 2021-03-13 ENCOUNTER — Encounter: Payer: Self-pay | Admitting: Internal Medicine

## 2021-03-13 ENCOUNTER — Ambulatory Visit: Payer: Medicare HMO

## 2021-03-13 ENCOUNTER — Other Ambulatory Visit: Payer: Self-pay

## 2021-03-13 ENCOUNTER — Inpatient Hospital Stay: Payer: Medicare HMO

## 2021-03-13 DIAGNOSIS — Z79899 Other long term (current) drug therapy: Secondary | ICD-10-CM | POA: Diagnosis not present

## 2021-03-13 DIAGNOSIS — C3491 Malignant neoplasm of unspecified part of right bronchus or lung: Secondary | ICD-10-CM

## 2021-03-13 DIAGNOSIS — C3401 Malignant neoplasm of right main bronchus: Secondary | ICD-10-CM | POA: Diagnosis not present

## 2021-03-13 DIAGNOSIS — Z5111 Encounter for antineoplastic chemotherapy: Secondary | ICD-10-CM | POA: Diagnosis not present

## 2021-03-13 DIAGNOSIS — C7931 Secondary malignant neoplasm of brain: Secondary | ICD-10-CM | POA: Diagnosis not present

## 2021-03-13 LAB — CBC WITH DIFFERENTIAL (CANCER CENTER ONLY)
Abs Immature Granulocytes: 0.01 10*3/uL (ref 0.00–0.07)
Basophils Absolute: 0 10*3/uL (ref 0.0–0.1)
Basophils Relative: 2 %
Eosinophils Absolute: 0.1 10*3/uL (ref 0.0–0.5)
Eosinophils Relative: 4 %
HCT: 27.4 % — ABNORMAL LOW (ref 36.0–46.0)
Hemoglobin: 9.3 g/dL — ABNORMAL LOW (ref 12.0–15.0)
Immature Granulocytes: 1 %
Lymphocytes Relative: 25 %
Lymphs Abs: 0.5 10*3/uL — ABNORMAL LOW (ref 0.7–4.0)
MCH: 32.3 pg (ref 26.0–34.0)
MCHC: 33.9 g/dL (ref 30.0–36.0)
MCV: 95.1 fL (ref 80.0–100.0)
Monocytes Absolute: 0.3 10*3/uL (ref 0.1–1.0)
Monocytes Relative: 13 %
Neutro Abs: 1.1 10*3/uL — ABNORMAL LOW (ref 1.7–7.7)
Neutrophils Relative %: 55 %
Platelet Count: 153 10*3/uL (ref 150–400)
RBC: 2.88 MIL/uL — ABNORMAL LOW (ref 3.87–5.11)
RDW: 15 % (ref 11.5–15.5)
WBC Count: 1.9 10*3/uL — ABNORMAL LOW (ref 4.0–10.5)
nRBC: 0 % (ref 0.0–0.2)

## 2021-03-13 LAB — CMP (CANCER CENTER ONLY)
ALT: 10 U/L (ref 0–44)
AST: 22 U/L (ref 15–41)
Albumin: 3.8 g/dL (ref 3.5–5.0)
Alkaline Phosphatase: 73 U/L (ref 38–126)
Anion gap: 8 (ref 5–15)
BUN: 13 mg/dL (ref 8–23)
CO2: 28 mmol/L (ref 22–32)
Calcium: 8.7 mg/dL — ABNORMAL LOW (ref 8.9–10.3)
Chloride: 104 mmol/L (ref 98–111)
Creatinine: 0.64 mg/dL (ref 0.44–1.00)
GFR, Estimated: >60 mL/min (ref >60)
Glucose, Bld: 90 mg/dL (ref 70–99)
Potassium: 3.7 mmol/L (ref 3.5–5.1)
Sodium: 140 mmol/L (ref 135–145)
Total Bilirubin: <0.2 mg/dL — ABNORMAL LOW (ref 0.3–1.2)
Total Protein: 6.1 g/dL — ABNORMAL LOW (ref 6.5–8.1)

## 2021-03-13 LAB — MAGNESIUM: Magnesium: 2.1 mg/dL (ref 1.7–2.4)

## 2021-03-14 ENCOUNTER — Ambulatory Visit: Payer: Medicare HMO

## 2021-03-19 ENCOUNTER — Ambulatory Visit: Payer: Medicare HMO

## 2021-03-20 ENCOUNTER — Ambulatory Visit: Payer: Medicare HMO

## 2021-03-20 ENCOUNTER — Inpatient Hospital Stay: Payer: Medicare HMO

## 2021-03-20 ENCOUNTER — Other Ambulatory Visit: Payer: Medicare HMO

## 2021-03-20 ENCOUNTER — Other Ambulatory Visit: Payer: Self-pay

## 2021-03-20 ENCOUNTER — Inpatient Hospital Stay (HOSPITAL_BASED_OUTPATIENT_CLINIC_OR_DEPARTMENT_OTHER): Payer: Medicare HMO | Admitting: Internal Medicine

## 2021-03-20 VITALS — BP 126/91 | HR 90 | Temp 98.1°F | Resp 17 | Ht 63.0 in | Wt 125.8 lb

## 2021-03-20 DIAGNOSIS — C3490 Malignant neoplasm of unspecified part of unspecified bronchus or lung: Secondary | ICD-10-CM

## 2021-03-20 DIAGNOSIS — Z5111 Encounter for antineoplastic chemotherapy: Secondary | ICD-10-CM

## 2021-03-20 DIAGNOSIS — Z5112 Encounter for antineoplastic immunotherapy: Secondary | ICD-10-CM

## 2021-03-20 DIAGNOSIS — T451X5A Adverse effect of antineoplastic and immunosuppressive drugs, initial encounter: Secondary | ICD-10-CM

## 2021-03-20 DIAGNOSIS — C3491 Malignant neoplasm of unspecified part of right bronchus or lung: Secondary | ICD-10-CM

## 2021-03-20 DIAGNOSIS — Z79899 Other long term (current) drug therapy: Secondary | ICD-10-CM | POA: Diagnosis not present

## 2021-03-20 DIAGNOSIS — C7931 Secondary malignant neoplasm of brain: Secondary | ICD-10-CM | POA: Diagnosis not present

## 2021-03-20 DIAGNOSIS — D701 Agranulocytosis secondary to cancer chemotherapy: Secondary | ICD-10-CM | POA: Diagnosis not present

## 2021-03-20 DIAGNOSIS — C3401 Malignant neoplasm of right main bronchus: Secondary | ICD-10-CM | POA: Diagnosis not present

## 2021-03-20 LAB — CMP (CANCER CENTER ONLY)
ALT: 18 U/L (ref 0–44)
AST: 31 U/L (ref 15–41)
Albumin: 4.1 g/dL (ref 3.5–5.0)
Alkaline Phosphatase: 71 U/L (ref 38–126)
Anion gap: 8 (ref 5–15)
BUN: 22 mg/dL (ref 8–23)
CO2: 27 mmol/L (ref 22–32)
Calcium: 8.9 mg/dL (ref 8.9–10.3)
Chloride: 104 mmol/L (ref 98–111)
Creatinine: 0.66 mg/dL (ref 0.44–1.00)
GFR, Estimated: >60 mL/min (ref >60)
Glucose, Bld: 76 mg/dL (ref 70–99)
Potassium: 3.8 mmol/L (ref 3.5–5.1)
Sodium: 139 mmol/L (ref 135–145)
Total Bilirubin: 0.4 mg/dL (ref 0.3–1.2)
Total Protein: 6.6 g/dL (ref 6.5–8.1)

## 2021-03-20 LAB — CBC WITH DIFFERENTIAL (CANCER CENTER ONLY)
Abs Immature Granulocytes: 0.02 10*3/uL (ref 0.00–0.07)
Basophils Absolute: 0 10*3/uL (ref 0.0–0.1)
Basophils Relative: 2 %
Eosinophils Absolute: 0.1 10*3/uL (ref 0.0–0.5)
Eosinophils Relative: 6 %
HCT: 32.1 % — ABNORMAL LOW (ref 36.0–46.0)
Hemoglobin: 10.6 g/dL — ABNORMAL LOW (ref 12.0–15.0)
Immature Granulocytes: 1 %
Lymphocytes Relative: 30 %
Lymphs Abs: 0.6 10*3/uL — ABNORMAL LOW (ref 0.7–4.0)
MCH: 32.3 pg (ref 26.0–34.0)
MCHC: 33 g/dL (ref 30.0–36.0)
MCV: 97.9 fL (ref 80.0–100.0)
Monocytes Absolute: 0.5 10*3/uL (ref 0.1–1.0)
Monocytes Relative: 22 %
Neutro Abs: 0.8 10*3/uL — ABNORMAL LOW (ref 1.7–7.7)
Neutrophils Relative %: 39 %
Platelet Count: 361 10*3/uL (ref 150–400)
RBC: 3.28 MIL/uL — ABNORMAL LOW (ref 3.87–5.11)
RDW: 17.2 % — ABNORMAL HIGH (ref 11.5–15.5)
WBC Count: 2.1 10*3/uL — ABNORMAL LOW (ref 4.0–10.5)
nRBC: 0 % (ref 0.0–0.2)

## 2021-03-20 LAB — MAGNESIUM: Magnesium: 2.2 mg/dL (ref 1.7–2.4)

## 2021-03-20 LAB — TSH: TSH: 1.345 u[IU]/mL (ref 0.350–4.500)

## 2021-03-20 NOTE — Progress Notes (Signed)
[facility] [phone]) [phone]   Fax:(336) [phone]  OFFICE PROGRESS NOTE  Michela Pitcher, NP [place] [address]  DIAGNOSIS: Extensive stage (T2 a, N2, M1b) small cell lung cancer diagnosed in September 2022 and presented with bulky right hilar mass with mediastinal lymphadenopathy and occlusion of the distal right main bronchus and right lower and middle lobe bronchi by the mass lesion with few subcentimeter brain metastasis.  PRIOR THERAPY: Systemic chemotherapy with cisplatin 80 Mg/M2 on day 1 and etoposide 100 Mg/M2 on days 1, 2 and 3 status post 1 cycle.  This treatment was discontinued after the patient was found to have brain metastasis.  CURRENT THERAPY: Systemic chemotherapy with carboplatin for AUC of 5 on day 1, etoposide 100 Mg/M2 on days 1, 2 and 3 with Cosela before chemotherapy as well as Imfinzi 1500 Mg IV every 3 weeks.  First dose February 20, 2021.  Status post 1 cycle.  INTERVAL HISTORY: Christy Kelley 70 y.o. female returns to the clinic today for follow-up visit.  The patient is feeling fine today with no concerning complaints.  She tolerated the first cycle of this treatment fairly well with no significant adverse effects.  She denied having any nausea, vomiting, diarrhea or constipation.  She has no headache or visual changes.  She denied having any significant weight loss or night sweats.  She has no fever or chills.  The patient is here today for evaluation before starting cycle #2.  MEDICAL HISTORY: Past Medical History:  Diagnosis Date   Arthritis    Lung cancer ([place])    Dx 9/27 with lung cancer    ALLERGIES:  has No Known Allergies.  MEDICATIONS:  Current Outpatient Medications  Medication Sig Dispense Refill   albuterol (VENTOLIN HFA) 108 (90 Base) MCG/ACT inhaler Inhale 2 puffs into the lungs every 6 (six) hours as needed for wheezing or shortness of breath. 1 each 1   Ascorbic Acid (VITAMIN C PO) Take 1,000  mg by mouth daily.     megestrol (MEGACE ES) 625 MG/5ML suspension Take 5 mLs (625 mg total) by mouth daily. (Patient not taking: Reported on 02/20/2021) 150 mL 0   MELATONIN PO Take by mouth.     meloxicam (MOBIC) 15 MG tablet Take 1 tablet (15 mg total) by mouth daily. (Patient taking differently: Take 15 mg by mouth every other day.) 30 tablet 2   methylPREDNISolone (MEDROL DOSEPAK) 4 MG TBPK tablet Use as instructed 21 tablet 0   Multiple Vitamin (MULTIVITAMIN) tablet Take 1 tablet by mouth daily.     prochlorperazine (COMPAZINE) 10 MG tablet Take 1 tablet (10 mg total) by mouth every 6 (six) hours as needed for nausea or vomiting. 30 tablet 0   Sodium Sulfate-Mag Sulfate-KCl (SUTAB) [phone] MG TABS Take 12 tablets by mouth as directed. 24 tablet 0   temazepam (RESTORIL) 15 MG capsule Take 1 capsule (15 mg total) by mouth at bedtime as needed for sleep. 30 capsule 0   Vitamin D, Cholecalciferol, 25 MCG (1000 UT) TABS Take 1,000 Units by mouth daily.     No current facility-administered medications for this visit.    SURGICAL HISTORY:  Past Surgical History:  Procedure Laterality Date   BRONCHIAL WASHINGS  01/18/2021   Procedure: BRONCHIAL WASHINGS;  Surgeon: Julian Hy, DO;  Location: [facility];  Service: Endoscopy;;   ENDOBRONCHIAL ULTRASOUND N/A 01/18/2021   Procedure: ENDOBRONCHIAL ULTRASOUND;  Surgeon: Julian Hy, DO;  Location: [facility];  Service: Endoscopy;  Laterality: N/A;   FINE NEEDLE ASPIRATION  01/18/2021   Procedure: FINE NEEDLE ASPIRATION (FNA) LINEAR;  Surgeon: Julian Hy, DO;  Location: [facility];  Service: Endoscopy;;   VIDEO BRONCHOSCOPY N/A 01/18/2021   Procedure: VIDEO BRONCHOSCOPY WITHOUT FLUORO;  Surgeon: Julian Hy, DO;  Location: [facility];  Service: Endoscopy;  Laterality: N/A;    REVIEW OF SYSTEMS:  Constitutional: negative Eyes: negative Ears, nose, mouth, throat, and face: negative Respiratory: negative Cardiovascular:  negative Gastrointestinal: negative Genitourinary:negative Integument/breast: negative Hematologic/lymphatic: negative Musculoskeletal:negative Neurological: negative Behavioral/Psych: negative Endocrine: negative Allergic/Immunologic: negative   PHYSICAL EXAMINATION: General appearance: alert, cooperative, fatigued, and no distress Head: Normocephalic, without obvious abnormality, atraumatic Neck: no adenopathy, no JVD, supple, symmetrical, trachea midline, and thyroid not enlarged, symmetric, no tenderness/mass/nodules Lymph nodes: Cervical, supraclavicular, and axillary nodes normal. Resp: clear to auscultation bilaterally Back: symmetric, no curvature. ROM normal. No CVA tenderness. Cardio: regular rate and rhythm, S1, S2 normal, no murmur, click, rub or gallop GI: soft, non-tender; bowel sounds normal; no masses,  no organomegaly Extremities: extremities normal, atraumatic, no cyanosis or edema Neurologic: Alert and oriented X 3, normal strength and tone. Normal symmetric reflexes. Normal coordination and gait  ECOG PERFORMANCE STATUS: 1 - Symptomatic but completely ambulatory  Blood pressure (!) 126/91, pulse 90, temperature 98.1 F (36.7 C), temperature source Temporal, resp. rate 17, height 5\' 3"  (1.6 m), weight 125 lb 12.8 oz (57.1 kg), SpO2 100 %.  LABORATORY DATA: Lab Results  Component Value Date   WBC 2.1 (L) 03/20/2021   HGB 10.6 (L) 03/20/2021   HCT 32.1 (L) 03/20/2021   MCV 97.9 03/20/2021   PLT 361 03/20/2021      Chemistry      Component Value Date/Time   NA 139 03/20/2021 0729   K 3.8 03/20/2021 0729   CL 104 03/20/2021 0729   CO2 27 03/20/2021 0729   BUN 22 03/20/2021 0729   CREATININE 0.66 03/20/2021 0729      Component Value Date/Time   CALCIUM 8.9 03/20/2021 0729   ALKPHOS 71 03/20/2021 0729   AST 31 03/20/2021 0729   ALT 18 03/20/2021 0729   BILITOT 0.4 03/20/2021 0729       RADIOGRAPHIC STUDIES: No results found.  ASSESSMENT AND  PLAN: This is a very pleasant 70 years old white female recently diagnosed with extensive stage (T2 a, N2, M1b) small cell lung cancer presented with bulky right hilar mass and mediastinal lymphadenopathy with occlusion of the distal right mainstem bronchus and right lower lobe bronchus as well as new subcentimeter brain metastasis diagnosed in September 2022. The patient started systemic chemotherapy with cisplatin and etoposide last week and tolerated the first week of her treatment fairly well except for increasing fatigue and weakness as well as diarrhea, lack of appetite and weight loss. Unfortunately the staging work-up showed multiple subcentimeter brain metastasis.  The PET scan showed no evidence of metastatic disease outside the previously known areas in the chest. She is here today for evaluation before starting the first cycle of palliative systemic chemotherapy but changing the regimen to carboplatin for AUC of 5 on day 1 and etoposide 100 Mg/M2 on days 1, 2 and 3 with Cosela 240 Mg/M2 before chemotherapy as well as Imfinzi 1500 Mg IV every 3 weeks on day one of the chemotherapy.  Status post 1 cycle. CBC today showed persistent neutropenia and it will not meet the parameters for treatment today. I will delay her treatment  by 1 week until improvement of her neutrophil count. I will see the patient back for follow-up visit in 4 weeks before starting cycle #3 with repeat CT scan of the chest, abdomen pelvis as well as MRI of the brain for restaging of her disease. For the lack of appetite, the patient is currently on Megace ES 625 mg p.o. daily. For the insomnia, I will start the patient on Restoril 15 mg p.o. nightly The patient was advised to call immediately if she has any other concerning symptoms in the interval. The patient voices understanding of current disease status and treatment options and is in agreement with the current care plan.  All questions were answered. The patient knows  to call the clinic with any problems, questions or concerns. We can certainly see the patient much sooner if necessary.  The total time spent in the appointment was 30 minutes.  Disclaimer: This note was dictated with voice recognition software. Similar sounding words can inadvertently be transcribed and may not be corrected upon review.

## 2021-03-21 ENCOUNTER — Inpatient Hospital Stay: Payer: Medicare HMO

## 2021-03-21 ENCOUNTER — Ambulatory Visit: Payer: Medicare HMO

## 2021-03-21 ENCOUNTER — Ambulatory Visit: Payer: Medicare HMO | Admitting: Radiation Oncology

## 2021-03-22 ENCOUNTER — Ambulatory Visit: Payer: Medicare HMO | Admitting: Radiation Oncology

## 2021-03-22 ENCOUNTER — Inpatient Hospital Stay: Payer: Medicare HMO

## 2021-03-22 ENCOUNTER — Ambulatory Visit: Payer: Medicare HMO

## 2021-03-23 ENCOUNTER — Ambulatory Visit: Payer: Medicare HMO

## 2021-03-26 ENCOUNTER — Ambulatory Visit
Admission: RE | Admit: 2021-03-26 | Discharge: 2021-03-26 | Disposition: A | Discharge status: Home / Self Care | Payer: Medicare HMO | Source: Ambulatory Visit | Attending: Radiation Oncology | Admitting: Radiation Oncology

## 2021-03-26 ENCOUNTER — Ambulatory Visit: Payer: Medicare HMO

## 2021-03-26 DIAGNOSIS — C3491 Malignant neoplasm of unspecified part of right bronchus or lung: Secondary | ICD-10-CM | POA: Insufficient documentation

## 2021-03-26 MED FILL — Dexamethasone Sodium Phosphate Inj 100 MG/10ML: INTRAMUSCULAR | Qty: 1 | Status: AC

## 2021-03-26 MED FILL — Fosaprepitant Dimeglumine For IV Infusion 150 MG (Base Eq): INTRAVENOUS | Qty: 5 | Status: AC

## 2021-03-26 NOTE — Progress Notes (Signed)
  Radiation Oncology         [phone] ________________________________  Name: Christy Kelley MRN: [id number]  Date of Service: 03/26/2021  DOB: [birth year]  Post Treatment Telephone Note  Diagnosis:   Limited Stage Small cell carcinoma of the right hilum  Interval Since Last Radiation:  4 weeks   02/05/2021 through 02/26/2021 Site Technique Total Dose (Gy) Dose per Fx (Gy) Completed Fx Beam Energies  Lung, Right: Lung_Rt 3D 6/6 2 3/3 6X  Lung, Right: Lung_Rt_RxChg 3D 30/30 2.5 12/12 6X   Narrative:  I attempted to reach the patient today for routine follow-up. During treatment she did very well with radiotherapy and did not have significant desquamation. I reacher her husband and he states she has been feeling well on chemotherapy so far.  Impression/Plan: 1. Limited Stage Small cell carcinoma of the right hilum. The patient has been doing well since completion of radiotherapy. We discussed that we would be happy to continue to follow her as needed, but she will also continue to follow up with Dr. Julien Nordmann in medical oncology.  We will follow up with her next MRI brain later this month, and she and her husband are both aware of the role of whole brain radiation if she's able to take a short break from chemotherapy to treat her brain.       Carola Rhine, PAC

## 2021-03-27 ENCOUNTER — Inpatient Hospital Stay: Payer: Medicare HMO | Attending: Internal Medicine

## 2021-03-27 ENCOUNTER — Other Ambulatory Visit: Payer: Self-pay

## 2021-03-27 DIAGNOSIS — C3401 Malignant neoplasm of right main bronchus: Secondary | ICD-10-CM | POA: Insufficient documentation

## 2021-03-27 DIAGNOSIS — Z79899 Other long term (current) drug therapy: Secondary | ICD-10-CM | POA: Diagnosis not present

## 2021-03-27 DIAGNOSIS — R630 Anorexia: Secondary | ICD-10-CM | POA: Diagnosis not present

## 2021-03-27 DIAGNOSIS — G4700 Insomnia, unspecified: Secondary | ICD-10-CM | POA: Diagnosis not present

## 2021-03-27 DIAGNOSIS — C7931 Secondary malignant neoplasm of brain: Secondary | ICD-10-CM | POA: Insufficient documentation

## 2021-03-27 DIAGNOSIS — Z5111 Encounter for antineoplastic chemotherapy: Secondary | ICD-10-CM | POA: Insufficient documentation

## 2021-03-27 DIAGNOSIS — Z923 Personal history of irradiation: Secondary | ICD-10-CM | POA: Insufficient documentation

## 2021-03-27 DIAGNOSIS — C3491 Malignant neoplasm of unspecified part of right bronchus or lung: Secondary | ICD-10-CM

## 2021-03-27 LAB — CBC WITH DIFFERENTIAL (CANCER CENTER ONLY)
Abs Immature Granulocytes: 0.04 10*3/uL (ref 0.00–0.07)
Basophils Absolute: 0.1 10*3/uL (ref 0.0–0.1)
Basophils Relative: 2 %
Eosinophils Absolute: 0.1 10*3/uL (ref 0.0–0.5)
Eosinophils Relative: 2 %
HCT: 33.3 % — ABNORMAL LOW (ref 36.0–46.0)
Hemoglobin: 10.8 g/dL — ABNORMAL LOW (ref 12.0–15.0)
Immature Granulocytes: 1 %
Lymphocytes Relative: 17 %
Lymphs Abs: 0.7 10*3/uL (ref 0.7–4.0)
MCH: 31.8 pg (ref 26.0–34.0)
MCHC: 32.4 g/dL (ref 30.0–36.0)
MCV: 97.9 fL (ref 80.0–100.0)
Monocytes Absolute: 0.5 10*3/uL (ref 0.1–1.0)
Monocytes Relative: 13 %
Neutro Abs: 2.7 10*3/uL (ref 1.7–7.7)
Neutrophils Relative %: 65 %
Platelet Count: 312 10*3/uL (ref 150–400)
RBC: 3.4 MIL/uL — ABNORMAL LOW (ref 3.87–5.11)
RDW: 17.3 % — ABNORMAL HIGH (ref 11.5–15.5)
WBC Count: 4.1 10*3/uL (ref 4.0–10.5)
nRBC: 0 % (ref 0.0–0.2)

## 2021-03-27 LAB — CMP (CANCER CENTER ONLY)
ALT: 19 U/L (ref 0–44)
AST: 32 U/L (ref 15–41)
Albumin: 3.9 g/dL (ref 3.5–5.0)
Alkaline Phosphatase: 79 U/L (ref 38–126)
Anion gap: 11 (ref 5–15)
BUN: 23 mg/dL (ref 8–23)
CO2: 25 mmol/L (ref 22–32)
Calcium: 9.2 mg/dL (ref 8.9–10.3)
Chloride: 106 mmol/L (ref 98–111)
Creatinine: 0.71 mg/dL (ref 0.44–1.00)
GFR, Estimated: >60 mL/min (ref >60)
Glucose, Bld: 90 mg/dL (ref 70–99)
Potassium: 4.2 mmol/L (ref 3.5–5.1)
Sodium: 142 mmol/L (ref 135–145)
Total Bilirubin: 0.3 mg/dL (ref 0.3–1.2)
Total Protein: 6.6 g/dL (ref 6.5–8.1)

## 2021-03-27 LAB — MAGNESIUM: Magnesium: 2.1 mg/dL (ref 1.7–2.4)

## 2021-04-02 ENCOUNTER — Ambulatory Visit: Payer: Medicare HMO

## 2021-04-02 ENCOUNTER — Other Ambulatory Visit: Payer: Medicare HMO

## 2021-04-02 ENCOUNTER — Ambulatory Visit: Payer: Medicare HMO | Admitting: Internal Medicine

## 2021-04-03 ENCOUNTER — Ambulatory Visit: Payer: Medicare HMO

## 2021-04-03 ENCOUNTER — Other Ambulatory Visit: Payer: Medicare HMO

## 2021-04-03 ENCOUNTER — Inpatient Hospital Stay: Payer: Medicare HMO

## 2021-04-03 ENCOUNTER — Ambulatory Visit: Payer: Medicare HMO | Admitting: Internal Medicine

## 2021-04-04 ENCOUNTER — Ambulatory Visit: Payer: Medicare HMO

## 2021-04-04 ENCOUNTER — Encounter: Payer: Self-pay | Admitting: Internal Medicine

## 2021-04-05 ENCOUNTER — Ambulatory Visit: Payer: Medicare HMO

## 2021-04-10 ENCOUNTER — Encounter: Payer: Self-pay | Admitting: Internal Medicine

## 2021-04-10 ENCOUNTER — Other Ambulatory Visit: Payer: Self-pay

## 2021-04-10 ENCOUNTER — Inpatient Hospital Stay: Payer: Medicare HMO | Admitting: Internal Medicine

## 2021-04-10 ENCOUNTER — Inpatient Hospital Stay: Payer: Medicare HMO

## 2021-04-10 VITALS — BP 127/79 | HR 98 | Temp 96.1°F | Resp 18 | Ht 63.0 in | Wt 128.0 lb

## 2021-04-10 DIAGNOSIS — T451X5A Adverse effect of antineoplastic and immunosuppressive drugs, initial encounter: Secondary | ICD-10-CM | POA: Diagnosis not present

## 2021-04-10 DIAGNOSIS — C3401 Malignant neoplasm of right main bronchus: Secondary | ICD-10-CM | POA: Diagnosis not present

## 2021-04-10 DIAGNOSIS — D701 Agranulocytosis secondary to cancer chemotherapy: Secondary | ICD-10-CM

## 2021-04-10 DIAGNOSIS — C3491 Malignant neoplasm of unspecified part of right bronchus or lung: Secondary | ICD-10-CM

## 2021-04-10 DIAGNOSIS — Z79899 Other long term (current) drug therapy: Secondary | ICD-10-CM | POA: Diagnosis not present

## 2021-04-10 DIAGNOSIS — Z923 Personal history of irradiation: Secondary | ICD-10-CM | POA: Diagnosis not present

## 2021-04-10 DIAGNOSIS — C7931 Secondary malignant neoplasm of brain: Secondary | ICD-10-CM | POA: Diagnosis not present

## 2021-04-10 DIAGNOSIS — Z5111 Encounter for antineoplastic chemotherapy: Secondary | ICD-10-CM

## 2021-04-10 DIAGNOSIS — Z5112 Encounter for antineoplastic immunotherapy: Secondary | ICD-10-CM

## 2021-04-10 DIAGNOSIS — R630 Anorexia: Secondary | ICD-10-CM | POA: Diagnosis not present

## 2021-04-10 DIAGNOSIS — G4700 Insomnia, unspecified: Secondary | ICD-10-CM | POA: Diagnosis not present

## 2021-04-10 LAB — CBC WITH DIFFERENTIAL (CANCER CENTER ONLY)
Abs Immature Granulocytes: 0.01 10*3/uL (ref 0.00–0.07)
Basophils Absolute: 0.1 10*3/uL (ref 0.0–0.1)
Basophils Relative: 2 %
Eosinophils Absolute: 0.3 10*3/uL (ref 0.0–0.5)
Eosinophils Relative: 8 %
HCT: 33.1 % — ABNORMAL LOW (ref 36.0–46.0)
Hemoglobin: 11 g/dL — ABNORMAL LOW (ref 12.0–15.0)
Immature Granulocytes: 0 %
Lymphocytes Relative: 17 %
Lymphs Abs: 0.7 10*3/uL (ref 0.7–4.0)
MCH: 32.6 pg (ref 26.0–34.0)
MCHC: 33.2 g/dL (ref 30.0–36.0)
MCV: 98.2 fL (ref 80.0–100.0)
Monocytes Absolute: 0.4 10*3/uL (ref 0.1–1.0)
Monocytes Relative: 10 %
Neutro Abs: 2.6 10*3/uL (ref 1.7–7.7)
Neutrophils Relative %: 63 %
Platelet Count: 297 10*3/uL (ref 150–400)
RBC: 3.37 MIL/uL — ABNORMAL LOW (ref 3.87–5.11)
RDW: 17 % — ABNORMAL HIGH (ref 11.5–15.5)
WBC Count: 4.1 10*3/uL (ref 4.0–10.5)
nRBC: 0 % (ref 0.0–0.2)

## 2021-04-10 LAB — CMP (CANCER CENTER ONLY)
ALT: 15 U/L (ref 0–44)
AST: 27 U/L (ref 15–41)
Albumin: 4 g/dL (ref 3.5–5.0)
Alkaline Phosphatase: 72 U/L (ref 38–126)
Anion gap: 8 (ref 5–15)
BUN: 22 mg/dL (ref 8–23)
CO2: 27 mmol/L (ref 22–32)
Calcium: 9.3 mg/dL (ref 8.9–10.3)
Chloride: 107 mmol/L (ref 98–111)
Creatinine: 0.68 mg/dL (ref 0.44–1.00)
GFR, Estimated: >60 mL/min (ref >60)
Glucose, Bld: 90 mg/dL (ref 70–99)
Potassium: 4 mmol/L (ref 3.5–5.1)
Sodium: 142 mmol/L (ref 135–145)
Total Bilirubin: 0.3 mg/dL (ref 0.3–1.2)
Total Protein: 6.3 g/dL — ABNORMAL LOW (ref 6.5–8.1)

## 2021-04-10 LAB — MAGNESIUM: Magnesium: 2.1 mg/dL (ref 1.7–2.4)

## 2021-04-10 LAB — TSH: TSH: 1.082 u[IU]/mL (ref 0.308–3.960)

## 2021-04-10 MED ORDER — SODIUM CHLORIDE 0.9 % IV SOLN
351.0000 mg | Freq: Once | INTRAVENOUS | Status: AC
  Administered 2021-04-10: 13:00:00 350 mg via INTRAVENOUS
  Filled 2021-04-10: qty 35

## 2021-04-10 MED ORDER — PALONOSETRON HCL INJECTION 0.25 MG/5ML
0.2500 mg | Freq: Once | INTRAVENOUS | Status: AC
  Administered 2021-04-10: 11:00:00 0.25 mg via INTRAVENOUS
  Filled 2021-04-10: qty 5

## 2021-04-10 MED ORDER — FOSAPREPITANT DIMEGLUMINE INJECTION 150 MG
150.0000 mg | Freq: Once | INTRAVENOUS | Status: AC
  Administered 2021-04-10: 10:00:00 150 mg via INTRAVENOUS
  Filled 2021-04-10: qty 150
  Filled 2021-04-10: qty 5

## 2021-04-10 MED ORDER — SODIUM CHLORIDE 0.9 % IV SOLN
100.0000 mg/m2 | Freq: Once | INTRAVENOUS | Status: AC
  Administered 2021-04-10: 14:00:00 150 mg via INTRAVENOUS
  Filled 2021-04-10: qty 7.5

## 2021-04-10 MED ORDER — SODIUM CHLORIDE 0.9 % IV SOLN: Freq: Once | INTRAVENOUS | Status: AC

## 2021-04-10 MED ORDER — DEXAMETHASONE SODIUM PHOSPHATE 100 MG/10ML IJ SOLN
10.0000 mg | Freq: Once | INTRAMUSCULAR | Status: AC
  Administered 2021-04-10: 10:00:00 10 mg via INTRAVENOUS
  Filled 2021-04-10: qty 10
  Filled 2021-04-10: qty 1

## 2021-04-10 MED ORDER — TRILACICLIB DIHYDROCHLORIDE INJECTION 300 MG
240.0000 mg/m2 | Freq: Once | INTRAVENOUS | Status: AC
  Administered 2021-04-10: 11:00:00 360 mg via INTRAVENOUS
  Filled 2021-04-10: qty 24

## 2021-04-10 MED ORDER — SODIUM CHLORIDE 0.9 % IV SOLN
1500.0000 mg | Freq: Once | INTRAVENOUS | Status: AC
  Administered 2021-04-10: 12:00:00 1500 mg via INTRAVENOUS
  Filled 2021-04-10: qty 30

## 2021-04-10 NOTE — Patient Instructions (Signed)
[facility]   Discharge Instructions: Thank you for choosing [facility] to provide your oncology and hematology care.   If you have a lab appointment with the [facility], please go directly to the [facility] and check in at the registration area.   Wear comfortable clothing and clothing appropriate for easy access to any Portacath or PICC line.   We strive to give you quality time with your provider. You may need to reschedule your appointment if you arrive late (15 or more minutes).  Arriving late affects you and other patients whose appointments are after yours.  Also, if you miss three or more appointments without notifying the office, you may be dismissed from the clinic at the providers discretion.      For prescription refill requests, have your pharmacy contact our office and allow 72 hours for refills to be completed.    Today you received the following chemotherapy and/or immunotherapy agents: durvalumab, carboplatin, and etoposide.      To help prevent nausea and vomiting after your treatment, we encourage you to take your nausea medication as directed.  BELOW ARE SYMPTOMS THAT SHOULD BE REPORTED IMMEDIATELY: *FEVER GREATER THAN 100.4 F (38 C) OR HIGHER *CHILLS OR SWEATING *NAUSEA AND VOMITING THAT IS NOT CONTROLLED WITH YOUR NAUSEA MEDICATION *UNUSUAL SHORTNESS OF BREATH *UNUSUAL BRUISING OR BLEEDING *URINARY PROBLEMS (pain or burning when urinating, or frequent urination) *BOWEL PROBLEMS (unusual diarrhea, constipation, pain near the anus) TENDERNESS IN MOUTH AND THROAT WITH OR WITHOUT PRESENCE OF ULCERS (sore throat, sores in mouth, or a toothache) UNUSUAL RASH, SWELLING OR PAIN  UNUSUAL VAGINAL DISCHARGE OR ITCHING   Items with * indicate a potential emergency and should be followed up as soon as possible or go to the Emergency Department if any problems should occur.  Please show the CHEMOTHERAPY ALERT CARD or  IMMUNOTHERAPY ALERT CARD at check-in to the Emergency Department and triage nurse.  Should you have questions after your visit or need to cancel or reschedule your appointment, please contact [facility]  Dept: [phone]  and follow the prompts.  Office hours are 8:00 a.m. to 4:30 p.m. Monday - Friday. Please note that voicemails left after 4:00 p.m. may not be returned until the following business day.  We are closed weekends and major holidays. You have access to a nurse at all times for urgent questions. Please call the main number to the clinic Dept: [phone] and follow the prompts.   For any non-urgent questions, you may also contact your provider using MyChart. We now offer e-Visits for anyone 14 and older to request care online for non-urgent symptoms. For details visit [email or web address].   Also download the MyChart app! Go to the app store, search "MyChart", open the app, select [facility], and log in with your MyChart username and password.  Due to Covid, a mask is required upon entering the hospital/clinic. If you do not have a mask, one will be given to you upon arrival. For doctor visits, patients may have 1 support person aged 17 or older with them. For treatment visits, patients cannot have anyone with them due to current Covid guidelines and our immunocompromised population.

## 2021-04-10 NOTE — Progress Notes (Signed)
[facility] Telephone:(336) [phone]   Fax:(336) [phone]  OFFICE PROGRESS NOTE  Michela Pitcher, NP [place] [address]  DIAGNOSIS: Extensive stage (T2 a, N2, M1b) small cell lung cancer diagnosed in September 2022 and presented with bulky right hilar mass with mediastinal lymphadenopathy and occlusion of the distal right main bronchus and right lower and middle lobe bronchi by the mass lesion with few subcentimeter brain metastasis.  PRIOR THERAPY: Systemic chemotherapy with cisplatin 80 Mg/M2 on day 1 and etoposide 100 Mg/M2 on days 1, 2 and 3 status post 1 cycle.  This treatment was discontinued after the patient was found to have brain metastasis.  CURRENT THERAPY: Systemic chemotherapy with carboplatin for AUC of 5 on day 1, etoposide 100 Mg/M2 on days 1, 2 and 3 with Cosela before chemotherapy as well as Imfinzi 1500 Mg IV every 3 weeks.  First dose February 20, 2021.  Status post 1 cycle.  INTERVAL HISTORY: Christy Kelley 70 y.o. female returns to the clinic today for follow-up visit accompanied by her husband.  The patient is feeling much better today with no concerning complaints.  She completed her course of radiotherapy.  She denied having any current chest pain, shortness of breath, cough or hemoptysis.  She denied having any fever or chills.  She has no nausea, vomiting, diarrhea or constipation.  She has no headache or visual changes.  She could not receive cycle #2 of her treatment on the schedule because of persistent neutropenia.  Her cycle #3 was delayed by almost 3 weeks now.  She is here today for reevaluation before resuming her treatment.  MEDICAL HISTORY: Past Medical History:  Diagnosis Date   Arthritis    Lung cancer ([place])    Dx 9/27 with lung cancer    ALLERGIES:  has No Known Allergies.  MEDICATIONS:  Current Outpatient Medications  Medication Sig Dispense Refill   albuterol (VENTOLIN HFA) 108 (90 Base) MCG/ACT inhaler  Inhale 2 puffs into the lungs every 6 (six) hours as needed for wheezing or shortness of breath. 1 each 1   Ascorbic Acid (VITAMIN C PO) Take 1,000 mg by mouth daily.     Multiple Vitamin (MULTIVITAMIN) tablet Take 1 tablet by mouth daily.     Vitamin D, Cholecalciferol, 25 MCG (1000 UT) TABS Take 1,000 Units by mouth daily.     MELATONIN PO Take by mouth. (Patient not taking: Reported on 04/10/2021)     meloxicam (MOBIC) 15 MG tablet Take 1 tablet (15 mg total) by mouth daily. (Patient not taking: Reported on 04/10/2021) 30 tablet 2   prochlorperazine (COMPAZINE) 10 MG tablet Take 1 tablet (10 mg total) by mouth every 6 (six) hours as needed for nausea or vomiting. (Patient not taking: Reported on 04/10/2021) 30 tablet 0   temazepam (RESTORIL) 15 MG capsule Take 1 capsule (15 mg total) by mouth at bedtime as needed for sleep. (Patient not taking: Reported on 04/10/2021) 30 capsule 0   No current facility-administered medications for this visit.    SURGICAL HISTORY:  Past Surgical History:  Procedure Laterality Date   BRONCHIAL WASHINGS  01/18/2021   Procedure: BRONCHIAL WASHINGS;  Surgeon: Julian Hy, DO;  [facility];  Service: Endoscopy;;   ENDOBRONCHIAL ULTRASOUND N/A 01/18/2021   Procedure: ENDOBRONCHIAL ULTRASOUND;  Surgeon: Julian Hy, DO;  Location[facility];  Service: Endoscopy;  Laterality: N/A;   FINE NEEDLE ASPIRATION  01/18/2021   Procedure: FINE NEEDLE  ASPIRATION (FNA) LINEAR;  Surgeon: Julian Hy, DO;  Location: [facility];  Service: Endoscopy;;   VIDEO BRONCHOSCOPY N/A 01/18/2021   Procedure: VIDEO BRONCHOSCOPY WITHOUT FLUORO;  Surgeon: Julian Hy, DO;  Location[facility];  Service: Endoscopy;  Laterality: N/A;    REVIEW OF SYSTEMS:  Constitutional: negative Eyes: negative Ears, nose, mouth, throat, and face: negative Respiratory: negative Cardiovascular: negative Gastrointestinal: negative Genitourinary:negative Integument/breast:  negative Hematologic/lymphatic: negative Musculoskeletal:negative Neurological: negative Behavioral/Psych: negative Endocrine: negative Allergic/Immunologic: negative   PHYSICAL EXAMINATION: General appearance: alert, cooperative, fatigued, and no distress Head: Normocephalic, without obvious abnormality, atraumatic Neck: no adenopathy, no JVD, supple, symmetrical, trachea midline, and thyroid not enlarged, symmetric, no tenderness/mass/nodules Lymph nodes: Cervical, supraclavicular, and axillary nodes normal. Resp: clear to auscultation bilaterally Back: symmetric, no curvature. ROM normal. No CVA tenderness. Cardio: regular rate and rhythm, S1, S2 normal, no murmur, click, rub or gallop GI: soft, non-tender; bowel sounds normal; no masses,  no organomegaly Extremities: extremities normal, atraumatic, no cyanosis or edema Neurologic: Alert and oriented X 3, normal strength and tone. Normal symmetric reflexes. Normal coordination and gait  ECOG PERFORMANCE STATUS: 1 - Symptomatic but completely ambulatory  Blood pressure 127/79, pulse 98, temperature (!) 96.1 F (35.6 C), temperature source Tympanic, resp. rate 18, height 5\' 3"  (1.6 m), weight 128 lb (58.1 kg), SpO2 100 %.  LABORATORY DATA: Lab Results  Component Value Date   WBC 4.1 04/10/2021   HGB 11.0 (L) 04/10/2021   HCT 33.1 (L) 04/10/2021   MCV 98.2 04/10/2021   PLT 297 04/10/2021      Chemistry      Component Value Date/Time   NA 142 04/10/2021 0835   K 4.0 04/10/2021 0835   CL 107 04/10/2021 0835   CO2 27 04/10/2021 0835   BUN 22 04/10/2021 0835   CREATININE 0.68 04/10/2021 0835      Component Value Date/Time   CALCIUM 9.3 04/10/2021 0835   ALKPHOS 72 04/10/2021 0835   AST 27 04/10/2021 0835   ALT 15 04/10/2021 0835   BILITOT 0.3 04/10/2021 0835       RADIOGRAPHIC STUDIES: No results found.  ASSESSMENT AND PLAN: This is a very pleasant 70 years old white female recently diagnosed with extensive stage  (T2 a, N2, M1b) small cell lung cancer presented with bulky right hilar mass and mediastinal lymphadenopathy with occlusion of the distal right mainstem bronchus and right lower lobe bronchus as well as new subcentimeter brain metastasis diagnosed in September 2022. The patient started systemic chemotherapy with cisplatin and etoposide last week and tolerated the first week of her treatment fairly well except for increasing fatigue and weakness as well as diarrhea, lack of appetite and weight loss. Unfortunately the staging work-up showed multiple subcentimeter brain metastasis.  The PET scan showed no evidence of metastatic disease outside the previously known areas in the chest. She is here today for evaluation before starting the first cycle of palliative systemic chemotherapy but changing the regimen to carboplatin for AUC of 5 on day 1 and etoposide 100 Mg/M2 on days 1, 2 and 3 with Cosela 240 Mg/M2 before chemotherapy as well as Imfinzi 1500 Mg IV every 3 weeks on day one of the chemotherapy.  Status post 1 cycle. The patient is feeling fine today with no concerning complaints. I recommended for her to proceed with cycle #2 of her regimen today but I will add Neulasta injection 2 days after the treatment starting from cycle #2. I will see the patient back  for follow-up visit in 3 weeks for evaluation before starting cycle #3. She is scheduled to have repeat CT scan of the chest, abdomen pelvis later this week. For the lack of appetite, the patient is currently on Megace ES 625 mg p.o. daily. For the insomnia, I will start the patient on Restoril 15 mg p.o. nightly The patient was advised to call immediately if she has any other concerning symptoms in the interval. The patient voices understanding of current disease status and treatment options and is in agreement with the current care plan.  All questions were answered. The patient knows to call the clinic with any problems, questions or concerns.  We can certainly see the patient much sooner if necessary.  The total time spent in the appointment was 35 minutes.  Disclaimer: This note was dictated with voice recognition software. Similar sounding words can inadvertently be transcribed and may not be corrected upon review.

## 2021-04-11 ENCOUNTER — Inpatient Hospital Stay: Payer: Medicare HMO

## 2021-04-11 VITALS — BP 126/68 | HR 99 | Temp 97.8°F | Resp 16

## 2021-04-11 DIAGNOSIS — Z5111 Encounter for antineoplastic chemotherapy: Secondary | ICD-10-CM | POA: Diagnosis not present

## 2021-04-11 DIAGNOSIS — Z79899 Other long term (current) drug therapy: Secondary | ICD-10-CM | POA: Diagnosis not present

## 2021-04-11 DIAGNOSIS — C7931 Secondary malignant neoplasm of brain: Secondary | ICD-10-CM | POA: Diagnosis not present

## 2021-04-11 DIAGNOSIS — R630 Anorexia: Secondary | ICD-10-CM | POA: Diagnosis not present

## 2021-04-11 DIAGNOSIS — Z923 Personal history of irradiation: Secondary | ICD-10-CM | POA: Diagnosis not present

## 2021-04-11 DIAGNOSIS — C3491 Malignant neoplasm of unspecified part of right bronchus or lung: Secondary | ICD-10-CM

## 2021-04-11 DIAGNOSIS — G4700 Insomnia, unspecified: Secondary | ICD-10-CM | POA: Diagnosis not present

## 2021-04-11 DIAGNOSIS — C3401 Malignant neoplasm of right main bronchus: Secondary | ICD-10-CM | POA: Diagnosis not present

## 2021-04-11 MED ORDER — TRILACICLIB DIHYDROCHLORIDE INJECTION 300 MG
240.0000 mg/m2 | Freq: Once | INTRAVENOUS | Status: AC
  Administered 2021-04-11: 16:00:00 360 mg via INTRAVENOUS
  Filled 2021-04-11: qty 24

## 2021-04-11 MED ORDER — SODIUM CHLORIDE 0.9 % IV SOLN: Freq: Once | INTRAVENOUS | Status: AC

## 2021-04-11 MED ORDER — SODIUM CHLORIDE 0.9 % IV SOLN
100.0000 mg/m2 | Freq: Once | INTRAVENOUS | Status: AC
  Administered 2021-04-11: 16:00:00 150 mg via INTRAVENOUS
  Filled 2021-04-11: qty 7.5

## 2021-04-11 MED ORDER — SODIUM CHLORIDE 0.9 % IV SOLN
10.0000 mg | Freq: Once | INTRAVENOUS | Status: AC
  Administered 2021-04-11: 15:00:00 10 mg via INTRAVENOUS
  Filled 2021-04-11: qty 10

## 2021-04-11 NOTE — Patient Instructions (Signed)
[facility]  Discharge Instructions: Thank you for choosing [facility] to provide your oncology and hematology care.   If you have a lab appointment with the [facility], please go directly to the [facility] and check in at the registration area.   Wear comfortable clothing and clothing appropriate for easy access to any Portacath or PICC line.   We strive to give you quality time with your provider. You may need to reschedule your appointment if you arrive late (15 or more minutes).  Arriving late affects you and other patients whose appointments are after yours.  Also, if you miss three or more appointments without notifying the office, you may be dismissed from the clinic at the providers discretion.      For prescription refill requests, have your pharmacy contact our office and allow 72 hours for refills to be completed.    Today you received the following chemotherapy and/or immunotherapy agents: Cosela and Etoposide      To help prevent nausea and vomiting after your treatment, we encourage you to take your nausea medication as directed.  BELOW ARE SYMPTOMS THAT SHOULD BE REPORTED IMMEDIATELY: *FEVER GREATER THAN 100.4 F (38 C) OR HIGHER *CHILLS OR SWEATING *NAUSEA AND VOMITING THAT IS NOT CONTROLLED WITH YOUR NAUSEA MEDICATION *UNUSUAL SHORTNESS OF BREATH *UNUSUAL BRUISING OR BLEEDING *URINARY PROBLEMS (pain or burning when urinating, or frequent urination) *BOWEL PROBLEMS (unusual diarrhea, constipation, pain near the anus) TENDERNESS IN MOUTH AND THROAT WITH OR WITHOUT PRESENCE OF ULCERS (sore throat, sores in mouth, or a toothache) UNUSUAL RASH, SWELLING OR PAIN  UNUSUAL VAGINAL DISCHARGE OR ITCHING   Items with * indicate a potential emergency and should be followed up as soon as possible or go to the Emergency Department if any problems should occur.  Please show the CHEMOTHERAPY ALERT CARD or IMMUNOTHERAPY ALERT CARD at  check-in to the Emergency Department and triage nurse.  Should you have questions after your visit or need to cancel or reschedule your appointment, please contact [facility]  Dept: [phone]  and follow the prompts.  Office hours are 8:00 a.m. to 4:30 p.m. Monday - Friday. Please note that voicemails left after 4:00 p.m. may not be returned until the following business day.  We are closed weekends and major holidays. You have access to a nurse at all times for urgent questions. Please call the main number to the clinic Dept: [phone] and follow the prompts.   For any non-urgent questions, you may also contact your provider using MyChart. We now offer e-Visits for anyone 68 and older to request care online for non-urgent symptoms. For details visit [email or web address].   Also download the MyChart app! Go to the app store, search "MyChart", open the app, select [facility], and log in with your MyChart username and password.  Due to Covid, a mask is required upon entering the hospital/clinic. If you do not have a mask, one will be given to you upon arrival. For doctor visits, patients may have 1 support person aged 68 or older with them. For treatment visits, patients cannot have anyone with them due to current Covid guidelines and our immunocompromised population.

## 2021-04-12 ENCOUNTER — Other Ambulatory Visit: Payer: Self-pay

## 2021-04-12 ENCOUNTER — Inpatient Hospital Stay: Payer: Medicare HMO

## 2021-04-12 VITALS — BP 135/79 | HR 89 | Temp 97.8°F | Resp 16

## 2021-04-12 DIAGNOSIS — Z5111 Encounter for antineoplastic chemotherapy: Secondary | ICD-10-CM | POA: Diagnosis not present

## 2021-04-12 DIAGNOSIS — R630 Anorexia: Secondary | ICD-10-CM | POA: Diagnosis not present

## 2021-04-12 DIAGNOSIS — G4700 Insomnia, unspecified: Secondary | ICD-10-CM | POA: Diagnosis not present

## 2021-04-12 DIAGNOSIS — Z79899 Other long term (current) drug therapy: Secondary | ICD-10-CM | POA: Diagnosis not present

## 2021-04-12 DIAGNOSIS — Z923 Personal history of irradiation: Secondary | ICD-10-CM | POA: Diagnosis not present

## 2021-04-12 DIAGNOSIS — C3401 Malignant neoplasm of right main bronchus: Secondary | ICD-10-CM | POA: Diagnosis not present

## 2021-04-12 DIAGNOSIS — C3491 Malignant neoplasm of unspecified part of right bronchus or lung: Secondary | ICD-10-CM

## 2021-04-12 DIAGNOSIS — C7931 Secondary malignant neoplasm of brain: Secondary | ICD-10-CM | POA: Diagnosis not present

## 2021-04-12 MED ORDER — SODIUM CHLORIDE 0.9 % IV SOLN
100.0000 mg/m2 | Freq: Once | INTRAVENOUS | Status: AC
  Administered 2021-04-12: 16:00:00 150 mg via INTRAVENOUS
  Filled 2021-04-12: qty 7.5

## 2021-04-12 MED ORDER — TRILACICLIB DIHYDROCHLORIDE INJECTION 300 MG
240.0000 mg/m2 | Freq: Once | INTRAVENOUS | Status: AC
  Administered 2021-04-12: 15:00:00 360 mg via INTRAVENOUS
  Filled 2021-04-12: qty 24

## 2021-04-12 MED ORDER — SODIUM CHLORIDE 0.9 % IV SOLN: Freq: Once | INTRAVENOUS | Status: AC

## 2021-04-12 MED ORDER — SODIUM CHLORIDE 0.9 % IV SOLN
10.0000 mg | Freq: Once | INTRAVENOUS | Status: AC
  Administered 2021-04-12: 15:00:00 10 mg via INTRAVENOUS
  Filled 2021-04-12: qty 10

## 2021-04-12 NOTE — Patient Instructions (Signed)
[facility]  Discharge Instructions: Thank you for choosing [facility] to provide your oncology and hematology care.   If you have a lab appointment with the [facility], please go directly to the [facility] and check in at the registration area.   Wear comfortable clothing and clothing appropriate for easy access to any Portacath or PICC line.   We strive to give you quality time with your provider. You may need to reschedule your appointment if you arrive late (15 or more minutes).  Arriving late affects you and other patients whose appointments are after yours.  Also, if you miss three or more appointments without notifying the office, you may be dismissed from the clinic at the providers discretion.      For prescription refill requests, have your pharmacy contact our office and allow 72 hours for refills to be completed.    Today you received the following chemotherapy and/or immunotherapy agents: Cosela & Etoposide    To help prevent nausea and vomiting after your treatment, we encourage you to take your nausea medication as directed.  BELOW ARE SYMPTOMS THAT SHOULD BE REPORTED IMMEDIATELY: *FEVER GREATER THAN 100.4 F (38 C) OR HIGHER *CHILLS OR SWEATING *NAUSEA AND VOMITING THAT IS NOT CONTROLLED WITH YOUR NAUSEA MEDICATION *UNUSUAL SHORTNESS OF BREATH *UNUSUAL BRUISING OR BLEEDING *URINARY PROBLEMS (pain or burning when urinating, or frequent urination) *BOWEL PROBLEMS (unusual diarrhea, constipation, pain near the anus) TENDERNESS IN MOUTH AND THROAT WITH OR WITHOUT PRESENCE OF ULCERS (sore throat, sores in mouth, or a toothache) UNUSUAL RASH, SWELLING OR PAIN  UNUSUAL VAGINAL DISCHARGE OR ITCHING   Items with * indicate a potential emergency and should be followed up as soon as possible or go to the Emergency Department if any problems should occur.  Please show the CHEMOTHERAPY ALERT CARD or IMMUNOTHERAPY ALERT CARD at  check-in to the Emergency Department and triage nurse.  Should you have questions after your visit or need to cancel or reschedule your appointment, please contact [facility]  Dept: [phone]  and follow the prompts.  Office hours are 8:00 a.m. to 4:30 p.m. Monday - Friday. Please note that voicemails left after 4:00 p.m. may not be returned until the following business day.  We are closed weekends and major holidays. You have access to a nurse at all times for urgent questions. Please call the main number to the clinic Dept: [phone] and follow the prompts.   For any non-urgent questions, you may also contact your provider using MyChart. We now offer e-Visits for anyone 57 and older to request care online for non-urgent symptoms. For details visit [email or web address].   Also download the MyChart app! Go to the app store, search "MyChart", open the app, select [facility], and log in with your MyChart username and password.  Due to Covid, a mask is required upon entering the hospital/clinic. If you do not have a mask, one will be given to you upon arrival. For doctor visits, patients may have 1 support person aged 38 or older with them. For treatment visits, patients cannot have anyone with them due to current Covid guidelines and our immunocompromised population.

## 2021-04-13 ENCOUNTER — Ambulatory Visit: Payer: Medicare HMO

## 2021-04-13 ENCOUNTER — Ambulatory Visit (HOSPITAL_COMMUNITY)
Admission: RE | Admit: 2021-04-13 | Discharge: 2021-04-13 | Disposition: A | Discharge status: Home / Self Care | Payer: Medicare HMO | Source: Ambulatory Visit | Attending: Internal Medicine | Admitting: Internal Medicine

## 2021-04-13 ENCOUNTER — Encounter (HOSPITAL_COMMUNITY): Payer: Self-pay

## 2021-04-13 DIAGNOSIS — G9389 Other specified disorders of brain: Secondary | ICD-10-CM | POA: Diagnosis not present

## 2021-04-13 DIAGNOSIS — C3490 Malignant neoplasm of unspecified part of unspecified bronchus or lung: Secondary | ICD-10-CM | POA: Insufficient documentation

## 2021-04-13 DIAGNOSIS — K7689 Other specified diseases of liver: Secondary | ICD-10-CM | POA: Diagnosis not present

## 2021-04-13 DIAGNOSIS — I700 Atherosclerosis of aorta: Secondary | ICD-10-CM | POA: Diagnosis not present

## 2021-04-13 DIAGNOSIS — J439 Emphysema, unspecified: Secondary | ICD-10-CM | POA: Diagnosis not present

## 2021-04-13 MED ORDER — IOHEXOL 350 MG/ML SOLN
80.0000 mL | Freq: Once | INTRAVENOUS | Status: AC | PRN
  Administered 2021-04-13: 11:00:00 75 mL via INTRAVENOUS

## 2021-04-13 MED ORDER — GADOBUTROL 1 MMOL/ML IV SOLN
6.0000 mL | Freq: Once | INTRAVENOUS | Status: AC | PRN
  Administered 2021-04-13: 08:00:00 6 mL via INTRAVENOUS

## 2021-04-13 MED ORDER — IOHEXOL 9 MG/ML PO SOLN
1000.0000 mL | ORAL | Status: DC
  Administered 2021-04-13: 09:00:00 1000 mL via ORAL

## 2021-04-13 MED ORDER — IOHEXOL 9 MG/ML PO SOLN
ORAL | Status: AC
  Filled 2021-04-13: qty 1000

## 2021-04-13 MED ORDER — SODIUM CHLORIDE (PF) 0.9 % IJ SOLN
INTRAMUSCULAR | Status: AC
  Filled 2021-04-13: qty 50

## 2021-04-14 ENCOUNTER — Other Ambulatory Visit: Payer: Self-pay

## 2021-04-14 ENCOUNTER — Inpatient Hospital Stay: Payer: Medicare HMO

## 2021-04-14 VITALS — BP 121/72 | HR 93 | Temp 98.6°F | Resp 18

## 2021-04-14 DIAGNOSIS — C3491 Malignant neoplasm of unspecified part of right bronchus or lung: Secondary | ICD-10-CM

## 2021-04-14 DIAGNOSIS — R630 Anorexia: Secondary | ICD-10-CM | POA: Diagnosis not present

## 2021-04-14 DIAGNOSIS — Z79899 Other long term (current) drug therapy: Secondary | ICD-10-CM | POA: Diagnosis not present

## 2021-04-14 DIAGNOSIS — Z923 Personal history of irradiation: Secondary | ICD-10-CM | POA: Diagnosis not present

## 2021-04-14 DIAGNOSIS — G4700 Insomnia, unspecified: Secondary | ICD-10-CM | POA: Diagnosis not present

## 2021-04-14 DIAGNOSIS — C7931 Secondary malignant neoplasm of brain: Secondary | ICD-10-CM | POA: Diagnosis not present

## 2021-04-14 DIAGNOSIS — C3401 Malignant neoplasm of right main bronchus: Secondary | ICD-10-CM | POA: Diagnosis not present

## 2021-04-14 DIAGNOSIS — Z5111 Encounter for antineoplastic chemotherapy: Secondary | ICD-10-CM | POA: Diagnosis not present

## 2021-04-14 MED ORDER — PEGFILGRASTIM-CBQV 6 MG/0.6ML ~~LOC~~ SOSY
6.0000 mg | PREFILLED_SYRINGE | Freq: Once | SUBCUTANEOUS | Status: AC
  Administered 2021-04-14: 08:00:00 6 mg via SUBCUTANEOUS

## 2021-04-16 ENCOUNTER — Encounter: Payer: Self-pay | Admitting: Internal Medicine

## 2021-04-24 ENCOUNTER — Telehealth: Payer: Self-pay

## 2021-04-24 NOTE — Chronic Care Management (AMB) (Signed)
Chronic Care Management Pharmacy Assistant   Name: Christy Kelley  MRN: [id number] DOB: 05/19/1950  Christy Kelley is an 71 y.o. year old female who presents for his initial CCM visit with the clinical pharmacist.  Reason for Encounter: Initial Questions   Conditions to be addressed/monitored:CPP to review (COPD per 01/26/21 note)   Recent office visits:  11/23/20-Family Medicine-James Cable,NP-Patient presented for transfer of care.Refilled meloxicam 15mg . take 1 tablet daily for chronic knee pain,labs ordered   Recent consult visits:  04/10/21-Oncology-Mohamed Mohamed,MD-Patient presented for  systemic chemotherapy follow up-will add Neulasta injection 2 days after this treatment. Started Restoril 15mg  take 1 tablet at night,follow up 3 weeks. 03/20/21- Oncology-Mohamed Mohamed,MD-Patient presented for follow up chemotherapy. 02/20/21-Oncology-Mohamed Mohamed,MD-Patient presented for insomnia associated with carcinoma,will start on G-CSF injection  36mcg subcutaneously for 3 days.  02/07/21-Oncology-Mohamed Mohamed,MD-Patient presented for discussion of chemotherapy and radiation therapy . Start medrol dosepak and megace ES 635mg  one tablet daily . 01/26/21-Pulmonology-Nathaniel Meier,MD-Patient presented for follow up hospital stay.Discussed ventilation while starting chemotherapy.Pneumonia shot given,PFT next week if capable,flu and covid shots next week if capable. 01/24/21-Oncology-Mohamed Mohamed,MD-Patient presented for right side chest pain and shortness of breath.Reviewed previous scans, ordered new MRI brain, PET scan,started compazine 10mg  1 tablet every 6 hours as needed for nausea.  Hospital visits:  Medication Reconciliation was completed by comparing discharge summary, patients EMR and Pharmacy list, and upon discussion with patient.  Admitted to the hospital on 01/16/21 due to Acute Respiratory Failure. Discharge date was 01/19/21. Discharged from [facility]?Medications Started at [facility] Discharge:?? -started Albuterol HFA due to respiratory failure with hypoxia    Medications that remain the same after Hospital Discharge:??  -All other medications will remain the same.    Medications: Outpatient Encounter Medications as of 04/24/2021  Medication Sig   albuterol (VENTOLIN HFA) 108 (90 Base) MCG/ACT inhaler Inhale 2 puffs into the lungs every 6 (six) hours as needed for wheezing or shortness of breath.   Ascorbic Acid (VITAMIN C PO) Take 1,000 mg by mouth daily.   MELATONIN PO Take by mouth. (Patient not taking: Reported on 04/10/2021)   meloxicam (MOBIC) 15 MG tablet Take 1 tablet (15 mg total) by mouth daily. (Patient not taking: Reported on 04/10/2021)   Multiple Vitamin (MULTIVITAMIN) tablet Take 1 tablet by mouth daily.   prochlorperazine (COMPAZINE) 10 MG tablet Take 1 tablet (10 mg total) by mouth every 6 (six) hours as needed for nausea or vomiting. (Patient not taking: Reported on 04/10/2021)   temazepam (RESTORIL) 15 MG capsule Take 1 capsule (15 mg total) by mouth at bedtime as needed for sleep. (Patient not taking: Reported on 04/10/2021)   Vitamin D, Cholecalciferol, 25 MCG (1000 UT) TABS Take 1,000 Units by mouth daily.   No facility-administered encounter medications on file as of 04/24/2021.   Voice is noted to be hoarse. Lab Results  Component Value Date/Time   HGBA1C 5.5 01/17/2021 01:49 AM     BP Readings from Last 3 Encounters:  04/14/21 121/72  04/12/21 135/79  04/11/21 126/68    Patient contacted to review initial questions prior to visit with Charlene Brooke. Spoke with husband Jeneen Rinks  Have you seen any other providers since your last visit with PCP? Yes Oncology  Any changes in your medications or health?  Patient getting oncology treatments  Any side effects from any medications? No  Do you have an symptoms or problems not managed by your medications? No  Any concerns about  your health right now?  No  Has your provider asked that you check blood pressure, blood sugar, or follow special diet at home? No  Do you get any type of exercise on a regular basis? No  Can you think of a goal you would like to reach for your health? No  Do you have any problems getting your medications? No   Is there anything that you would like to discuss during the appointment? No   Spoke with patient and reminded them to have all medications, supplements and any blood glucose and blood pressure readings available for review with pharmacist, at their telephone visit on 04/26/20 at 11:00am.   Confirmed with Husband, Jeneen Rinks.  Star Rating Drugs:  Medication:  Last Fill: Day Supply No star medications identified  Care Gaps: Annual wellness visit in last year? No Most Recent BP reading:127/79  98-P  04/10/21   [facility] CPP notified  Avel Sensor, [place] Assistant [phone]  Total time spent for month CPA: 40 min.

## 2021-04-26 ENCOUNTER — Other Ambulatory Visit: Payer: Self-pay

## 2021-04-26 ENCOUNTER — Ambulatory Visit: Payer: Medicare HMO | Admitting: Pharmacist

## 2021-04-26 DIAGNOSIS — C3491 Malignant neoplasm of unspecified part of right bronchus or lung: Secondary | ICD-10-CM

## 2021-04-26 DIAGNOSIS — M25562 Pain in left knee: Secondary | ICD-10-CM

## 2021-04-26 DIAGNOSIS — G4701 Insomnia due to medical condition: Secondary | ICD-10-CM

## 2021-04-26 DIAGNOSIS — G8929 Other chronic pain: Secondary | ICD-10-CM

## 2021-04-26 NOTE — Progress Notes (Signed)
Chronic Care Management Pharmacy Note  04/26/2021 Name:  Christy Kelley MRN:  [id number] DOB:  03/31/1951  Summary: -Pt is taking only meloxicam, temazepam PRN and vitamins currently while receiving chemotherapy, no interactions identified -Pt reports poor sleep, she says temazepam is not helpful; however she declines trial of alternative sleep therapies saying "I will get over it soon"  Recommendations/Changes made from today's visit: -No changes -Pt has decided to opt out of CCM program for time being  Follow up Plan: The patient has been provided with contact information for the care management team and has been advised to call with any health related questions or concerns.    Subjective: Christy Kelley is an 71 y.o. year old female who is a primary patient of Michela Pitcher, NP.  The CCM team was consulted for assistance with disease management and care coordination needs.    Engaged with patient by telephone for initial visit in response to provider referral for pharmacy case management and/or care coordination services.   Consent to Services:  The patient was given the following information about Chronic Care Management services today, agreed to services, and gave verbal consent: 1. CCM service includes personalized support from designated clinical staff supervised by the primary care provider, including individualized plan of care and coordination with other care providers 2. 24/7 contact phone numbers for assistance for urgent and routine care needs. 3. Service will only be billed when office clinical staff spend 20 minutes or more in a month to coordinate care. 4. Only one practitioner may furnish and bill the service in a calendar month. 5.The patient may stop CCM services at any time (effective at the end of the month) by phone call to the office staff. 6. The patient will be responsible for cost sharing (co-pay) of up to 20% of the service fee (after annual deductible is met).  Patient agreed to services and consent obtained.  Patient Care Team: Michela Pitcher, NP as PCP - General (Pain Medicine) Valrie Hart, RN as Oncology Nurse Navigator (Oncology) Charlton Haws, [facility] as Pharmacist (Pharmacist)  Recent office visits: 11/23/20-Family Medicine-James Cable,NP-Patient presented for transfer of care.Refilled meloxicam 57m for knee pain. Referred for colonoscopy. Declined mammogram. Referred to cardiology for eval for murmur.  Recent consult visits: 04/10/21-Oncology-Mohamed Mohamed,MD-Patient presented for  systemic chemotherapy follow up-will add Neulasta injection 2 days after this treatment. Started Restoril 118mtake 1 tablet at night,follow up 3 weeks.  03/20/21- Oncology-Mohamed Mohamed,MD-Patient presented for follow up chemotherapy.  02/20/21-Oncology-Mohamed Mohamed,MD-Patient presented for insomnia associated with carcinoma,will start on G-CSF injection  30041msubcutaneously for 3 days.   02/07/21-Oncology-Mohamed Mohamed,MD-Patient presented for discussion of chemotherapy and radiation therapy . Start medrol dosepak and megace ES 635m69me tablet daily .  01/26/21-Pulmonology-Nathaniel Meier,MD-Patient presented for follow up hospital stay.Discussed ventilation while starting chemotherapy.Pneumonia shot given,PFT next week if capable,flu and covid shots next week if capable.  01/24/21-Oncology-Mohamed Mohamed,MD- Initial SCLC eval. Dx Stage 2. Reviewed previous scans, ordered new MRI brain, PET scan,started compazine 10mg65mablet every 6 hours as needed for nausea.  Hospital visits: Medication Reconciliation was completed by comparing discharge summary, patients EMR and Pharmacy list, and upon discussion with patient.   Admitted to the hospital on 01/16/21 due to Acute Respiratory Failure. Discharge date was 01/19/21. [place]. Bronchoscopy 9/29. F/u Oncology   [facility] -started Albuterol HFA due to respiratory failure with hypoxia    Medications  that remain the same after Hospital Discharge:??  -All other medications will remain the same.     Objective:  Lab Results  Component Value Date   CREATININE 0.68 04/10/2021   BUN 22 04/10/2021   GFR 87.82 11/24/2020   GFRNONAA >60 04/10/2021   NA 142 04/10/2021   K 4.0 04/10/2021   CALCIUM 9.3 04/10/2021   CO2 27 04/10/2021   GLUCOSE 90 04/10/2021    Lab Results  Component Value Date/Time   HGBA1C 5.5 01/17/2021 01:49 AM   GFR 87.82 11/24/2020 09:55 AM   GFR 100.10 12/23/2018 10:05 AM    Last diabetic Eye exam: No results found for: HMDIABEYEEXA  Last diabetic Foot exam: No results found for: HMDIABFOOTEX   Lab Results  Component Value Date   CHOL 192 11/24/2020   HDL 58.30 11/24/2020   LDLCALC 76 12/23/2018   LDLDIRECT 55.0 11/24/2020   TRIG 246.0 (H) 11/24/2020   CHOLHDL 3 11/24/2020    Hepatic Function Latest Ref Rng & Units 04/10/2021 03/27/2021 03/20/2021  Total Protein 6.5 - 8.1 g/dL 6.3(L) 6.6 6.6  Albumin 3.5 - 5.0 g/dL 4.0 3.9 4.1  AST 15 - 41 U/L 27 32 31  ALT 0 - 44 U/L '15 19 18  ' Alk Phosphatase 38 - 126 U/L 72 79 71  Total Bilirubin 0.3 - 1.2 mg/dL 0.3 0.3 0.4    Lab Results  Component Value Date/Time   TSH 1.082 04/10/2021 08:35 AM   TSH 1.345 03/20/2021 07:29 AM   TSH 1.13 11/24/2020 09:55 AM   TSH 0.98 12/23/2018 10:05 AM    CBC Latest Ref Rng & Units 04/10/2021 03/27/2021 03/20/2021  WBC 4.0 - 10.5 K/uL 4.1 4.1 2.1(L)  Hemoglobin 12.0 - 15.0 g/dL 11.0(L) 10.8(L) 10.6(L)  Hematocrit 36.0 - 46.0 % 33.1(L) 33.3(L) 32.1(L)  Platelets 150 - 400 K/uL 297 312 361    Lab Results  Component Value Date/Time   VD25OH 87.40 11/24/2020 09:55 AM    Clinical ASCVD: No  The 10-year ASCVD risk score (Arnett DK, et al., 2019) is: 6.7%   Values used to calculate the score:     Age: 19 years     Sex: Female     Is Non-Hispanic African American: No      Diabetic: No     Tobacco smoker: No     Systolic Blood Pressure: 003 mmHg     Is BP treated: No     HDL Cholesterol: 58.3 mg/dL     Total Cholesterol: 192 mg/dL    Depression screen [facility] 2/9 11/23/2020 12/23/2018  Decreased Interest 0 0  Down, Depressed, Hopeless 0 0  PHQ - 2 Score 0 0  Altered sleeping - 0  Tired, decreased energy - 0  Change in appetite - 0  Feeling bad or failure about yourself  - 0  Trouble concentrating - 0  Moving slowly or fidgety/restless - 0  Suicidal thoughts - 0  PHQ-9 Score - 0  Difficult doing work/chores - Not difficult at all     Social History   Tobacco Use  Smoking Status Former   Packs/day: 0.25   Years: 35.00   Pack years: 8.75   Types: Cigarettes   Quit date: 08/29/2020   Years since quitting: 0.6  Smokeless Tobacco Never   BP Readings from Last 3 Encounters:  04/14/21 121/72  04/12/21 135/79  04/11/21 126/68   Pulse Readings from Last 3 Encounters:  04/14/21 93  04/12/21 89  04/11/21 99   Wt Readings from  Last 3 Encounters:  04/10/21 128 lb (58.1 kg)  03/20/21 125 lb 12.8 oz (57.1 kg)  02/27/21 121 lb (54.9 kg)   BMI Readings from Last 3 Encounters:  04/10/21 22.67 kg/m  03/20/21 22.28 kg/m  02/27/21 21.43 kg/m    Assessment/Interventions: Review of patient past medical history, allergies, medications, health status, including review of consultants reports, laboratory and other test data, was performed as part of comprehensive evaluation and provision of chronic care management services.   SDOH:  (Social Determinants of Health) assessments and interventions performed: Yes  SDOH Screenings   Alcohol Screen: Not on file  Depression (PHQ2-9): Low Risk    PHQ-2 Score: 0  Financial Resource Strain: Not on file  Food Insecurity: Not on file  Housing: Not on file  Physical Activity: Not on file  Social Connections: Not on file  Stress: Not on file  Tobacco Use: Medium Risk   Smoking Tobacco Use: Former   Smokeless  Tobacco Use: Never   Passive Exposure: Not on file  Transportation Needs: Not on file    [place]  No Known Allergies  Medications Reviewed Today     Reviewed by Charlton Haws, [place] (Pharmacist) on 04/26/21 at 1120  Med List Status: <None>   Medication Order Taking? Sig Documenting Provider Last Dose Status Informant  Ascorbic Acid (VITAMIN C PO) [id number] Yes Take 1,000 mg by mouth daily. [provider] Taking Active Spouse/Significant Other  calcium carbonate (OSCAL) 1500 (600 Ca) MG TABS tablet [id number] Yes Take by mouth daily with breakfast. [provider] Taking Active   meloxicam (MOBIC) 15 MG tablet [id number] Yes Take 1 tablet (15 mg total) by mouth daily. Michela Pitcher, NP Taking Active   Multiple Vitamin (MULTIVITAMIN) tablet [id number] Yes Take 1 tablet by mouth daily. [provider] Taking Active Spouse/Significant Other  OVER THE COUNTER MEDICATION [id number] Yes Balance of Nature supplement [provider] Taking Active   prochlorperazine (COMPAZINE) 10 MG tablet [id number] No Take 1 tablet (10 mg total) by mouth every 6 (six) hours as needed for nausea or vomiting.  Patient not taking: Reported on 04/26/2021   Curt Bears, MD Not Taking Active   temazepam (RESTORIL) 15 MG capsule [id number] Yes Take 1 capsule (15 mg total) by mouth at bedtime as needed for sleep. Curt Bears, MD Taking Active   Vitamin D, Cholecalciferol, 25 MCG (1000 UT) TABS [id number] Yes Take 1,000 Units by mouth daily. [provider] Taking Active Spouse/Significant Other            Patient Active Problem List   Diagnosis Date Noted   Chemotherapy induced neutropenia ([place]) 02/20/2021   SCLC (small cell lung carcinoma), right ([place]) 02/07/2021   Encounter for antineoplastic immunotherapy 02/07/2021   Small cell carcinoma of right lung ([place]) 01/24/2021   Encounter for antineoplastic chemotherapy 01/24/2021   Acute respiratory  failure with hypoxia ([place]) 01/17/2021   Lung mass 01/17/2021   Hyponatremia 01/17/2021   Elevated troponin 01/17/2021   Hyperglycemia 01/17/2021   Brain fog 11/23/2020   Murmur, cardiac 11/23/2020   Breast density 11/23/2020   CAP (community acquired pneumonia) 08/02/2020   Chronic pain of left knee 12/23/2018   Family history of rheumatoid arthritis 12/23/2018    Immunization History  Administered Date(s) Administered   Influenza-Unspecified 01/29/2021   PFIZER(Purple Top)SARS-COV-2 Vaccination 05/27/2019, 06/21/2019, 02/09/2020   PNEUMOCOCCAL CONJUGATE-20 01/26/2021   Pfizer Covid-19 Vaccine Bivalent Booster 102yr & up 01/29/2021    Conditions to be addressed/monitored:  Lung  cancer, Insomnia, Knee pain  Care Plan : [place]  Updates made by Charlton Haws, RPH since 04/26/2021 12:00 AM     Problem: Lung cancer, Insomnia, Knee pain   Priority: High     Goal: Disease mgmt   Start Date: 04/26/2021  This Visit's Progress: On track  Priority: High  Note:   Current Barriers:  Unable to independently monitor therapeutic efficacy  Pharmacist Clinical Goal(s):  Patient will achieve adherence to monitoring guidelines and medication adherence to achieve therapeutic efficacy through collaboration with PharmD and provider.   Interventions: 1:1 collaboration with Michela Pitcher, NP regarding development and update of comprehensive plan of care as evidenced by provider attestation and co-signature Inter-disciplinary care team collaboration (see longitudinal plan of care) Comprehensive medication review performed; medication list updated in electronic medical record  [place] cancer (Goal: manage symptoms, chemotherapy side effects) -Controlled -pt undergoing active chemotherapy and follows with Dr Earlie Server regularly -Dx [facility] 12/2020. Systemic chemo with cisplatin d/c'd when brain metastasis discovered. Started new chemo regimen 02/20/21. Cycles have been delayed due to  persistent neutropenia. -Current treatment  Prochlorperazine 10 mg PRN - has not needed -Recommended to continue current medication  Insomnia (Goal: improve sleep) -Not ideally controlled - pt reports she is not sleeping well during chemotherapy; she is not taking melatonin and reports temazepam is not helpful; she is not interesting in alternative therapies at this time saying "I will get over it soon" -Current treatment  Temazepam 15 mg HS - once in a while Melatonin - not using -Offered trial of alternative sleep medications (trazodone, mirtazapine), pt declined  Pain (Goal: manage pain) -Controlled - per pt report -Kidney function, BP appropriate for continued NSAID use -Current treatment  Meloxicam 15 mg daily -Recommended to continue current medication  Health Maintenance -Vaccine gaps: Shingrix, TDAP -Current therapy:  Vitamin C Albuterol HFA prn - not using Multivitamin Vitamin D 1000 IU daily Calcium Balance of nature -Patient is satisfied with current therapy and denies issues -Advised pt to inform oncologist if she starts any new supplements  Patient Goals/Self-Care Activities Patient will:  - take medications as prescribed as evidenced by patient report and record review       Medication Assistance: None required.  Patient affirms current coverage meets needs.  Compliance/Adherence/Medication fill history: Care Gaps: Hep C screening Colonoscopy Mammogram (due 10/11/09) DEXA scan  Star-Rating Drugs: None  Patient's preferred pharmacy is:  [id number] [place] [id number]: [phone]: [phone] Uses pill box? No -   Pt endorses 100% compliance  We discussed: Current pharmacy is preferred with insurance plan and patient is satisfied with pharmacy services Patient decided to: Continue current medication management strategy  Care Plan and  Follow Up Patient Decision:  Patient requests no follow-up at this time.  Plan: The patient has been provided with contact information for the care management team and has been advised to call with any health related questions or concerns.   LCharlene Brooke PharmD, BCACP Clinical Pharmacist [phone]

## 2021-04-26 NOTE — Patient Instructions (Signed)
Visit Information  Phone number for Pharmacist: [phone]  Thank you for meeting with me to discuss your medications! I look forward to working with you to achieve your health care goals. Below is a summary of what we talked about during the visit:   Goals Addressed   None     Care Plan : [place]  Updates made by Charlton Haws, RPH since 04/26/2021 12:00 AM     Problem: Lung cancer, Insomnia, Knee pain   Priority: High     Goal: Disease mgmt   Start Date: 04/26/2021  This Visit's Progress: On track  Priority: High  Note:   Current Barriers:  Unable to independently monitor therapeutic efficacy  Pharmacist Clinical Goal(s):  Patient will achieve adherence to monitoring guidelines and medication adherence to achieve therapeutic efficacy through collaboration with PharmD and provider.   Interventions: 1:1 collaboration with Michela Pitcher, NP regarding development and update of comprehensive plan of care as evidenced by provider attestation and co-signature Inter-disciplinary care team collaboration (see longitudinal plan of care) Comprehensive medication review performed; medication list updated in electronic medical record  [place] cancer (Goal: manage symptoms, chemotherapy side effects) -Controlled -pt undergoing active chemotherapy and follows with Dr Earlie Server regularly -Dx [facility] 12/2020. Systemic chemo with cisplatin d/c'd when brain metastasis discovered. Started new chemo regimen 02/20/21. Cycles have been delayed due to persistent neutropenia. -Current treatment  Prochlorperazine 10 mg PRN - has not needed -Recommended to continue current medication  Insomnia (Goal: improve sleep) -Not ideally controlled - pt reports she is not sleeping well during chemotherapy; she is not taking melatonin and reports temazepam is not helpful; she is not interesting in alternative therapies at this time saying "I will get over it soon" -Current treatment  Temazepam 15  mg HS - once in a while Melatonin - not using -Offered trial of alternative sleep medications (trazodone, mirtazapine), pt declined  Pain (Goal: manage pain) -Controlled - per pt report -Kidney function, BP appropriate for continued NSAID use -Current treatment  Meloxicam 15 mg daily -Recommended to continue current medication  Health Maintenance -Vaccine gaps: Shingrix, TDAP -Current therapy:  Vitamin C Albuterol HFA prn - not using Multivitamin Vitamin D 1000 IU daily Calcium Balance of nature -Patient is satisfied with current therapy and denies issues -Advised pt to inform oncologist if she starts any new supplements  Patient Goals/Self-Care Activities Patient will:  - take medications as prescribed as evidenced by patient report and record review       Ms. Kessenich was given information about Chronic Care Management services today including:  CCM service includes personalized support from designated clinical staff supervised by her physician, including individualized plan of care and coordination with other care providers 24/7 contact phone numbers for assistance for urgent and routine care needs. Standard insurance, coinsurance, copays and deductibles apply for chronic care management only during months in which we provide at least 20 minutes of these services. Most insurances cover these services at 100%, however patients may be responsible for any copay, coinsurance and/or deductible if applicable. This service may help you avoid the need for more expensive face-to-face services. Only one practitioner may furnish and bill the service in a calendar month. The patient may stop CCM services at any time (effective at the end of the month) by phone call to the office staff.  Patient agreed to services and verbal consent obtained.   Patient verbalizes understanding of instructions provided today and agrees to view in [place].  CCM  enrollment status changed to "previously  enrolled" as per patient request on 04/26/21 to discontinue enrollment. Case closed to case management services in primary care home.   Charlene Brooke, PharmD, BCACP Clinical Pharmacist [facility] at [facility] [phone]

## 2021-04-27 ENCOUNTER — Encounter: Payer: Self-pay | Admitting: Internal Medicine

## 2021-04-30 ENCOUNTER — Ambulatory Visit: Payer: Medicare HMO | Admitting: Student

## 2021-05-01 ENCOUNTER — Telehealth: Payer: Medicare HMO | Admitting: Physician Assistant

## 2021-05-01 ENCOUNTER — Telehealth: Payer: Self-pay | Admitting: Physician Assistant

## 2021-05-01 ENCOUNTER — Inpatient Hospital Stay: Payer: Medicare HMO

## 2021-05-01 ENCOUNTER — Inpatient Hospital Stay: Payer: Medicare HMO | Admitting: Physician Assistant

## 2021-05-01 DIAGNOSIS — U071 COVID-19: Secondary | ICD-10-CM | POA: Diagnosis not present

## 2021-05-01 MED ORDER — BENZONATATE 100 MG PO CAPS: 100.0000 mg | ORAL_CAPSULE | Freq: Three times a day (TID) | ORAL | 0 refills | Status: DC | PRN

## 2021-05-01 MED ORDER — NIRMATRELVIR/RITONAVIR (PAXLOVID)TABLET: 3.0000 | ORAL_TABLET | Freq: Two times a day (BID) | ORAL | 0 refills | Status: AC

## 2021-05-01 NOTE — Progress Notes (Signed)
Virtual Visit Consent   Christy Kelley, you are scheduled for a virtual visit with a [facility] provider today.     Just as with appointments in the office, your consent must be obtained to participate.  Your consent will be active for this visit and any virtual visit you may have with one of our providers in the next 365 days.     If you have a MyChart account, a copy of this consent can be sent to you electronically.  All virtual visits are billed to your insurance company just like a traditional visit in the office.    As this is a virtual visit, video technology does not allow for your provider to perform a traditional examination.  This may limit your provider's ability to fully assess your condition.  If your provider identifies any concerns that need to be evaluated in person or the need to arrange testing (such as labs, EKG, etc.), we will make arrangements to do so.     Although advances in technology are sophisticated, we cannot ensure that it will always work on either your end or our end.  If the connection with a video visit is poor, the visit may have to be switched to a telephone visit.  With either a video or telephone visit, we are not always able to ensure that we have a secure connection.     I need to obtain your verbal consent now.   Are you willing to proceed with your visit today?    [name] has provided verbal consent on 05/01/2021 for a virtual visit (video or telephone).   Christy Kelley, [place]   Date: 05/01/2021 11:47 AM   Virtual Visit via Video Note   I, Christy Kelley, connected with  Tinie Mcgloin  ([id number], 1950/06/25) on 05/01/21 at 11:30 AM EST by a video-enabled telemedicine application and verified that I am speaking with the correct person using two identifiers.  Location: Patient: Virtual Visit Location Patient: Home Provider: Virtual Visit Location Provider: Home Office   I discussed the limitations of evaluation and management by  telemedicine and the availability of in person appointments. The patient expressed understanding and agreed to proceed.    History of Present Illness: Christy Kelley is a 71 y.o. who identifies as a female who was assigned female at birth, and is being seen today for COVID-19. Notes symptoms starting today with coughing, fatigue. Denies chest pain or SOB. Denies nausea or diarrhea. Has not taken anything for symptoms.  She is currently being treated with SCLC and is on chemotherapy with chemotherapy-induced neutropenia.   Temperature -- 99.5 BP -- 125/79 HR - 110 bpm.  HPI: HPI  Problems:  Patient Active Problem List   Diagnosis Date Noted   Chemotherapy induced neutropenia ([place]) 02/20/2021   SCLC (small cell lung carcinoma), right ([place]) 02/07/2021   Encounter for antineoplastic immunotherapy 02/07/2021   Small cell carcinoma of right lung ([place]) 01/24/2021   Encounter for antineoplastic chemotherapy 01/24/2021   Acute respiratory failure with hypoxia ([place]) 01/17/2021   Lung mass 01/17/2021   Hyponatremia 01/17/2021   Elevated troponin 01/17/2021   Hyperglycemia 01/17/2021   Brain fog 11/23/2020   Murmur, cardiac 11/23/2020   Breast density 11/23/2020   CAP (community acquired pneumonia) 08/02/2020   Chronic pain of left knee 12/23/2018   Family history of rheumatoid arthritis 12/23/2018    Allergies: No Known Allergies Medications:  Current Outpatient Medications:    benzonatate (TESSALON) 100 MG capsule, Take 1  capsule (100 mg total) by mouth 3 (three) times daily as needed for cough., Disp: 30 capsule, Rfl: 0   nirmatrelvir/ritonavir EUA (PAXLOVID) 20 x 150 MG & 10 x 100MG  TABS, Take 3 tablets by mouth 2 (two) times daily for 5 days. (Take nirmatrelvir 150 mg two tablets twice daily for 5 days and ritonavir 100 mg one tablet twice daily for 5 days) Patient GFR is > 60, Disp: 30 tablet, Rfl: 0   Ascorbic Acid (VITAMIN C PO), Take 1,000 mg by mouth daily., Disp: , Rfl:    calcium  carbonate (OSCAL) 1500 (600 Ca) MG TABS tablet, Take by mouth daily with breakfast., Disp: , Rfl:    meloxicam (MOBIC) 15 MG tablet, Take 1 tablet (15 mg total) by mouth daily., Disp: 30 tablet, Rfl: 2   Multiple Vitamin (MULTIVITAMIN) tablet, Take 1 tablet by mouth daily., Disp: , Rfl:    OVER THE COUNTER MEDICATION, Balance of Nature supplement, Disp: , Rfl:    temazepam (RESTORIL) 15 MG capsule, Take 1 capsule (15 mg total) by mouth at bedtime as needed for sleep., Disp: 30 capsule, Rfl: 0   Vitamin D, Cholecalciferol, 25 MCG (1000 UT) TABS, Take 1,000 Units by mouth daily., Disp: , Rfl:   Observations/Objective: Patient is well-developed, well-nourished in no acute distress.  Resting comfortably at home.  Head is normocephalic, atraumatic.  No labored breathing Speech is clear and coherent with logical content.  Patient is alert and oriented at baseline.   Assessment and Plan: 1. COVID-19 - benzonatate (TESSALON) 100 MG capsule; Take 1 capsule (100 mg total) by mouth 3 (three) times daily as needed for cough.  Dispense: 30 capsule; Refill: 0 - nirmatrelvir/ritonavir EUA (PAXLOVID) 20 x 150 MG & 10 x 100MG  TABS; Take 3 tablets by mouth 2 (two) times daily for 5 days. (Take nirmatrelvir 150 mg two tablets twice daily for 5 days and ritonavir 100 mg one tablet twice daily for 5 days) Patient GFR is > 60  Dispense: 30 tablet; Refill: 0  Patient with multiple risk factors for complicated course of illness. Discussed risks/benefits of antiviral medications including most common potential ADRs. Patient voiced understanding and would like to proceed with antiviral medication. They are candidate for Paxlovid. Recent GFR > 60. Rx sent to pharmacy. Supportive measures, OTC medications and vitamin regimen reviewed. Tessalon per orders. Patient has been enrolled in a MyChart COVID symptom monitoring program. [name] reviewed in detail. Very strict ER precautions discussed with patient and husband.     Follow Up Instructions: I discussed the assessment and treatment plan with the patient. The patient was provided an opportunity to ask questions and all were answered. The patient agreed with the plan and demonstrated an understanding of the instructions.  A copy of instructions were sent to the patient via MyChart unless otherwise noted below.   The patient was advised to call back or seek an in-person evaluation if the symptoms worsen or if the condition fails to improve as anticipated.  Time:  I spent 15 minutes with the patient via telehealth technology discussing the above problems/concerns.    Christy Rio, PA-C

## 2021-05-01 NOTE — Patient Instructions (Signed)
Malachy Moan, thank you for joining [name], PA-C for today's virtual visit.  While this provider is not your primary care provider (PCP), if your PCP is located in our provider database this encounter information will be shared with them immediately following your visit.  Consent: (Patient) Christy Kelley provided verbal consent for this virtual visit at the beginning of the encounter.  Current Medications:  Current Outpatient Medications:    Ascorbic Acid (VITAMIN C PO), Take 1,000 mg by mouth daily., Disp: , Rfl:    calcium carbonate (OSCAL) 1500 (600 Ca) MG TABS tablet, Take by mouth daily with breakfast., Disp: , Rfl:    meloxicam (MOBIC) 15 MG tablet, Take 1 tablet (15 mg total) by mouth daily., Disp: 30 tablet, Rfl: 2   Multiple Vitamin (MULTIVITAMIN) tablet, Take 1 tablet by mouth daily., Disp: , Rfl:    OVER THE COUNTER MEDICATION, Balance of Nature supplement, Disp: , Rfl:    prochlorperazine (COMPAZINE) 10 MG tablet, Take 1 tablet (10 mg total) by mouth every 6 (six) hours as needed for nausea or vomiting. (Patient not taking: Reported on 04/26/2021), Disp: 30 tablet, Rfl: 0   temazepam (RESTORIL) 15 MG capsule, Take 1 capsule (15 mg total) by mouth at bedtime as needed for sleep., Disp: 30 capsule, Rfl: 0   Vitamin D, Cholecalciferol, 25 MCG (1000 UT) TABS, Take 1,000 Units by mouth daily., Disp: , Rfl:    Medications ordered in this encounter:  No orders of the defined types were placed in this encounter.    *If you need refills on other medications prior to your next appointment, please contact your pharmacy*  Follow-Up: Call back or seek an in-person evaluation if the symptoms worsen or if the condition fails to improve as anticipated.  Other Instructions Please keep well-hydrated and get plenty of rest. Start a saline nasal rinse to flush out your nasal passages. You can use plain Mucinex to help thin congestion. If you have a humidifier, running in the  bedroom at night. I want you to start OTC vitamin D3 1000 units daily, vitamin C 1000 mg daily, and a zinc supplement. Please take prescribed medications as directed.  You have been enrolled in a MyChart symptom monitoring program. Please answer these questions daily so we can keep track of how you are doing.  You were to quarantine for 5 days from onset of your symptoms.  After day 5, if you have had no fever and you are feeling better, you can end quarantine but need to mask for an additional 5 days. After day 5 if you have a fever or are having significant symptoms, please quarantine for full 10 days.  If you note any worsening of symptoms, any significant shortness of breath or any chest pain, please seek ER evaluation ASAP.  Please do not delay care!  COVID-19: What to Do if You Are Sick If you test positive and are an older adult or someone who is at high risk of getting very sick from COVID-19, treatment may be available. Contact a healthcare provider right away after a positive test to determine if you are eligible, even if your symptoms are mild right now. You can also visit a Test to Treat location and, if eligible, receive a prescription from a provider. Don't delay: Treatment must be started within the first few days to be effective. If you have a fever, cough, or other symptoms, you might have COVID-19. Most people have mild illness and are able to recover  at home. If you are sick: Keep track of your symptoms. If you have an emergency warning sign (including trouble breathing), call 911. Steps to help prevent the spread of COVID-19 if you are sick If you are sick with COVID-19 or think you might have COVID-19, follow the steps below to care for yourself and to help protect other people in your home and community. Stay home except to get medical care Stay home. Most people with COVID-19 have mild illness and can recover at home without medical care. Do not leave your home, except to  get medical care. Do not visit public areas and do not go to places where you are unable to wear a mask. Take care of yourself. Get rest and stay hydrated. Take over-the-counter medicines, such as acetaminophen, to help you feel better. Stay in touch with your doctor. Call before you get medical care. Be sure to get care if you have trouble breathing, or have any other emergency warning signs, or if you think it is an emergency. Avoid public transportation, ride-sharing, or taxis if possible. Get tested If you have symptoms of COVID-19, get tested. While waiting for test results, stay away from others, including staying apart from those living in your household. Get tested as soon as possible after your symptoms start. Treatments may be available for people with COVID-19 who are at risk for becoming very sick. Don't delay: Treatment must be started early to be effective--some treatments must begin within 5 days of your first symptoms. Contact your healthcare provider right away if your test result is positive to determine if you are eligible. Self-tests are one of several options for testing for the virus that causes COVID-19 and may be more convenient than laboratory-based tests and point-of-care tests. Ask your healthcare provider or your local health department if you need help interpreting your test results. You can visit your state, tribal, local, and territorial health department's website to look for the latest local information on testing sites. Separate yourself from other people As much as possible, stay in a specific room and away from other people and pets in your home. If possible, you should use a separate bathroom. If you need to be around other people or animals in or outside of the home, wear a well-fitting mask. Tell your close contacts that they may have been exposed to COVID-19. An infected person can spread COVID-19 starting 48 hours (or 2 days) before the person has any symptoms or  tests positive. By letting your close contacts know they may have been exposed to COVID-19, you are helping to protect everyone. See COVID-19 and Animals if you have questions about pets. If you are diagnosed with COVID-19, someone from the health department may call you. Answer the call to slow the spread. Monitor your symptoms Symptoms of COVID-19 include fever, cough, or other symptoms. Follow care instructions from your healthcare provider and local health department. Your local health authorities may give instructions on checking your symptoms and reporting information. When to seek emergency medical attention Look for emergency warning signs* for COVID-19. If someone is showing any of these signs, seek emergency medical care immediately: Trouble breathing Persistent pain or pressure in the chest New confusion Inability to wake or stay awake Pale, gray, or blue-colored skin, lips, or nail beds, depending on skin tone *This list is not all possible symptoms. Please call your medical provider for any other symptoms that are severe or concerning to you. Call 911 or call ahead to your  local emergency facility: Notify the operator that you are seeking care for someone who has or may have COVID-19. Call ahead before visiting your doctor Call ahead. Many medical visits for routine care are being postponed or done by phone or telemedicine. If you have a medical appointment that cannot be postponed, call your doctor's office, and tell them you have or may have COVID-19. This will help the office protect themselves and other patients. If you are sick, wear a well-fitting mask You should wear a mask if you must be around other people or animals, including pets (even at home). Wear a mask with the best fit, protection, and comfort for you. You don't need to wear the mask if you are alone. If you can't put on a mask (because of trouble breathing, for example), cover your coughs and sneezes in some other  way. Try to stay at least 6 feet away from other people. This will help protect the people around you. Masks should not be placed on young children under age 9 years, anyone who has trouble breathing, or anyone who is not able to remove the mask without help. Cover your coughs and sneezes Cover your mouth and nose with a tissue when you cough or sneeze. Throw away used tissues in a lined trash can. Immediately wash your hands with soap and water for at least 20 seconds. If soap and water are not available, clean your hands with an alcohol-based hand sanitizer that contains at least 60% alcohol. Clean your hands often Wash your hands often with soap and water for at least 20 seconds. This is especially important after blowing your nose, coughing, or sneezing; going to the bathroom; and before eating or preparing food. Use hand sanitizer if soap and water are not available. Use an alcohol-based hand sanitizer with at least 60% alcohol, covering all surfaces of your hands and rubbing them together until they feel dry. Soap and water are the best option, especially if hands are visibly dirty. Avoid touching your eyes, nose, and mouth with unwashed hands. Handwashing Tips Avoid sharing personal household items Do not share dishes, drinking glasses, cups, eating utensils, towels, or bedding with other people in your home. Wash these items thoroughly after using them with soap and water or put in the dishwasher. Clean surfaces in your home regularly Clean and disinfect high-touch surfaces (for example, doorknobs, tables, handles, light switches, and countertops) in your "sick room" and bathroom. In shared spaces, you should clean and disinfect surfaces and items after each use by the person who is ill. If you are sick and cannot clean, a caregiver or other person should only clean and disinfect the area around you (such as your bedroom and bathroom) on an as needed basis. Your caregiver/other person should  wait as long as possible (at least several hours) and wear a mask before entering, cleaning, and disinfecting shared spaces that you use. Clean and disinfect areas that may have blood, stool, or body fluids on them. Use household cleaners and disinfectants. Clean visible dirty surfaces with household cleaners containing soap or detergent. Then, use a household disinfectant. Use a product from [employer] List N: Disinfectants for Coronavirus (XHBZJ-69). Be sure to follow the instructions on the label to ensure safe and effective use of the product. Many products recommend keeping the surface wet with a disinfectant for a certain period of time (look at "contact time" on the product label). You may also need to wear personal protective equipment, such as gloves, depending  on the directions on the product label. Immediately after disinfecting, wash your hands with soap and water for 20 seconds. For completed guidance on cleaning and disinfecting your home, visit Complete Disinfection Guidance. Take steps to improve ventilation at home Improve ventilation (air flow) at home to help prevent from spreading COVID-19 to other people in your household. Clear out COVID-19 virus particles in the air by opening windows, using air filters, and turning on fans in your home. Use this interactive tool to learn how to improve air flow in your home. When you can be around others after being sick with COVID-19 Deciding when you can be around others is different for different situations. Find out when you can safely end home isolation. For any additional questions about your care, contact your healthcare provider or state or local health department. 07/11/2020 Content source: [facility] ([facility]), Division of Viral Diseases This information is not intended to replace advice given to you by your health care provider. Make sure you discuss any questions you have with your health  care provider. Document Revised: 08/24/2020 Document Reviewed: 08/24/2020 Elsevier Patient Education  2022 [employer].      If you have been instructed to have an in-person evaluation today at a local Urgent Care facility, please use the link below. It will take you to a list of all of our available [facility], including address, phone number and hours of operation. Please do not delay care.  [facility]  If you or a family member do not have a primary care provider, use the link below to schedule a visit and establish care. When you choose a [facility] primary care physician or advanced practice provider, you gain a long-term partner in health. Find a Primary Care Provider  Learn more about [facility] in-office and virtual care options: [facility]

## 2021-05-01 NOTE — Telephone Encounter (Signed)
Sch per 1/10 inbasket, pt aware °

## 2021-05-02 ENCOUNTER — Inpatient Hospital Stay: Payer: Medicare HMO

## 2021-05-03 ENCOUNTER — Inpatient Hospital Stay: Payer: Medicare HMO

## 2021-05-04 ENCOUNTER — Telehealth: Payer: Self-pay

## 2021-05-04 NOTE — Telephone Encounter (Signed)
The patient's husband called regarding Christy Kelley's appointment on Monday 05/07/21. She recently was diagnosed with covid (05/01/21) and the husband was wanting to know what number they needed to call when they arrived Monday. I explained to the husband that per policy it is too soon for his wife to come into the facility and that her treatment would need to be rescheduled. The husband became very argumentative and stated "that he did not care what the policy said". I let him know that I would talk to the charge nurse regarding this and that we would call them back with a date that Christy Kelley could come in for her appointment. He said he did not want anyone to call them and that a mychart could be sent instead. [name] and [name] were notified regarding this matter.

## 2021-05-04 NOTE — Progress Notes (Deleted)
[facility] OFFICE PROGRESS NOTE  Michela Pitcher, NP [place] [address]  DIAGNOSIS: Extensive stage (T2 a, N2, M1b) small cell lung cancer diagnosed in September 2022 and presented with bulky right hilar mass with mediastinal lymphadenopathy and occlusion of the distal right main bronchus and right lower and middle lobe bronchi by the mass lesion with few subcentimeter brain metastasis.  PRIOR THERAPY:  Systemic chemotherapy with cisplatin 80 Mg/M2 on day 1 and etoposide 100 Mg/M2 on days 1, 2 and 3 status post 1 cycle.  This treatment was discontinued after the patient was found to have brain metastasis.  CURRENT THERAPY: Systemic chemotherapy with carboplatin for AUC of 5 on day 1, etoposide 100 Mg/M2 on days 1, 2 and 3 with Cosela before chemotherapy as well as Imfinzi 1500 Mg IV every 3 weeks.  First dose February 20, 2021.  Status post 2 cycles.  Neulasta was added to the patient's treatment plan starting from cycle 2 due to neutropenia.  INTERVAL HISTORY: Laterra Lubinski 71 y.o. female returns to clinic today for follow-up visit accompanied by her husband.  The patient was last seen in clinic on 04/10/2021.  The patient had some prolonged neutropenia following cycle #1 and therefore, Neulasta was added to the patient's treatment plan.  She tolerated cycle #2 ***.  Radiation oncology is planning on arranging for whole brain radiation following cycle #4 of treatment.  The patient feels fairly well today without any concerning complaints.  She denies any fever, chills, night sweats, or unexplained weight loss since her last appointment.  The patient is currently on Megace.  She denies any chest pain, shortness of breath, cough, or hemoptysis.  Denies any nausea, vomiting, diarrhea, or constipation.  Denies any headache or visual changes.  The patient recently had a restaging CT scan following her last cycle of treatment.  She is here today for evaluation, repeat  blood work, and to review her scan results before starting cycle #3.    MEDICAL HISTORY: Past Medical History:  Diagnosis Date   Arthritis    Lung cancer ([place])    Dx 9/27 with lung cancer    ALLERGIES:  has No Known Allergies.  MEDICATIONS:  Current Outpatient Medications  Medication Sig Dispense Refill   Ascorbic Acid (VITAMIN C PO) Take 1,000 mg by mouth daily.     benzonatate (TESSALON) 100 MG capsule Take 1 capsule (100 mg total) by mouth 3 (three) times daily as needed for cough. 30 capsule 0   calcium carbonate (OSCAL) 1500 (600 Ca) MG TABS tablet Take by mouth daily with breakfast.     meloxicam (MOBIC) 15 MG tablet Take 1 tablet (15 mg total) by mouth daily. 30 tablet 2   Multiple Vitamin (MULTIVITAMIN) tablet Take 1 tablet by mouth daily.     nirmatrelvir/ritonavir EUA (PAXLOVID) 20 x 150 MG & 10 x 100MG  TABS Take 3 tablets by mouth 2 (two) times daily for 5 days. (Take nirmatrelvir 150 mg two tablets twice daily for 5 days and ritonavir 100 mg one tablet twice daily for 5 days) Patient GFR is > 60 30 tablet 0   OVER THE COUNTER MEDICATION Balance of Nature supplement     temazepam (RESTORIL) 15 MG capsule Take 1 capsule (15 mg total) by mouth at bedtime as needed for sleep. 30 capsule 0   Vitamin D, Cholecalciferol, 25 MCG (1000 UT) TABS Take 1,000 Units by mouth daily.     No current facility-administered medications  for this visit.    SURGICAL HISTORY:  Past Surgical History:  Procedure Laterality Date   BRONCHIAL WASHINGS  01/18/2021   Procedure: BRONCHIAL WASHINGS;  Surgeon: Julian Hy, DO;  Location: [facility];  Service: Endoscopy;;   ENDOBRONCHIAL ULTRASOUND N/A 01/18/2021   Procedure: ENDOBRONCHIAL ULTRASOUND;  Surgeon: Julian Hy, DO;  Location: [facility];  Service: Endoscopy;  Laterality: N/A;   FINE NEEDLE ASPIRATION  01/18/2021   Procedure: FINE NEEDLE ASPIRATION (FNA) LINEAR;  Surgeon: Julian Hy, DO;  Location: [facility];  Service:  Endoscopy;;   VIDEO BRONCHOSCOPY N/A 01/18/2021   Procedure: VIDEO BRONCHOSCOPY WITHOUT FLUORO;  Surgeon: Julian Hy, DO;  Location: [facility];  Service: Endoscopy;  Laterality: N/A;    REVIEW OF SYSTEMS:   Review of Systems  Constitutional: Negative for appetite change, chills, fatigue, fever and unexpected weight change.  HENT:   Negative for mouth sores, nosebleeds, sore throat and trouble swallowing.   Eyes: Negative for eye problems and icterus.  Respiratory: Negative for cough, hemoptysis, shortness of breath and wheezing.   Cardiovascular: Negative for chest pain and leg swelling.  Gastrointestinal: Negative for abdominal pain, constipation, diarrhea, nausea and vomiting.  Genitourinary: Negative for bladder incontinence, difficulty urinating, dysuria, frequency and hematuria.   Musculoskeletal: Negative for back pain, gait problem, neck pain and neck stiffness.  Skin: Negative for itching and rash.  Neurological: Negative for dizziness, extremity weakness, gait problem, headaches, light-headedness and seizures.  Hematological: Negative for adenopathy. Does not bruise/bleed easily.  Psychiatric/Behavioral: Negative for confusion, depression and sleep disturbance. The patient is not nervous/anxious.     PHYSICAL EXAMINATION:  There were no vitals taken for this visit.  ECOG PERFORMANCE STATUS: {CHL ONC ECOG [id number]  Physical Exam  Constitutional: Oriented to person, place, and time and well-developed, well-nourished, and in no distress. No distress.  HENT:  Head: Normocephalic and atraumatic.  Mouth/Throat: Oropharynx is clear and moist. No oropharyngeal exudate.  Eyes: Conjunctivae are normal. Right eye exhibits no discharge. Left eye exhibits no discharge. No scleral icterus.  Neck: Normal range of motion. Neck supple.  Cardiovascular: Normal rate, regular rhythm, normal heart sounds and intact distal pulses.   Pulmonary/Chest: Effort normal and breath sounds  normal. No respiratory distress. No wheezes. No rales.  Abdominal: Soft. Bowel sounds are normal. Exhibits no distension and no mass. There is no tenderness.  Musculoskeletal: Normal range of motion. Exhibits no edema.  Lymphadenopathy:    No cervical adenopathy.  Neurological: Alert and oriented to person, place, and time. Exhibits normal muscle tone. Gait normal. Coordination normal.  Skin: Skin is warm and dry. No rash noted. Not diaphoretic. No erythema. No pallor.  Psychiatric: Mood, memory and judgment normal.  Vitals reviewed.  LABORATORY DATA: Lab Results  Component Value Date   WBC 4.1 04/10/2021   HGB 11.0 (L) 04/10/2021   HCT 33.1 (L) 04/10/2021   MCV 98.2 04/10/2021   PLT 297 04/10/2021      Chemistry      Component Value Date/Time   NA 142 04/10/2021 0835   K 4.0 04/10/2021 0835   CL 107 04/10/2021 0835   CO2 27 04/10/2021 0835   BUN 22 04/10/2021 0835   CREATININE 0.68 04/10/2021 0835      Component Value Date/Time   CALCIUM 9.3 04/10/2021 0835   ALKPHOS 72 04/10/2021 0835   AST 27 04/10/2021 0835   ALT 15 04/10/2021 0835   BILITOT 0.3 04/10/2021 0835       RADIOGRAPHIC STUDIES:  CT Chest W Contrast  Result Date: 04/14/2021 CLINICAL DATA:  Small cell lung cancer, chemotherapy progress, XRT complete EXAM: CT CHEST, ABDOMEN, AND PELVIS WITH CONTRAST TECHNIQUE: Multidetector CT imaging of the chest, abdomen and pelvis was performed following the standard protocol during bolus administration of intravenous contrast. CONTRAST:  53mL OMNIPAQUE IOHEXOL 350 MG/ML SOLN COMPARISON:  PET-CT dated 01/29/2021 FINDINGS: CT CHEST FINDINGS Cardiovascular: Heart is normal in size.  No pericardial effusion. No evidence of thoracic aortic aneurysm. Mild atherosclerotic calcifications of the aortic arch. Mild coronary atherosclerosis of the LAD. Mitral valve annular calcifications. Mediastinum/Nodes: No suspicious mediastinal, axillary, or hilar lymphadenopathy. Visualized  thyroid is unremarkable. Lungs/Pleura: Prior central right lung/perihilar mass has resolved. Mild post treatment changes in the right upper lobe (series 6/image 58). No suspicious pulmonary nodules. No focal consolidation. Mild centrilobular and paraseptal emphysematous changes, upper lung predominant. No pleural effusion or pneumothorax. Musculoskeletal: Mild degenerative changes of the thoracic spine. CT ABDOMEN PELVIS FINDINGS Hepatobiliary: Liver is notable for scattered small hepatic cysts measuring up to 12 mm in segment 4 (series 2/image 64). No suspicious/enhancing hepatic lesions. Gallbladder is unremarkable. No intrahepatic or extrahepatic ductal dilatation. Pancreas: Within normal limits. Spleen: Within normal limits. Adrenals/Urinary Tract: Adrenal glands are within normal limits. Kidneys are within normal limits.  No hydronephrosis. Bladder is within normal limits. Stomach/Bowel: Stomach is within normal limits. No evidence of bowel obstruction. Normal appendix (series 2/image 91). No colonic wall thickening or mass is seen. Vascular/Lymphatic: No evidence of abdominal aortic aneurysm. Atherosclerotic calcifications of the abdominal aorta and branch vessels. No suspicious abdominopelvic lymphadenopathy. Reproductive: Uterus and right ovary are unremarkable. Lobulated, calcified mass in the left adnexa measuring 6.6 x 5.4 cm (series 2/image 100), previously favored to be related to the uterus on PET, but better evaluated on the current study and likely related to left adnexa/ovary. This is favored to reflect a broad ligament fibroid or less likely an ovarian fibrothecoma. Given the lack of hypermetabolism on prior PET, this be considered benign. Other: No abdominopelvic ascites. Musculoskeletal: Mild degenerative changes of the lumbar spine. IMPRESSION: No evidence of recurrent or metastatic disease. Mild radiation changes in the right upper lobe. Benign hepatic cysts. Benign left ovarian/adnexal mass,  possibly a broad ligament fibroid or ovarian fibrothecoma. Aortic Atherosclerosis (ICD10-I70.0) and Emphysema (ICD10-J43.9). Electronically Signed   By: Julian Hy M.D.   On: 04/14/2021 08:58   MR Brain W Wo Contrast  Result Date: 04/13/2021 CLINICAL DATA:  Small cell lung cancer. Staging. Follow-up previous treated metastatic disease. EXAM: MRI HEAD WITHOUT AND WITH CONTRAST TECHNIQUE: Multiplanar, multiecho pulse sequences of the brain and surrounding structures were obtained without and with intravenous contrast. CONTRAST:  51mL GADAVIST GADOBUTROL 1 MMOL/ML IV SOLN COMPARISON:  02/01/2021 FINDINGS: Brain: Today's study suffers from rather considerable motion degradation. Contrast enhancement additionally appears less conspicuous, possibly due to less administered contrast. There are 11 newly seen metastases. Axial image 11 series 19 shows a 3 mm metastasis in the left cerebellum. Axial image 42 series 19 shows a 3 mm metastasis in the right parietal region. Axial image 39 series 19 shows a 2 mm metastasis in the left parietal region. Axial image 38 series 19 shows a 2 mm metastasis at the left lateral frontoparietal junction. 4 mm metastasis within the right basal ganglia axial image 24 series 19. 2 mm metastasis within the right basal ganglia axial image 25 series 19. 3 mm metastasis in the left basal ganglia axial image 26 series 19. 2 mm metastasis in the  right thalamus axial image 24 series 19. 2 mm metastasis right frontal lobe axial image 35 series 19. 3 mm metastasis axial image 34 series 19. 2 mm metastasis left posterior frontal lobe axial image 35 series 19. Elsewhere, 2-4 mm metastases identified previously within the cerebral hemispheres are unchanged. No hydrocephalus. No extra-axial collection. No ischemic infarction. There are mild chronic small-vessel ischemic changes of the hemispheric white matter. Vascular: Major vessels at the base of the brain show flow. Skull and upper cervical  spine: Negative Sinuses/Orbits: Clear/normal Other: None IMPRESSION: Eleven new metastases identified. 1 new metastasis in the left cerebellum and 10 scattered within both cerebral hemispheres as outlined above. None of these are larger than 4 mm in size or associated with any hemorrhage or mass effect. New lesions are marked with double arrows on series 19. Other small metastases within the cerebral hemispheres seen on the previous study are essentially stable. Because of motion degradation and relatively poor contrast conspicuity, there could be some additional small new lesions. Electronically Signed   By: Nelson Chimes M.D.   On: 04/13/2021 11:44   CT Abdomen Pelvis W Contrast  Result Date: 04/14/2021 CLINICAL DATA:  Small cell lung cancer, chemotherapy progress, XRT complete EXAM: CT CHEST, ABDOMEN, AND PELVIS WITH CONTRAST TECHNIQUE: Multidetector CT imaging of the chest, abdomen and pelvis was performed following the standard protocol during bolus administration of intravenous contrast. CONTRAST:  100mL OMNIPAQUE IOHEXOL 350 MG/ML SOLN COMPARISON:  PET-CT dated 01/29/2021 FINDINGS: CT CHEST FINDINGS Cardiovascular: Heart is normal in size.  No pericardial effusion. No evidence of thoracic aortic aneurysm. Mild atherosclerotic calcifications of the aortic arch. Mild coronary atherosclerosis of the LAD. Mitral valve annular calcifications. Mediastinum/Nodes: No suspicious mediastinal, axillary, or hilar lymphadenopathy. Visualized thyroid is unremarkable. Lungs/Pleura: Prior central right lung/perihilar mass has resolved. Mild post treatment changes in the right upper lobe (series 6/image 58). No suspicious pulmonary nodules. No focal consolidation. Mild centrilobular and paraseptal emphysematous changes, upper lung predominant. No pleural effusion or pneumothorax. Musculoskeletal: Mild degenerative changes of the thoracic spine. CT ABDOMEN PELVIS FINDINGS Hepatobiliary: Liver is notable for scattered small  hepatic cysts measuring up to 12 mm in segment 4 (series 2/image 64). No suspicious/enhancing hepatic lesions. Gallbladder is unremarkable. No intrahepatic or extrahepatic ductal dilatation. Pancreas: Within normal limits. Spleen: Within normal limits. Adrenals/Urinary Tract: Adrenal glands are within normal limits. Kidneys are within normal limits.  No hydronephrosis. Bladder is within normal limits. Stomach/Bowel: Stomach is within normal limits. No evidence of bowel obstruction. Normal appendix (series 2/image 91). No colonic wall thickening or mass is seen. Vascular/Lymphatic: No evidence of abdominal aortic aneurysm. Atherosclerotic calcifications of the abdominal aorta and branch vessels. No suspicious abdominopelvic lymphadenopathy. Reproductive: Uterus and right ovary are unremarkable. Lobulated, calcified mass in the left adnexa measuring 6.6 x 5.4 cm (series 2/image 100), previously favored to be related to the uterus on PET, but better evaluated on the current study and likely related to left adnexa/ovary. This is favored to reflect a broad ligament fibroid or less likely an ovarian fibrothecoma. Given the lack of hypermetabolism on prior PET, this be considered benign. Other: No abdominopelvic ascites. Musculoskeletal: Mild degenerative changes of the lumbar spine. IMPRESSION: No evidence of recurrent or metastatic disease. Mild radiation changes in the right upper lobe. Benign hepatic cysts. Benign left ovarian/adnexal mass, possibly a broad ligament fibroid or ovarian fibrothecoma. Aortic Atherosclerosis (ICD10-I70.0) and Emphysema (ICD10-J43.9). Electronically Signed   By: Julian Hy M.D.   On: 04/14/2021 08:58  ASSESSMENT/PLAN:  This is a very pleasant 71 year old Caucasian female diagnosed with extensive stage (T2 a, N2, M1B) small cell lung cancer.  She presented with bulky right hilar mass and mediastinal lymphadenopathy with occlusion of the distal right mainstem bronchus and right  lower lobe bronchus as well as subcentimeter brain metastasis.  She was diagnosed in September 2022.  The patient started systemic chemotherapy with cisplatin and etoposide she has some fatigue and weakness as well as diarrhea, lack of appetite, and weight loss with her first cycle of treatment.  This was discontinued after 1 cycle after the staging work-up showed multiple subcentimeter brain metastases.  Therefore, her treatment was changed to palliative systemic chemotherapy with carboplatin for an AUC of 5 on day 1, etoposide 100 mg per metered squared on days 1, 2, and 3 with Cosela as well as Imfinzi 1500 mg IV every 3 weeks on day 1 of treatment.  She is status post 2 cycles.  Neulasta was added from cycle #2 due to neutropenia.  Radiation oncology is planning on performing whole brain radiation after cycle #4 of chemotherapy.  I have shared this information with the patient today.  The patient recently had a restaging CT scan performed.  The patient was seen with Dr. Julien Nordmann who personally and independently reviewed the scan.  The scan showed ***.   Recommend that she proceed with cycle #3 today scheduled.  We will see her back for follow-up visit in 3 weeks for evaluation before starting cycle #4.  At her next appointment, we will help facilitate a follow-up visit with radiation oncology about the whole brain radiation.  She will continue taking Restoril for her insomnia.  She will continue taking Megace for her decreased appetite.  The patient was advised to call immediately if she has any concerning symptoms in the interval. The patient voices understanding of current disease status and treatment options and is in agreement with the current care plan. All questions were answered. The patient knows to call the clinic with any problems, questions or concerns. We can certainly see the patient much sooner if necessary       No orders of the defined types were placed in this  encounter.    I spent {CHL ONC TIME VISIT - EPPIR:[id number]} counseling the patient face to face. The total time spent in the appointment was {CHL ONC TIME VISIT - TKZSW:[id number]}.  Carlean Crowl L [place] Doster, PA-C 05/04/21

## 2021-05-05 ENCOUNTER — Ambulatory Visit: Payer: Medicare HMO

## 2021-05-07 ENCOUNTER — Other Ambulatory Visit: Payer: Medicare HMO

## 2021-05-07 ENCOUNTER — Inpatient Hospital Stay: Payer: Medicare HMO | Admitting: Physician Assistant

## 2021-05-07 ENCOUNTER — Inpatient Hospital Stay: Payer: Medicare HMO

## 2021-05-07 ENCOUNTER — Ambulatory Visit: Payer: Medicare HMO | Admitting: Physician Assistant

## 2021-05-08 ENCOUNTER — Encounter: Payer: Self-pay | Admitting: Internal Medicine

## 2021-05-08 ENCOUNTER — Inpatient Hospital Stay: Payer: Medicare HMO

## 2021-05-08 ENCOUNTER — Ambulatory Visit: Payer: Medicare HMO

## 2021-05-08 ENCOUNTER — Other Ambulatory Visit: Payer: Medicare HMO

## 2021-05-08 ENCOUNTER — Ambulatory Visit: Payer: Medicare HMO | Admitting: Internal Medicine

## 2021-05-09 ENCOUNTER — Inpatient Hospital Stay: Payer: Medicare HMO

## 2021-05-09 ENCOUNTER — Ambulatory Visit: Payer: Medicare HMO

## 2021-05-09 ENCOUNTER — Encounter: Payer: Self-pay | Admitting: Internal Medicine

## 2021-05-09 NOTE — Telephone Encounter (Signed)
I spoke with pts husband and he confirmed only he has been dx with COVID and pt had limited exposure and has been staying in another family member who her husband has been ill. He states pts has no sx.  I advised as a precaution we have scheduled the pt with COVID protochols, therefore she can still received her tx tomorrow. Mr. Jeneen Rinks expressed understanding of this information and confirmed he has the number to contact once pt is outside the building.

## 2021-05-10 ENCOUNTER — Inpatient Hospital Stay: Payer: Medicare HMO

## 2021-05-10 ENCOUNTER — Other Ambulatory Visit: Payer: Self-pay

## 2021-05-10 ENCOUNTER — Ambulatory Visit: Payer: Medicare HMO

## 2021-05-10 ENCOUNTER — Inpatient Hospital Stay: Payer: Medicare HMO | Admitting: Internal Medicine

## 2021-05-10 ENCOUNTER — Inpatient Hospital Stay: Payer: Medicare HMO | Attending: Internal Medicine

## 2021-05-10 VITALS — BP 104/81 | HR 109 | Temp 97.7°F | Resp 16

## 2021-05-10 DIAGNOSIS — C7931 Secondary malignant neoplasm of brain: Secondary | ICD-10-CM | POA: Diagnosis not present

## 2021-05-10 DIAGNOSIS — Z79899 Other long term (current) drug therapy: Secondary | ICD-10-CM | POA: Insufficient documentation

## 2021-05-10 DIAGNOSIS — Z5112 Encounter for antineoplastic immunotherapy: Secondary | ICD-10-CM

## 2021-05-10 DIAGNOSIS — C3491 Malignant neoplasm of unspecified part of right bronchus or lung: Secondary | ICD-10-CM | POA: Diagnosis not present

## 2021-05-10 DIAGNOSIS — C3401 Malignant neoplasm of right main bronchus: Secondary | ICD-10-CM | POA: Diagnosis not present

## 2021-05-10 DIAGNOSIS — G4700 Insomnia, unspecified: Secondary | ICD-10-CM | POA: Insufficient documentation

## 2021-05-10 DIAGNOSIS — Z5111 Encounter for antineoplastic chemotherapy: Secondary | ICD-10-CM | POA: Diagnosis not present

## 2021-05-10 DIAGNOSIS — R630 Anorexia: Secondary | ICD-10-CM | POA: Diagnosis not present

## 2021-05-10 LAB — CBC WITH DIFFERENTIAL (CANCER CENTER ONLY)
Abs Immature Granulocytes: 0.03 10*3/uL (ref 0.00–0.07)
Basophils Absolute: 0 10*3/uL (ref 0.0–0.1)
Basophils Relative: 1 %
Eosinophils Absolute: 0.1 10*3/uL (ref 0.0–0.5)
Eosinophils Relative: 3 %
HCT: 31 % — ABNORMAL LOW (ref 36.0–46.0)
Hemoglobin: 10 g/dL — ABNORMAL LOW (ref 12.0–15.0)
Immature Granulocytes: 1 %
Lymphocytes Relative: 14 %
Lymphs Abs: 0.7 10*3/uL (ref 0.7–4.0)
MCH: 32.7 pg (ref 26.0–34.0)
MCHC: 32.3 g/dL (ref 30.0–36.0)
MCV: 101.3 fL — ABNORMAL HIGH (ref 80.0–100.0)
Monocytes Absolute: 0.5 10*3/uL (ref 0.1–1.0)
Monocytes Relative: 10 %
Neutro Abs: 3.3 10*3/uL (ref 1.7–7.7)
Neutrophils Relative %: 71 %
Platelet Count: 286 10*3/uL (ref 150–400)
RBC: 3.06 MIL/uL — ABNORMAL LOW (ref 3.87–5.11)
RDW: 15.2 % (ref 11.5–15.5)
WBC Count: 4.7 10*3/uL (ref 4.0–10.5)
nRBC: 0 % (ref 0.0–0.2)

## 2021-05-10 LAB — CMP (CANCER CENTER ONLY)
ALT: 9 U/L (ref 0–44)
AST: 23 U/L (ref 15–41)
Albumin: 3.8 g/dL (ref 3.5–5.0)
Alkaline Phosphatase: 70 U/L (ref 38–126)
Anion gap: 6 (ref 5–15)
BUN: 17 mg/dL (ref 8–23)
CO2: 28 mmol/L (ref 22–32)
Calcium: 8.9 mg/dL (ref 8.9–10.3)
Chloride: 105 mmol/L (ref 98–111)
Creatinine: 0.63 mg/dL (ref 0.44–1.00)
GFR, Estimated: >60 mL/min (ref >60)
Glucose, Bld: 91 mg/dL (ref 70–99)
Potassium: 3.8 mmol/L (ref 3.5–5.1)
Sodium: 139 mmol/L (ref 135–145)
Total Bilirubin: 0.3 mg/dL (ref 0.3–1.2)
Total Protein: 6.1 g/dL — ABNORMAL LOW (ref 6.5–8.1)

## 2021-05-10 LAB — MAGNESIUM: Magnesium: 2.1 mg/dL (ref 1.7–2.4)

## 2021-05-10 LAB — TSH: TSH: <0.08 u[IU]/mL — ABNORMAL LOW (ref 0.308–3.960)

## 2021-05-10 MED ORDER — SODIUM CHLORIDE 0.9 % IV SOLN
348.0000 mg | Freq: Once | INTRAVENOUS | Status: AC
  Administered 2021-05-10: 350 mg via INTRAVENOUS
  Filled 2021-05-10: qty 35

## 2021-05-10 MED ORDER — TRILACICLIB DIHYDROCHLORIDE INJECTION 300 MG
240.0000 mg/m2 | Freq: Once | INTRAVENOUS | Status: AC
  Administered 2021-05-10: 360 mg via INTRAVENOUS
  Filled 2021-05-10: qty 24

## 2021-05-10 MED ORDER — SODIUM CHLORIDE 0.9 % IV SOLN
100.0000 mg/m2 | Freq: Once | INTRAVENOUS | Status: AC
  Administered 2021-05-10: 150 mg via INTRAVENOUS
  Filled 2021-05-10: qty 7.5

## 2021-05-10 MED ORDER — SODIUM CHLORIDE 0.9 % IV SOLN: Freq: Once | INTRAVENOUS | Status: AC

## 2021-05-10 MED ORDER — SODIUM CHLORIDE 0.9 % IV SOLN
10.0000 mg | Freq: Once | INTRAVENOUS | Status: AC
  Administered 2021-05-10: 10 mg via INTRAVENOUS
  Filled 2021-05-10: qty 10

## 2021-05-10 MED ORDER — SODIUM CHLORIDE 0.9 % IV SOLN
150.0000 mg | Freq: Once | INTRAVENOUS | Status: AC
  Administered 2021-05-10: 150 mg via INTRAVENOUS
  Filled 2021-05-10: qty 150

## 2021-05-10 MED ORDER — PALONOSETRON HCL INJECTION 0.25 MG/5ML
0.2500 mg | Freq: Once | INTRAVENOUS | Status: AC
  Administered 2021-05-10: 0.25 mg via INTRAVENOUS
  Filled 2021-05-10: qty 5

## 2021-05-10 MED ORDER — SODIUM CHLORIDE 0.9 % IV SOLN
1500.0000 mg | Freq: Once | INTRAVENOUS | Status: AC
  Administered 2021-05-10: 1500 mg via INTRAVENOUS
  Filled 2021-05-10: qty 30

## 2021-05-10 NOTE — Progress Notes (Signed)
Per Dr. Julien Nordmann - okay to proceed with treatment today.

## 2021-05-10 NOTE — Progress Notes (Signed)
[facility] [phone]   [phone]  OFFICE PROGRESS NOTE  Michela Pitcher, NP [place] [address]  DIAGNOSIS: Extensive stage (T2 a, N2, M1b) small cell lung cancer diagnosed in September 2022 and presented with bulky right hilar mass with mediastinal lymphadenopathy and occlusion of the distal right main bronchus and right lower and middle lobe bronchi by the mass lesion with few subcentimeter brain metastasis.  PRIOR THERAPY: Systemic chemotherapy with cisplatin 80 Mg/M2 on day 1 and etoposide 100 Mg/M2 on days 1, 2 and 3 status post 1 cycle.  This treatment was discontinued after the patient was found to have brain metastasis.  CURRENT THERAPY: Systemic chemotherapy with carboplatin for AUC of 5 on day 1, etoposide 100 Mg/M2 on days 1, 2 and 3 with Cosela before chemotherapy as well as Imfinzi 1500 Mg IV every 3 weeks.  First dose February 20, 2021.  Status post 2 cycles.  INTERVAL HISTORY: Christy Kelley 71 y.o. female was seen at the chemotherapy infusion room today for evaluation before starting her treatment.  The patient was diagnosed with COVID-19 more than 2 weeks ago and her treatment was delayed because of the infection.  She is feeling much better today with no concerning complaints she denied having any fatigue or weakness.  She has no nausea, vomiting, diarrhea or constipation.  She denied having any headache or visual changes.  She has no recent weight loss or night sweats.  She had repeat CT scan of the chest, abdomen pelvis as well as MRI of the brain few weeks ago after her last visit and we are here to discuss her scan as well as treatment options.  MEDICAL HISTORY: Past Medical History:  Diagnosis Date   Arthritis    Lung cancer ([facility])    Dx 9/27 with lung cancer    ALLERGIES:  has No Known Allergies.  MEDICATIONS:  Current Outpatient Medications  Medication Sig Dispense Refill   Ascorbic Acid (VITAMIN C  PO) Take 1,000 mg by mouth daily.     benzonatate (TESSALON) 100 MG capsule Take 1 capsule (100 mg total) by mouth 3 (three) times daily as needed for cough. 30 capsule 0   calcium carbonate (OSCAL) 1500 (600 Ca) MG TABS tablet Take by mouth daily with breakfast.     meloxicam (MOBIC) 15 MG tablet Take 1 tablet (15 mg total) by mouth daily. 30 tablet 2   Multiple Vitamin (MULTIVITAMIN) tablet Take 1 tablet by mouth daily.     OVER THE COUNTER MEDICATION Balance of Nature supplement     temazepam (RESTORIL) 15 MG capsule Take 1 capsule (15 mg total) by mouth at bedtime as needed for sleep. 30 capsule 0   Vitamin D, Cholecalciferol, 25 MCG (1000 UT) TABS Take 1,000 Units by mouth daily.     No current facility-administered medications for this visit.    SURGICAL HISTORY:  Past Surgical History:  Procedure Laterality Date   BRONCHIAL WASHINGS  01/18/2021   Procedure: BRONCHIAL WASHINGS;  Surgeon: Julian Hy, DO;  Location: [facility];  Service: Endoscopy;;   ENDOBRONCHIAL ULTRASOUND N/A 01/18/2021   Procedure: ENDOBRONCHIAL ULTRASOUND;  Surgeon: Julian Hy, DO;  Location: [facility];  Service: Endoscopy;  Laterality: N/A;   FINE NEEDLE ASPIRATION  01/18/2021   Procedure: FINE NEEDLE ASPIRATION (FNA) LINEAR;  Surgeon: Julian Hy, DO;  Location: [facility];  Service: Endoscopy;;   VIDEO BRONCHOSCOPY N/A 01/18/2021   Procedure:  VIDEO BRONCHOSCOPY WITHOUT FLUORO;  Surgeon: Julian Hy, DO;  Location: [facility];  Service: Endoscopy;  Laterality: N/A;    REVIEW OF SYSTEMS:  Constitutional: positive for fatigue Eyes: negative Ears, nose, mouth, throat, and face: negative Respiratory: negative Cardiovascular: negative Gastrointestinal: negative Genitourinary:negative Integument/breast: negative Hematologic/lymphatic: negative Musculoskeletal:negative Neurological: negative Behavioral/Psych: negative Endocrine: negative Allergic/Immunologic: negative   PHYSICAL  EXAMINATION: General appearance: alert, cooperative, fatigued, and no distress Head: Normocephalic, without obvious abnormality, atraumatic Neck: no adenopathy, no JVD, supple, symmetrical, trachea midline, and thyroid not enlarged, symmetric, no tenderness/mass/nodules Lymph nodes: Cervical, supraclavicular, and axillary nodes normal. Resp: clear to auscultation bilaterally Back: symmetric, no curvature. ROM normal. No CVA tenderness. Cardio: regular rate and rhythm, S1, S2 normal, no murmur, click, rub or gallop GI: soft, non-tender; bowel sounds normal; no masses,  no organomegaly Extremities: extremities normal, atraumatic, no cyanosis or edema Neurologic: Alert and oriented X 3, normal strength and tone. Normal symmetric reflexes. Normal coordination and gait  ECOG PERFORMANCE STATUS: 1 - Symptomatic but completely ambulatory  There were no vitals taken for this visit.  LABORATORY DATA: Lab Results  Component Value Date   WBC 4.1 04/10/2021   HGB 11.0 (L) 04/10/2021   HCT 33.1 (L) 04/10/2021   MCV 98.2 04/10/2021   PLT 297 04/10/2021      Chemistry      Component Value Date/Time   NA 142 04/10/2021 0835   K 4.0 04/10/2021 0835   CL 107 04/10/2021 0835   CO2 27 04/10/2021 0835   BUN 22 04/10/2021 0835   CREATININE 0.68 04/10/2021 0835      Component Value Date/Time   CALCIUM 9.3 04/10/2021 0835   ALKPHOS 72 04/10/2021 0835   AST 27 04/10/2021 0835   ALT 15 04/10/2021 0835   BILITOT 0.3 04/10/2021 0835       RADIOGRAPHIC STUDIES: CT Chest W Contrast  Result Date: 04/14/2021 CLINICAL DATA:  Small cell lung cancer, chemotherapy progress, XRT complete EXAM: CT CHEST, ABDOMEN, AND PELVIS WITH CONTRAST TECHNIQUE: Multidetector CT imaging of the chest, abdomen and pelvis was performed following the standard protocol during bolus administration of intravenous contrast. CONTRAST:  17mL OMNIPAQUE IOHEXOL 350 MG/ML SOLN COMPARISON:  PET-CT dated 01/29/2021 FINDINGS: CT CHEST  FINDINGS Cardiovascular: Heart is normal in size.  No pericardial effusion. No evidence of thoracic aortic aneurysm. Mild atherosclerotic calcifications of the aortic arch. Mild coronary atherosclerosis of the LAD. Mitral valve annular calcifications. Mediastinum/Nodes: No suspicious mediastinal, axillary, or hilar lymphadenopathy. Visualized thyroid is unremarkable. Lungs/Pleura: Prior central right lung/perihilar mass has resolved. Mild post treatment changes in the right upper lobe (series 6/image 58). No suspicious pulmonary nodules. No focal consolidation. Mild centrilobular and paraseptal emphysematous changes, upper lung predominant. No pleural effusion or pneumothorax. Musculoskeletal: Mild degenerative changes of the thoracic spine. CT ABDOMEN PELVIS FINDINGS Hepatobiliary: Liver is notable for scattered small hepatic cysts measuring up to 12 mm in segment 4 (series 2/image 64). No suspicious/enhancing hepatic lesions. Gallbladder is unremarkable. No intrahepatic or extrahepatic ductal dilatation. Pancreas: Within normal limits. Spleen: Within normal limits. Adrenals/Urinary Tract: Adrenal glands are within normal limits. Kidneys are within normal limits.  No hydronephrosis. Bladder is within normal limits. Stomach/Bowel: Stomach is within normal limits. No evidence of bowel obstruction. Normal appendix (series 2/image 91). No colonic wall thickening or mass is seen. Vascular/Lymphatic: No evidence of abdominal aortic aneurysm. Atherosclerotic calcifications of the abdominal aorta and branch vessels. No suspicious abdominopelvic lymphadenopathy. Reproductive: Uterus and right ovary are unremarkable. Lobulated, calcified mass in  the left adnexa measuring 6.6 x 5.4 cm (series 2/image 100), previously favored to be related to the uterus on PET, but better evaluated on the current study and likely related to left adnexa/ovary. This is favored to reflect a broad ligament fibroid or less likely an ovarian  fibrothecoma. Given the lack of hypermetabolism on prior PET, this be considered benign. Other: No abdominopelvic ascites. Musculoskeletal: Mild degenerative changes of the lumbar spine. IMPRESSION: No evidence of recurrent or metastatic disease. Mild radiation changes in the right upper lobe. Benign hepatic cysts. Benign left ovarian/adnexal mass, possibly a broad ligament fibroid or ovarian fibrothecoma. Aortic Atherosclerosis (ICD10-I70.0) and Emphysema (ICD10-J43.9). Electronically Signed   By: Julian Hy M.D.   On: 04/14/2021 08:58   MR Brain W Wo Contrast  Result Date: 04/13/2021 CLINICAL DATA:  Small cell lung cancer. Staging. Follow-up previous treated metastatic disease. EXAM: MRI HEAD WITHOUT AND WITH CONTRAST TECHNIQUE: Multiplanar, multiecho pulse sequences of the brain and surrounding structures were obtained without and with intravenous contrast. CONTRAST:  66mL GADAVIST GADOBUTROL 1 MMOL/ML IV SOLN COMPARISON:  02/01/2021 FINDINGS: Brain: Today's study suffers from rather considerable motion degradation. Contrast enhancement additionally appears less conspicuous, possibly due to less administered contrast. There are 11 newly seen metastases. Axial image 11 series 19 shows a 3 mm metastasis in the left cerebellum. Axial image 42 series 19 shows a 3 mm metastasis in the right parietal region. Axial image 39 series 19 shows a 2 mm metastasis in the left parietal region. Axial image 38 series 19 shows a 2 mm metastasis at the left lateral frontoparietal junction. 4 mm metastasis within the right basal ganglia axial image 24 series 19. 2 mm metastasis within the right basal ganglia axial image 25 series 19. 3 mm metastasis in the left basal ganglia axial image 26 series 19. 2 mm metastasis in the right thalamus axial image 24 series 19. 2 mm metastasis right frontal lobe axial image 35 series 19. 3 mm metastasis axial image 34 series 19. 2 mm metastasis left posterior frontal lobe axial image 35  series 19. Elsewhere, 2-4 mm metastases identified previously within the cerebral hemispheres are unchanged. No hydrocephalus. No extra-axial collection. No ischemic infarction. There are mild chronic small-vessel ischemic changes of the hemispheric white matter. Vascular: Major vessels at the base of the brain show flow. Skull and upper cervical spine: Negative Sinuses/Orbits: Clear/normal Other: None IMPRESSION: Eleven new metastases identified. 1 new metastasis in the left cerebellum and 10 scattered within both cerebral hemispheres as outlined above. None of these are larger than 4 mm in size or associated with any hemorrhage or mass effect. New lesions are marked with double arrows on series 19. Other small metastases within the cerebral hemispheres seen on the previous study are essentially stable. Because of motion degradation and relatively poor contrast conspicuity, there could be some additional small new lesions. Electronically Signed   By: Nelson Chimes M.D.   On: 04/13/2021 11:44   CT Abdomen Pelvis W Contrast  Result Date: 04/14/2021 CLINICAL DATA:  Small cell lung cancer, chemotherapy progress, XRT complete EXAM: CT CHEST, ABDOMEN, AND PELVIS WITH CONTRAST TECHNIQUE: Multidetector CT imaging of the chest, abdomen and pelvis was performed following the standard protocol during bolus administration of intravenous contrast. CONTRAST:  54mL OMNIPAQUE IOHEXOL 350 MG/ML SOLN COMPARISON:  PET-CT dated 01/29/2021 FINDINGS: CT CHEST FINDINGS Cardiovascular: Heart is normal in size.  No pericardial effusion. No evidence of thoracic aortic aneurysm. Mild atherosclerotic calcifications of the aortic arch. Mild coronary  atherosclerosis of the LAD. Mitral valve annular calcifications. Mediastinum/Nodes: No suspicious mediastinal, axillary, or hilar lymphadenopathy. Visualized thyroid is unremarkable. Lungs/Pleura: Prior central right lung/perihilar mass has resolved. Mild post treatment changes in the right  upper lobe (series 6/image 58). No suspicious pulmonary nodules. No focal consolidation. Mild centrilobular and paraseptal emphysematous changes, upper lung predominant. No pleural effusion or pneumothorax. Musculoskeletal: Mild degenerative changes of the thoracic spine. CT ABDOMEN PELVIS FINDINGS Hepatobiliary: Liver is notable for scattered small hepatic cysts measuring up to 12 mm in segment 4 (series 2/image 64). No suspicious/enhancing hepatic lesions. Gallbladder is unremarkable. No intrahepatic or extrahepatic ductal dilatation. Pancreas: Within normal limits. Spleen: Within normal limits. Adrenals/Urinary Tract: Adrenal glands are within normal limits. Kidneys are within normal limits.  No hydronephrosis. Bladder is within normal limits. Stomach/Bowel: Stomach is within normal limits. No evidence of bowel obstruction. Normal appendix (series 2/image 91). No colonic wall thickening or mass is seen. Vascular/Lymphatic: No evidence of abdominal aortic aneurysm. Atherosclerotic calcifications of the abdominal aorta and branch vessels. No suspicious abdominopelvic lymphadenopathy. Reproductive: Uterus and right ovary are unremarkable. Lobulated, calcified mass in the left adnexa measuring 6.6 x 5.4 cm (series 2/image 100), previously favored to be related to the uterus on PET, but better evaluated on the current study and likely related to left adnexa/ovary. This is favored to reflect a broad ligament fibroid or less likely an ovarian fibrothecoma. Given the lack of hypermetabolism on prior PET, this be considered benign. Other: No abdominopelvic ascites. Musculoskeletal: Mild degenerative changes of the lumbar spine. IMPRESSION: No evidence of recurrent or metastatic disease. Mild radiation changes in the right upper lobe. Benign hepatic cysts. Benign left ovarian/adnexal mass, possibly a broad ligament fibroid or ovarian fibrothecoma. Aortic Atherosclerosis (ICD10-I70.0) and Emphysema (ICD10-J43.9).  Electronically Signed   By: Julian Hy M.D.   On: 04/14/2021 08:58    ASSESSMENT AND PLAN: This is a very pleasant 71 years old white female recently diagnosed with extensive stage (T2 a, N2, M1b) small cell lung cancer presented with bulky right hilar mass and mediastinal lymphadenopathy with occlusion of the distal right mainstem bronchus and right lower lobe bronchus as well as new subcentimeter brain metastasis diagnosed in September 2022. The patient started systemic chemotherapy with cisplatin and etoposide last week and tolerated the first week of her treatment fairly well except for increasing fatigue and weakness as well as diarrhea, lack of appetite and weight loss. Unfortunately the staging work-up showed multiple subcentimeter brain metastasis.  The PET scan showed no evidence of metastatic disease outside the previously known areas in the chest. She is here today for evaluation before starting the first cycle of palliative systemic chemotherapy but changing the regimen to carboplatin for AUC of 5 on day 1 and etoposide 100 Mg/M2 on days 1, 2 and 3 with Cosela 240 Mg/M2 before chemotherapy as well as Imfinzi 1500 Mg IV every 3 weeks on day one of the chemotherapy.  Status post 2 cycles of this regimen. The patient tolerated the last cycle of her treatment well with no concerning adverse effect except for mild fatigue.  She had repeat CT scan of the chest, abdomen and pelvis performed few weeks ago that showed no concerning findings for disease progression and there was improvement of her disease.  Unfortunately MRI of the brain showed new multiple small brain lesions and it was discussed with radiation oncology and they would consider The patient for whole brain irradiation after completion of cycle #4 of her treatment. She recovered from  her recent COVID infection and I recommended for the patient to proceed with cycle #3 of her systemic chemotherapy today as planned. She will come back  for follow-up visit in 3 weeks for evaluation before the next cycle of her treatment. For the lack of appetite, the patient will continue her current treatment with Megace ES.. For the insomnia, she will continue her current treatment with Restoril. The patient was advised to call immediately if she has any concerning symptoms in the interval. The patient voices understanding of current disease status and treatment options and is in agreement with the current care plan.  All questions were answered. The patient knows to call the clinic with any problems, questions or concerns. We can certainly see the patient much sooner if necessary.  The total time spent in the appointment was 35 minutes.  Disclaimer: This note was dictated with voice recognition software. Similar sounding words can inadvertently be transcribed and may not be corrected upon review.

## 2021-05-10 NOTE — Patient Instructions (Signed)
[facility]  Discharge Instructions: Thank you for choosing [facility] to provide your oncology and hematology care.   If you have a lab appointment with the [facility], please go directly to the [facility] and check in at the registration area.   Wear comfortable clothing and clothing appropriate for easy access to any Portacath or PICC line.   We strive to give you quality time with your provider. You may need to reschedule your appointment if you arrive late (15 or more minutes).  Arriving late affects you and other patients whose appointments are after yours.  Also, if you miss three or more appointments without notifying the office, you may be dismissed from the clinic at the providers discretion.      For prescription refill requests, have your pharmacy contact our office and allow 72 hours for refills to be completed.    Today you received the following chemotherapy and/or immunotherapy agents: Cosela, Imfinzi, Carboplatin, & Etoposide   To help prevent nausea and vomiting after your treatment, we encourage you to take your nausea medication as directed.  BELOW ARE SYMPTOMS THAT SHOULD BE REPORTED IMMEDIATELY: *FEVER GREATER THAN 100.4 F (38 C) OR HIGHER *CHILLS OR SWEATING *NAUSEA AND VOMITING THAT IS NOT CONTROLLED WITH YOUR NAUSEA MEDICATION *UNUSUAL SHORTNESS OF BREATH *UNUSUAL BRUISING OR BLEEDING *URINARY PROBLEMS (pain or burning when urinating, or frequent urination) *BOWEL PROBLEMS (unusual diarrhea, constipation, pain near the anus) TENDERNESS IN MOUTH AND THROAT WITH OR WITHOUT PRESENCE OF ULCERS (sore throat, sores in mouth, or a toothache) UNUSUAL RASH, SWELLING OR PAIN  UNUSUAL VAGINAL DISCHARGE OR ITCHING   Items with * indicate a potential emergency and should be followed up as soon as possible or go to the Emergency Department if any problems should occur.  Please show the CHEMOTHERAPY ALERT CARD or  IMMUNOTHERAPY ALERT CARD at check-in to the Emergency Department and triage nurse.  Should you have questions after your visit or need to cancel or reschedule your appointment, please contact [facility]  Dept: [phone]  and follow the prompts.  Office hours are 8:00 a.m. to 4:30 p.m. Monday - Friday. Please note that voicemails left after 4:00 p.m. may not be returned until the following business day.  We are closed weekends and major holidays. You have access to a nurse at all times for urgent questions. Please call the main number to the clinic Dept: [phone] and follow the prompts.   For any non-urgent questions, you may also contact your provider using MyChart. We now offer e-Visits for anyone 67 and older to request care online for non-urgent symptoms. For details visit [email or web address].   Also download the MyChart app! Go to the app store, search "MyChart", open the app, select [facility], and log in with your MyChart username and password.  Due to Covid, a mask is required upon entering the hospital/clinic. If you do not have a mask, one will be given to you upon arrival. For doctor visits, patients may have 1 support person aged 68 or older with them. For treatment visits, patients cannot have anyone with them due to current Covid guidelines and our immunocompromised population.

## 2021-05-11 ENCOUNTER — Inpatient Hospital Stay: Payer: Medicare HMO

## 2021-05-11 VITALS — BP 103/80 | HR 113 | Temp 98.0°F | Resp 18

## 2021-05-11 DIAGNOSIS — Z5111 Encounter for antineoplastic chemotherapy: Secondary | ICD-10-CM | POA: Diagnosis not present

## 2021-05-11 DIAGNOSIS — C7931 Secondary malignant neoplasm of brain: Secondary | ICD-10-CM | POA: Diagnosis not present

## 2021-05-11 DIAGNOSIS — Z79899 Other long term (current) drug therapy: Secondary | ICD-10-CM | POA: Diagnosis not present

## 2021-05-11 DIAGNOSIS — G4700 Insomnia, unspecified: Secondary | ICD-10-CM | POA: Diagnosis not present

## 2021-05-11 DIAGNOSIS — C3491 Malignant neoplasm of unspecified part of right bronchus or lung: Secondary | ICD-10-CM

## 2021-05-11 DIAGNOSIS — R630 Anorexia: Secondary | ICD-10-CM | POA: Diagnosis not present

## 2021-05-11 DIAGNOSIS — C3401 Malignant neoplasm of right main bronchus: Secondary | ICD-10-CM | POA: Diagnosis not present

## 2021-05-11 MED ORDER — TRILACICLIB DIHYDROCHLORIDE INJECTION 300 MG
240.0000 mg/m2 | Freq: Once | INTRAVENOUS | Status: AC
  Administered 2021-05-11: 360 mg via INTRAVENOUS
  Filled 2021-05-11: qty 24

## 2021-05-11 MED ORDER — SODIUM CHLORIDE 0.9 % IV SOLN
100.0000 mg/m2 | Freq: Once | INTRAVENOUS | Status: DC
  Filled 2021-05-11 (×2): qty 7.5

## 2021-05-11 MED ORDER — SODIUM CHLORIDE 0.9 % IV SOLN: Freq: Once | INTRAVENOUS | Status: AC

## 2021-05-11 MED ORDER — SODIUM CHLORIDE 0.9 % IV SOLN
10.0000 mg | Freq: Once | INTRAVENOUS | Status: AC
  Administered 2021-05-11: 10 mg via INTRAVENOUS
  Filled 2021-05-11: qty 10

## 2021-05-11 MED ORDER — SODIUM CHLORIDE 0.9 % IV SOLN
100.0000 mg/m2 | Freq: Once | INTRAVENOUS | Status: AC
  Administered 2021-05-11: 150 mg via INTRAVENOUS
  Filled 2021-05-11: qty 7.5

## 2021-05-11 MED FILL — Dexamethasone Sodium Phosphate Inj 100 MG/10ML: INTRAMUSCULAR | Qty: 1 | Status: AC

## 2021-05-11 NOTE — Patient Instructions (Signed)
[facility]  Discharge Instructions: Thank you for choosing [facility] to provide your oncology and hematology care.   If you have a lab appointment with the [facility], please go directly to the [facility] and check in at the registration area.   Wear comfortable clothing and clothing appropriate for easy access to any Portacath or PICC line.   We strive to give you quality time with your provider. You may need to reschedule your appointment if you arrive late (15 or more minutes).  Arriving late affects you and other patients whose appointments are after yours.  Also, if you miss three or more appointments without notifying the office, you may be dismissed from the clinic at the providers discretion.      For prescription refill requests, have your pharmacy contact our office and allow 72 hours for refills to be completed.    Today you received the following chemotherapy and/or immunotherapy agents: Cosela and Etoposide      To help prevent nausea and vomiting after your treatment, we encourage you to take your nausea medication as directed.  BELOW ARE SYMPTOMS THAT SHOULD BE REPORTED IMMEDIATELY: *FEVER GREATER THAN 100.4 F (38 C) OR HIGHER *CHILLS OR SWEATING *NAUSEA AND VOMITING THAT IS NOT CONTROLLED WITH YOUR NAUSEA MEDICATION *UNUSUAL SHORTNESS OF BREATH *UNUSUAL BRUISING OR BLEEDING *URINARY PROBLEMS (pain or burning when urinating, or frequent urination) *BOWEL PROBLEMS (unusual diarrhea, constipation, pain near the anus) TENDERNESS IN MOUTH AND THROAT WITH OR WITHOUT PRESENCE OF ULCERS (sore throat, sores in mouth, or a toothache) UNUSUAL RASH, SWELLING OR PAIN  UNUSUAL VAGINAL DISCHARGE OR ITCHING   Items with * indicate a potential emergency and should be followed up as soon as possible or go to the Emergency Department if any problems should occur.  Please show the CHEMOTHERAPY ALERT CARD or IMMUNOTHERAPY ALERT CARD at  check-in to the Emergency Department and triage nurse.  Should you have questions after your visit or need to cancel or reschedule your appointment, please contact [facility]  Dept: [phone]  and follow the prompts.  Office hours are 8:00 a.m. to 4:30 p.m. Monday - Friday. Please note that voicemails left after 4:00 p.m. may not be returned until the following business day.  We are closed weekends and major holidays. You have access to a nurse at all times for urgent questions. Please call the main number to the clinic Dept: [phone] and follow the prompts.   For any non-urgent questions, you may also contact your provider using MyChart. We now offer e-Visits for anyone 89 and older to request care online for non-urgent symptoms. For details visit [email or web address].   Also download the MyChart app! Go to the app store, search "MyChart", open the app, select [facility], and log in with your MyChart username and password.  Due to Covid, a mask is required upon entering the hospital/clinic. If you do not have a mask, one will be given to you upon arrival. For doctor visits, patients may have 1 support person aged 35 or older with them. For treatment visits, patients cannot have anyone with them due to current Covid guidelines and our immunocompromised population.

## 2021-05-11 NOTE — Progress Notes (Unsigned)
Ok to treat w/ ELevated hr w/ Day 2 etoposide. Per Dr. Alen Blew

## 2021-05-12 ENCOUNTER — Inpatient Hospital Stay: Payer: Medicare HMO

## 2021-05-12 ENCOUNTER — Other Ambulatory Visit: Payer: Self-pay

## 2021-05-12 VITALS — BP 139/88 | HR 98 | Temp 98.7°F

## 2021-05-12 DIAGNOSIS — C7931 Secondary malignant neoplasm of brain: Secondary | ICD-10-CM | POA: Diagnosis not present

## 2021-05-12 DIAGNOSIS — C3401 Malignant neoplasm of right main bronchus: Secondary | ICD-10-CM | POA: Diagnosis not present

## 2021-05-12 DIAGNOSIS — C3491 Malignant neoplasm of unspecified part of right bronchus or lung: Secondary | ICD-10-CM

## 2021-05-12 DIAGNOSIS — R630 Anorexia: Secondary | ICD-10-CM | POA: Diagnosis not present

## 2021-05-12 DIAGNOSIS — Z5111 Encounter for antineoplastic chemotherapy: Secondary | ICD-10-CM | POA: Diagnosis not present

## 2021-05-12 DIAGNOSIS — G4700 Insomnia, unspecified: Secondary | ICD-10-CM | POA: Diagnosis not present

## 2021-05-12 DIAGNOSIS — Z79899 Other long term (current) drug therapy: Secondary | ICD-10-CM | POA: Diagnosis not present

## 2021-05-12 MED ORDER — SODIUM CHLORIDE 0.9 % IV SOLN
10.0000 mg | Freq: Once | INTRAVENOUS | Status: AC
  Administered 2021-05-12: 10 mg via INTRAVENOUS
  Filled 2021-05-12: qty 10

## 2021-05-12 MED ORDER — SODIUM CHLORIDE 0.9 % IV SOLN: Freq: Once | INTRAVENOUS | Status: AC

## 2021-05-12 MED ORDER — TRILACICLIB DIHYDROCHLORIDE INJECTION 300 MG
240.0000 mg/m2 | Freq: Once | INTRAVENOUS | Status: AC
  Administered 2021-05-12: 360 mg via INTRAVENOUS
  Filled 2021-05-12: qty 24

## 2021-05-12 MED ORDER — SODIUM CHLORIDE 0.9 % IV SOLN
100.0000 mg/m2 | Freq: Once | INTRAVENOUS | Status: AC
  Administered 2021-05-12: 150 mg via INTRAVENOUS
  Filled 2021-05-12: qty 7.5

## 2021-05-14 ENCOUNTER — Inpatient Hospital Stay: Payer: Medicare HMO

## 2021-05-14 ENCOUNTER — Telehealth: Payer: Self-pay | Admitting: Internal Medicine

## 2021-05-14 ENCOUNTER — Other Ambulatory Visit: Payer: Self-pay

## 2021-05-14 VITALS — BP 132/77 | HR 109 | Temp 99.2°F | Resp 16

## 2021-05-14 DIAGNOSIS — Z5111 Encounter for antineoplastic chemotherapy: Secondary | ICD-10-CM | POA: Diagnosis not present

## 2021-05-14 DIAGNOSIS — C3491 Malignant neoplasm of unspecified part of right bronchus or lung: Secondary | ICD-10-CM

## 2021-05-14 DIAGNOSIS — R630 Anorexia: Secondary | ICD-10-CM | POA: Diagnosis not present

## 2021-05-14 DIAGNOSIS — C7931 Secondary malignant neoplasm of brain: Secondary | ICD-10-CM | POA: Diagnosis not present

## 2021-05-14 DIAGNOSIS — Z79899 Other long term (current) drug therapy: Secondary | ICD-10-CM | POA: Diagnosis not present

## 2021-05-14 DIAGNOSIS — G4700 Insomnia, unspecified: Secondary | ICD-10-CM | POA: Diagnosis not present

## 2021-05-14 DIAGNOSIS — C3401 Malignant neoplasm of right main bronchus: Secondary | ICD-10-CM | POA: Diagnosis not present

## 2021-05-14 MED ORDER — PEGFILGRASTIM-CBQV 6 MG/0.6ML ~~LOC~~ SOSY
6.0000 mg | PREFILLED_SYRINGE | Freq: Once | SUBCUTANEOUS | Status: AC
  Administered 2021-05-14: 6 mg via SUBCUTANEOUS

## 2021-05-14 NOTE — Progress Notes (Signed)
Synopsis: Referred for limited stage SCLC by Michela Pitcher, NP  Subjective:   PATIENT ID: Christy Kelley GENDER: female DOB: [birth year], MRN: [id number]  Chief Complaint  Patient presents with   Follow-up    Breathing is "perfect"- denies any coughing, wheezing, chest tightness.     18yF with recently discovered limited stage SCLC, history of smoking.   She was hospitalized 9/28-9/30 where she was seen by pulmonary consult team for RLL mass and mediastinal adenopathy. Underwent EBUS revealing SCLC with involvement at 4R, RLL mass. DUring procedure, noted 90% occlusive mass in R mainstem that was friable and unable to be traversed with scope. Seen in follow up with Oncology 10/5 with plan to complete staging with PET/CT and MR brain, chemotherapy.   She does have cough. No hemoptysis. Not productive. She does have some dyspnea with exertion - it isn't limiting her activity at present since they're trying to rest as much as possible during anticipated chemotherapy. No chest pain. No lightheadedness, fever, bleeding.   Smoked 20 years half ppd. Quit smoking last week. Has never had PFTs before.   She has no family history of lung disease.   She worked as a Sports administrator. No exposure to dusts/solvents/particulates without a mask at home or work. Has a dog at home.   Interval HPI:  Last seen by me 01/26/21 at which point planned PFTs for workup of DOE, consideration of tumor debulking as well. Subsequent staging workup unfortunately showed brain metastases. Interval CT CAP did however show substantial reduction in size of right hilar mass with treatment.   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 Seen by oncology with Dr. Julien Nordmann 1/19.   She says she feels wonderful. Little DOE on walks with her husband. She has no cough, CP. No hospitalizations, AECOPD since last visit.    Otherwise pertinent review of systems  is negative.    Past Medical History:  Diagnosis Date   Arthritis    Lung cancer ([place])    Dx 9/27 with lung cancer     Family History  Problem Relation Age of Onset   Arthritis/Rheumatoid Mother    Diabetes Father    Hodgkin's lymphoma Father    Colon cancer Neg Hx    Colon polyps Neg Hx    Esophageal cancer Neg Hx    Rectal cancer Neg Hx    Stomach cancer Neg Hx      Past Surgical History:  Procedure Laterality Date   BRONCHIAL WASHINGS  01/18/2021   Procedure: BRONCHIAL WASHINGS;  Surgeon: Julian Hy, DO;  Location: [facility];  Service: Endoscopy;;   ENDOBRONCHIAL ULTRASOUND N/A 01/18/2021   Procedure: ENDOBRONCHIAL ULTRASOUND;  Surgeon: Julian Hy, DO;  Location[facility];  Service: Endoscopy;  Laterality: N/A;   FINE NEEDLE ASPIRATION  01/18/2021   Procedure: FINE NEEDLE ASPIRATION (FNA) LINEAR;  Surgeon: Julian Hy, DO;  Location: [facility];  Service: Endoscopy;;   VIDEO BRONCHOSCOPY N/A 01/18/2021   Procedure: VIDEO BRONCHOSCOPY WITHOUT FLUORO;  Surgeon: Julian Hy, DO;  [facility];  Service: Endoscopy;  Laterality: N/A;    Social History   Socioeconomic History   Marital status: Married    Spouse name: Not on file   Number of children: Not on file   Years of education: Not on file   Highest education level: Not on file  Occupational History   Not on file  Tobacco Use  Smoking status: Former    Packs/day: 0.25    Years: 35.00    Pack years: 8.75    Types: Cigarettes    Quit date: 08/29/2020    Years since quitting: 0.7   Smokeless tobacco: Never  Vaping Use   Vaping Use: Never used  Substance and Sexual Activity   Alcohol use: Yes    Comment: Beer 6 daily. Recenlty went to 2-3 dialy   Drug use: Never   Sexual activity: Not on file  Other Topics Concern   Not on file  Social History Narrative   Not on file   Social Determinants of Health   Financial Resource Strain: Not on file  Food Insecurity: Not on file   Transportation Needs: Not on file  Physical Activity: Not on file  Stress: Not on file  Social Connections: Not on file  Intimate Partner Violence: Not on file     No Known Allergies   Outpatient Medications Prior to Visit  Medication Sig Dispense Refill   Ascorbic Acid (VITAMIN C PO) Take 1,000 mg by mouth daily.     benzonatate (TESSALON) 100 MG capsule Take 1 capsule (100 mg total) by mouth 3 (three) times daily as needed for cough. 30 capsule 0   calcium carbonate (OSCAL) 1500 (600 Ca) MG TABS tablet Take by mouth daily with breakfast.     meloxicam (MOBIC) 15 MG tablet Take 1 tablet (15 mg total) by mouth daily. 30 tablet 2   Multiple Vitamin (MULTIVITAMIN) tablet Take 1 tablet by mouth daily.     OVER THE COUNTER MEDICATION Balance of Nature supplement     temazepam (RESTORIL) 15 MG capsule Take 1 capsule (15 mg total) by mouth at bedtime as needed for sleep. 30 capsule 0   Vitamin D, Cholecalciferol, 25 MCG (1000 UT) TABS Take 1,000 Units by mouth daily.     No facility-administered medications prior to visit.       Objective:   Physical Exam:  General appearance: 71 y.o., female, NAD, conversant  Eyes: anicteric sclerae, moist conjunctivae; no lid-lag; PERRL, tracking appropriately HENT: NCAT; oropharynx, MMM, no mucosal ulcerations; normal hard and soft palate Neck: Trachea midline; no lymphadenopathy, no JVD Lungs: Diminished on right but still hear air movement, no crackles, no wheeze, with normal respiratory effort CV: RRR, no MRGs  Abdomen: Soft, non-tender; non-distended, BS present  Extremities: No peripheral edema, radial and DP pulses present bilaterally  Skin: Normal temperature, turgor and texture; no rash Psych: Appropriate affect Neuro: Alert and oriented to person and place, no focal deficit    Vitals:   05/15/21 1009  BP: 104/60  Pulse: 99  Temp: 97.6 F (36.4 C)  TempSrc: Oral  SpO2: 99%  Weight: 129 lb 12.8 oz (58.9 kg)  Height: 5\' 3"   (1.6 m)    99% on RA BMI Readings from Last 3 Encounters:  05/15/21 22.99 kg/m  04/10/21 22.67 kg/m  03/20/21 22.28 kg/m   Wt Readings from Last 3 Encounters:  05/15/21 129 lb 12.8 oz (58.9 kg)  04/10/21 128 lb (58.1 kg)  03/20/21 125 lb 12.8 oz (57.1 kg)     CBC    Component Value Date/Time   WBC 4.7 05/10/2021 1129   WBC 7.0 01/16/2021 1713   RBC 3.06 (L) 05/10/2021 1129   HGB 10.0 (L) 05/10/2021 1129   HCT 31.0 (L) 05/10/2021 1129   PLT 286 05/10/2021 1129   MCV 101.3 (H) 05/10/2021 1129   MCH 32.7 05/10/2021 1129   MCHC  32.3 05/10/2021 1129   RDW 15.2 05/10/2021 1129   LYMPHSABS 0.7 05/10/2021 1129   MONOABS 0.5 05/10/2021 1129   EOSABS 0.1 05/10/2021 1129   BASOSABS 0.0 05/10/2021 1129      Chest Imaging: CTA Chest 9/27 reviewed by me and remarkable for right hilar mass, occlusion of RMB, mediastinal LAD  CT CAP 04/13/21 reviewed by me with substantial decrease in size of right hilar mass, with now patent RMB, RUL/RML takeoffs  Pulmonary Functions Testing Results: No flowsheet data found.   Pathology:   EBNA 9/29: 4R and RLL mass with SCLC     Assessment & Plan:   # DOE: improved relative to last visit. Not a significant issue for her at present. CT shows dramatic improvement in size of right hilar mass - no longer seems to be any role for endobronchial tumor debulking.  # Smoking: stopped last week.  # SCLC, extensive stage:   Plan: - will hold off on PFTs, inhaler trial, seems to be doing well overall - surveillance imaging per oncology - we'll set up follow up in a year       Maryjane Hurter, MD [facility] 05/15/2021 11:03 AM

## 2021-05-14 NOTE — Telephone Encounter (Signed)
Sch per 1/22 inbasket, pt husband aware

## 2021-05-15 ENCOUNTER — Other Ambulatory Visit: Payer: Self-pay | Admitting: Radiation Oncology

## 2021-05-15 ENCOUNTER — Ambulatory Visit: Payer: Medicare HMO | Admitting: Student

## 2021-05-15 ENCOUNTER — Encounter: Payer: Self-pay | Admitting: Student

## 2021-05-15 VITALS — BP 104/60 | HR 99 | Temp 97.6°F | Ht 63.0 in | Wt 129.8 lb

## 2021-05-15 DIAGNOSIS — C3490 Malignant neoplasm of unspecified part of unspecified bronchus or lung: Secondary | ICD-10-CM

## 2021-05-15 DIAGNOSIS — U071 COVID-19: Secondary | ICD-10-CM | POA: Diagnosis not present

## 2021-05-15 DIAGNOSIS — C3491 Malignant neoplasm of unspecified part of right bronchus or lung: Secondary | ICD-10-CM

## 2021-05-15 MED ORDER — LORAZEPAM 0.5 MG PO TABS: ORAL_TABLET | ORAL | 0 refills | Status: AC

## 2021-05-15 MED ORDER — MEMANTINE HCL 10 MG PO TABS: 10.0000 mg | ORAL_TABLET | Freq: Two times a day (BID) | ORAL | 4 refills | Status: DC

## 2021-05-15 MED ORDER — MEMANTINE HCL 5 MG PO TABS: ORAL_TABLET | ORAL | 0 refills | Status: DC

## 2021-05-15 NOTE — Progress Notes (Signed)
I spoke with the patient's husband today after seeing that the patient had received another cycle of her carboplatin/etoposide.  In summary this is a lovely 71 year old female with what was initially felt to be limited stage small cell lung cancer.  Unfortunately she was found to have extensive stage disease.  She was having planning initially for chemoradiation but with the knowledge of her disease being extensive stage, she had a palliative course of radiotherapy to the chest which was completed in November 2022.  She had 1 cycle of cisplatin and etoposide in October 2022.  When it was found that she had extensive stage disease however her regimen was converted to carboplatin etoposide and she has had 3 cycles of this.  I spoke with medical oncology and their intention is to discontinue chemotherapy at this time but may revisit this in the future.  The patient's husband and I discussed the rationale for proceeding with radiotherapy to the brain.  He is interested in having a repeat MRI since her additional chemotherapy and the MRI findings of the brain.  Dr. Lisbeth Renshaw is in agreement so that we would have a baseline assessment to compare to after we give treatment and follow her in brain and spine oncology conference.  We also discussed the logistics, short and long-term effects of whole brain radiation and the role of Namenda.  The patient has also been struggling with short-term memory loss for months now and her husband desires evaluation with neurology.  We will ask for Dr. Mickeal Skinner to evaluate her.  He is in agreement with this plan.  We discussed the rationale as well for Ativan to be used as she has had a hard time laying flat and still because of her short-term memory changes.  We discussed side effect profile of this as well as the Namenda and will send in these prescriptions to her pharmacy.  Once we have a date for her MRI scan we will also be simultaneously coordinating for simulation.  She will sign  written consent to proceed when she comes for simulation.

## 2021-05-15 NOTE — Patient Instructions (Signed)
-   I'll see you in a year or sooner if you develop any shortness of breath with exertion

## 2021-05-16 ENCOUNTER — Telehealth: Payer: Self-pay | Admitting: Internal Medicine

## 2021-05-16 ENCOUNTER — Telehealth: Payer: Self-pay | Admitting: Physician Assistant

## 2021-05-16 NOTE — Telephone Encounter (Signed)
Scheduled appt with Dr. Mickeal Skinner per 1/24 referral. Spoke to pt's husband who is aware of appt date and time. I also moved pt's weekly lab on 2/8 to 2/7 so that both appts would be on the same day. Pt's husband is aware of all changes and will also check MyChart for updates.

## 2021-05-16 NOTE — Telephone Encounter (Signed)
I called the patient because I received a message he had some questions about his wife's schedule. I clarified his schedule with him. He also was asking about her risk of infection/labs. I reviewed what to look for on her weekly labs regarding risk of infection. He reiterated he would very much like to proceed with the evaluation with Dr. Mickeal Skinner. Radiation oncology placed a referral yesterday. I will follow up on the status of this referral.

## 2021-05-16 NOTE — Telephone Encounter (Signed)
Scheduled per 01/19 los, patient has been called and notified of upcoming appointments.

## 2021-05-18 ENCOUNTER — Inpatient Hospital Stay: Payer: Medicare HMO | Attending: Internal Medicine

## 2021-05-18 ENCOUNTER — Other Ambulatory Visit: Payer: Self-pay

## 2021-05-18 DIAGNOSIS — C3401 Malignant neoplasm of right main bronchus: Secondary | ICD-10-CM | POA: Diagnosis not present

## 2021-05-18 DIAGNOSIS — Z79899 Other long term (current) drug therapy: Secondary | ICD-10-CM | POA: Diagnosis not present

## 2021-05-18 DIAGNOSIS — C3491 Malignant neoplasm of unspecified part of right bronchus or lung: Secondary | ICD-10-CM

## 2021-05-18 LAB — CMP (CANCER CENTER ONLY)
ALT: 8 U/L (ref 0–44)
AST: 19 U/L (ref 15–41)
Albumin: 4 g/dL (ref 3.5–5.0)
Alkaline Phosphatase: 121 U/L (ref 38–126)
Anion gap: 7 (ref 5–15)
BUN: 19 mg/dL (ref 8–23)
CO2: 29 mmol/L (ref 22–32)
Calcium: 9 mg/dL (ref 8.9–10.3)
Chloride: 105 mmol/L (ref 98–111)
Creatinine: 0.65 mg/dL (ref 0.44–1.00)
GFR, Estimated: >60 mL/min (ref >60)
Glucose, Bld: 90 mg/dL (ref 70–99)
Potassium: 3.4 mmol/L — ABNORMAL LOW (ref 3.5–5.1)
Sodium: 141 mmol/L (ref 135–145)
Total Bilirubin: 0.3 mg/dL (ref 0.3–1.2)
Total Protein: 6.2 g/dL — ABNORMAL LOW (ref 6.5–8.1)

## 2021-05-18 LAB — CBC WITH DIFFERENTIAL (CANCER CENTER ONLY)
Abs Immature Granulocytes: 0.11 10*3/uL — ABNORMAL HIGH (ref 0.00–0.07)
Basophils Absolute: 0 10*3/uL (ref 0.0–0.1)
Basophils Relative: 0 %
Eosinophils Absolute: 0.2 10*3/uL (ref 0.0–0.5)
Eosinophils Relative: 1 %
HCT: 28.7 % — ABNORMAL LOW (ref 36.0–46.0)
Hemoglobin: 9.5 g/dL — ABNORMAL LOW (ref 12.0–15.0)
Immature Granulocytes: 1 %
Lymphocytes Relative: 8 %
Lymphs Abs: 1 10*3/uL (ref 0.7–4.0)
MCH: 33.3 pg (ref 26.0–34.0)
MCHC: 33.1 g/dL (ref 30.0–36.0)
MCV: 100.7 fL — ABNORMAL HIGH (ref 80.0–100.0)
Monocytes Absolute: 0.9 10*3/uL (ref 0.1–1.0)
Monocytes Relative: 7 %
Neutro Abs: 10.9 10*3/uL — ABNORMAL HIGH (ref 1.7–7.7)
Neutrophils Relative %: 83 %
Platelet Count: 190 10*3/uL (ref 150–400)
RBC: 2.85 MIL/uL — ABNORMAL LOW (ref 3.87–5.11)
RDW: 14.6 % (ref 11.5–15.5)
Smear Review: NORMAL
WBC Count: 13.1 10*3/uL — ABNORMAL HIGH (ref 4.0–10.5)
nRBC: 0 % (ref 0.0–0.2)

## 2021-05-18 LAB — MAGNESIUM: Magnesium: 1.9 mg/dL (ref 1.7–2.4)

## 2021-05-23 ENCOUNTER — Encounter: Payer: Self-pay | Admitting: *Deleted

## 2021-05-23 ENCOUNTER — Encounter: Payer: Self-pay | Admitting: Radiation Oncology

## 2021-05-24 ENCOUNTER — Encounter: Payer: Self-pay | Admitting: Internal Medicine

## 2021-05-24 ENCOUNTER — Other Ambulatory Visit: Payer: Self-pay

## 2021-05-24 ENCOUNTER — Ambulatory Visit
Admission: RE | Admit: 2021-05-24 | Discharge: 2021-05-24 | Disposition: A | Discharge status: Home / Self Care | Payer: Medicare HMO | Source: Ambulatory Visit | Attending: Radiation Oncology | Admitting: Radiation Oncology

## 2021-05-24 ENCOUNTER — Other Ambulatory Visit: Payer: Self-pay | Admitting: Radiation Therapy

## 2021-05-24 ENCOUNTER — Inpatient Hospital Stay: Payer: Medicare HMO | Attending: Internal Medicine

## 2021-05-24 ENCOUNTER — Telehealth: Payer: Self-pay | Admitting: *Deleted

## 2021-05-24 DIAGNOSIS — C7931 Secondary malignant neoplasm of brain: Secondary | ICD-10-CM | POA: Diagnosis not present

## 2021-05-24 DIAGNOSIS — Z79899 Other long term (current) drug therapy: Secondary | ICD-10-CM | POA: Insufficient documentation

## 2021-05-24 DIAGNOSIS — C3491 Malignant neoplasm of unspecified part of right bronchus or lung: Secondary | ICD-10-CM

## 2021-05-24 DIAGNOSIS — C3431 Malignant neoplasm of lower lobe, right bronchus or lung: Secondary | ICD-10-CM | POA: Insufficient documentation

## 2021-05-24 DIAGNOSIS — Z5111 Encounter for antineoplastic chemotherapy: Secondary | ICD-10-CM | POA: Insufficient documentation

## 2021-05-24 LAB — CMP (CANCER CENTER ONLY)
ALT: 9 U/L (ref 0–44)
AST: 20 U/L (ref 15–41)
Albumin: 4.1 g/dL (ref 3.5–5.0)
Alkaline Phosphatase: 102 U/L (ref 38–126)
Anion gap: 7 (ref 5–15)
BUN: 17 mg/dL (ref 8–23)
CO2: 29 mmol/L (ref 22–32)
Calcium: 9.3 mg/dL (ref 8.9–10.3)
Chloride: 104 mmol/L (ref 98–111)
Creatinine: 0.63 mg/dL (ref 0.44–1.00)
GFR, Estimated: >60 mL/min (ref >60)
Glucose, Bld: 158 mg/dL — ABNORMAL HIGH (ref 70–99)
Potassium: 3.5 mmol/L (ref 3.5–5.1)
Sodium: 140 mmol/L (ref 135–145)
Total Bilirubin: 0.2 mg/dL — ABNORMAL LOW (ref 0.3–1.2)
Total Protein: 6.6 g/dL (ref 6.5–8.1)

## 2021-05-24 LAB — CBC WITH DIFFERENTIAL (CANCER CENTER ONLY)
Abs Immature Granulocytes: 0.13 10*3/uL — ABNORMAL HIGH (ref 0.00–0.07)
Basophils Absolute: 0.1 10*3/uL (ref 0.0–0.1)
Basophils Relative: 1 %
Eosinophils Absolute: 0.1 10*3/uL (ref 0.0–0.5)
Eosinophils Relative: 2 %
HCT: 31.2 % — ABNORMAL LOW (ref 36.0–46.0)
Hemoglobin: 10.1 g/dL — ABNORMAL LOW (ref 12.0–15.0)
Immature Granulocytes: 2 %
Lymphocytes Relative: 13 %
Lymphs Abs: 0.8 10*3/uL (ref 0.7–4.0)
MCH: 32.8 pg (ref 26.0–34.0)
MCHC: 32.4 g/dL (ref 30.0–36.0)
MCV: 101.3 fL — ABNORMAL HIGH (ref 80.0–100.0)
Monocytes Absolute: 0.4 10*3/uL (ref 0.1–1.0)
Monocytes Relative: 6 %
Neutro Abs: 4.6 10*3/uL (ref 1.7–7.7)
Neutrophils Relative %: 76 %
Platelet Count: 193 10*3/uL (ref 150–400)
RBC: 3.08 MIL/uL — ABNORMAL LOW (ref 3.87–5.11)
RDW: 15.6 % — ABNORMAL HIGH (ref 11.5–15.5)
WBC Count: 6 10*3/uL (ref 4.0–10.5)
nRBC: 0 % (ref 0.0–0.2)

## 2021-05-24 LAB — MAGNESIUM: Magnesium: 2.1 mg/dL (ref 1.7–2.4)

## 2021-05-24 MED ORDER — LORAZEPAM 1 MG PO TABS
0.5000 mg | ORAL_TABLET | Freq: Once | ORAL | Status: AC
  Administered 2021-05-24: 0.5 mg via ORAL
  Filled 2021-05-24: qty 0.5

## 2021-05-24 NOTE — Telephone Encounter (Signed)
Called patient to inform of MRI for 05-27-21- arrival time- 8:30 am @ [facility], no restrictions to test, lvm for a return call

## 2021-05-27 ENCOUNTER — Ambulatory Visit (HOSPITAL_COMMUNITY)
Admission: RE | Admit: 2021-05-27 | Discharge: 2021-05-27 | Disposition: A | Discharge status: Home / Self Care | Payer: Medicare HMO | Source: Ambulatory Visit | Attending: Radiation Oncology | Admitting: Radiation Oncology

## 2021-05-27 DIAGNOSIS — C729 Malignant neoplasm of central nervous system, unspecified: Secondary | ICD-10-CM | POA: Diagnosis not present

## 2021-05-27 DIAGNOSIS — C3491 Malignant neoplasm of unspecified part of right bronchus or lung: Secondary | ICD-10-CM | POA: Insufficient documentation

## 2021-05-27 MED ORDER — GADOBUTROL 1 MMOL/ML IV SOLN
6.0000 mL | Freq: Once | INTRAVENOUS | Status: AC | PRN
  Administered 2021-05-27: 6 mL via INTRAVENOUS

## 2021-05-28 ENCOUNTER — Inpatient Hospital Stay: Payer: Medicare HMO

## 2021-05-28 ENCOUNTER — Telehealth: Payer: Self-pay | Admitting: Radiation Oncology

## 2021-05-28 ENCOUNTER — Ambulatory Visit: Payer: Medicare HMO | Admitting: Internal Medicine

## 2021-05-28 DIAGNOSIS — C3431 Malignant neoplasm of lower lobe, right bronchus or lung: Secondary | ICD-10-CM | POA: Diagnosis not present

## 2021-05-28 DIAGNOSIS — C3491 Malignant neoplasm of unspecified part of right bronchus or lung: Secondary | ICD-10-CM | POA: Diagnosis not present

## 2021-05-28 DIAGNOSIS — C7931 Secondary malignant neoplasm of brain: Secondary | ICD-10-CM | POA: Diagnosis not present

## 2021-05-28 NOTE — Telephone Encounter (Signed)
I called the patient's husband and let him know the results of her brain MRI and discussion from conference this morning. She will still proceed with radiation as previously outlined.

## 2021-05-29 ENCOUNTER — Other Ambulatory Visit: Payer: Self-pay

## 2021-05-29 ENCOUNTER — Inpatient Hospital Stay: Payer: Medicare HMO

## 2021-05-29 ENCOUNTER — Encounter: Payer: Self-pay | Admitting: Radiation Oncology

## 2021-05-29 ENCOUNTER — Inpatient Hospital Stay: Payer: Medicare HMO | Admitting: Internal Medicine

## 2021-05-29 VITALS — BP 124/94 | HR 102 | Temp 97.9°F | Resp 15 | Ht 63.0 in | Wt 129.4 lb

## 2021-05-29 DIAGNOSIS — C3431 Malignant neoplasm of lower lobe, right bronchus or lung: Secondary | ICD-10-CM | POA: Diagnosis not present

## 2021-05-29 DIAGNOSIS — Z79899 Other long term (current) drug therapy: Secondary | ICD-10-CM | POA: Diagnosis not present

## 2021-05-29 DIAGNOSIS — C7931 Secondary malignant neoplasm of brain: Secondary | ICD-10-CM | POA: Insufficient documentation

## 2021-05-29 DIAGNOSIS — C3491 Malignant neoplasm of unspecified part of right bronchus or lung: Secondary | ICD-10-CM

## 2021-05-29 DIAGNOSIS — Z5111 Encounter for antineoplastic chemotherapy: Secondary | ICD-10-CM | POA: Diagnosis not present

## 2021-05-29 LAB — CBC WITH DIFFERENTIAL (CANCER CENTER ONLY)
Abs Immature Granulocytes: 0.02 10*3/uL (ref 0.00–0.07)
Basophils Absolute: 0.1 10*3/uL (ref 0.0–0.1)
Basophils Relative: 2 %
Eosinophils Absolute: 0.1 10*3/uL (ref 0.0–0.5)
Eosinophils Relative: 4 %
HCT: 32.6 % — ABNORMAL LOW (ref 36.0–46.0)
Hemoglobin: 10.5 g/dL — ABNORMAL LOW (ref 12.0–15.0)
Immature Granulocytes: 1 %
Lymphocytes Relative: 18 %
Lymphs Abs: 0.7 10*3/uL (ref 0.7–4.0)
MCH: 32.6 pg (ref 26.0–34.0)
MCHC: 32.2 g/dL (ref 30.0–36.0)
MCV: 101.2 fL — ABNORMAL HIGH (ref 80.0–100.0)
Monocytes Absolute: 0.5 10*3/uL (ref 0.1–1.0)
Monocytes Relative: 14 %
Neutro Abs: 2.5 10*3/uL (ref 1.7–7.7)
Neutrophils Relative %: 61 %
Platelet Count: 364 10*3/uL (ref 150–400)
RBC: 3.22 MIL/uL — ABNORMAL LOW (ref 3.87–5.11)
RDW: 14.6 % (ref 11.5–15.5)
WBC Count: 4 10*3/uL (ref 4.0–10.5)
nRBC: 0 % (ref 0.0–0.2)

## 2021-05-29 LAB — CMP (CANCER CENTER ONLY)
ALT: 11 U/L (ref 0–44)
AST: 22 U/L (ref 15–41)
Albumin: 4.2 g/dL (ref 3.5–5.0)
Alkaline Phosphatase: 84 U/L (ref 38–126)
Anion gap: 7 (ref 5–15)
BUN: 19 mg/dL (ref 8–23)
CO2: 28 mmol/L (ref 22–32)
Calcium: 9.5 mg/dL (ref 8.9–10.3)
Chloride: 106 mmol/L (ref 98–111)
Creatinine: 0.62 mg/dL (ref 0.44–1.00)
GFR, Estimated: >60 mL/min (ref >60)
Glucose, Bld: 92 mg/dL (ref 70–99)
Potassium: 4.3 mmol/L (ref 3.5–5.1)
Sodium: 141 mmol/L (ref 135–145)
Total Bilirubin: 0.3 mg/dL (ref 0.3–1.2)
Total Protein: 6.6 g/dL (ref 6.5–8.1)

## 2021-05-29 LAB — TSH: TSH: <0.08 u[IU]/mL — ABNORMAL LOW (ref 0.308–3.960)

## 2021-05-29 LAB — VITAMIN B12: Vitamin B-12: 3213 pg/mL — ABNORMAL HIGH (ref 180–914)

## 2021-05-29 LAB — MAGNESIUM: Magnesium: 2.2 mg/dL (ref 1.7–2.4)

## 2021-05-29 NOTE — Progress Notes (Signed)
[facility] [place], [place] [address] [phone]   New Patient Evaluation  Date of Service: 05/29/21 Patient Name: Christy Kelley Patient MRN: [id number] Patient DOB: 1950-05-11 Provider: Ventura Sellers, MD  Identifying Statement:  Christy Kelley is a 71 y.o. female with Brain metastases [facility]) who presents for initial consultation and evaluation regarding cancer associated neurologic deficits.    Referring Provider: Michela Pitcher, NP [place],   [id number]  Primary Cancer:  Oncologic History: Oncology History  Small cell carcinoma of right lung ([place])  01/24/2021 Initial Diagnosis   Small cell carcinoma of right lung ([place])   01/24/2021 Cancer Staging   Staging form: Lung, AJCC 8th Edition - Clinical: Stage IVA (cT2b, cN2, cM1b) - Signed by Curt Bears, MD on 02/07/2021    01/30/2021 - 02/01/2021 Chemotherapy   Patient is on Treatment Plan : LUNG SMALL CELL Cisplatin D1 + Etoposide D1-3 q21d     02/27/2021 -  Chemotherapy   Patient is on Treatment Plan : LUNG SMALL CELL EXTENSIVE STAGE Durvalumab + Carboplatin D1 + Etoposide D1-3 q21d x 4 Cycles / Durvalumab q28d     SCLC (small cell lung carcinoma), right ([place])  02/07/2021 Initial Diagnosis   SCLC (small cell lung carcinoma), right ([place])   02/27/2021 -  Chemotherapy   Patient is on Treatment Plan : LUNG SMALL CELL EXTENSIVE STAGE Durvalumab + Carboplatin D1 + Etoposide D1-3 q21d x 4 Cycles / Durvalumab q28d       History of Present Illness: The patient's records from the referring physician were obtained and reviewed and the patient interviewed to confirm this HPI.  Christy Kelley and her husband present today to discuss her cognitive impairment.  Husband describes ~10 month history of impaired short term memory, "slowdown" of processing, difficulty finding words, impaired insight.  This started in April following her COVID pneumonia  diagnosis and treatment, and continued as small cell lung cancer was identified and treated starting several months later.  At this time, she is unable to drive or shop for household goods because of these limitations.  She continues to dress and feed herself, and maintain basic needs without aid.  Husband claims that one year ago she was "completely normal" and "sharp".  Currently dosing chemotherapy and immunotherapy with Dr. Julien Nordmann.  Medications: Current Outpatient Medications on File Prior to Visit  Medication Sig Dispense Refill   Ascorbic Acid (VITAMIN C PO) Take 1,000 mg by mouth daily.     calcium carbonate (OSCAL) 1500 (600 Ca) MG TABS tablet Take by mouth daily with breakfast.     Multiple Vitamin (MULTIVITAMIN) tablet Take 1 tablet by mouth daily.     OVER THE COUNTER MEDICATION Balance of Nature supplement     Vitamin D, Cholecalciferol, 25 MCG (1000 UT) TABS Take 1,000 Units by mouth daily.     benzonatate (TESSALON) 100 MG capsule Take 1 capsule (100 mg total) by mouth 3 (three) times daily as needed for cough. (Patient not taking: Reported on 05/29/2021) 30 capsule 0   LORazepam (ATIVAN) 0.5 MG tablet 1 tab po 30 minutes prior to radiation or MRI scans (Patient not taking: Reported on 05/29/2021) 30 tablet 0   meloxicam (MOBIC) 15 MG tablet Take 1 tablet (15 mg total) by mouth daily. (Patient not taking: Reported on 05/29/2021) 30 tablet 2   memantine (NAMENDA) 10 MG tablet Take 1 tablet (10 mg total) by mouth 2 (two) times daily. (  Patient not taking: Reported on 05/29/2021) 60 tablet 4   memantine (NAMENDA) 5 MG tablet Begin this prescription the first day of brain radiation. Week 1: take one tablet po qam. Week 2: take one tablet qam and qpm. Week 3: take two tablets qam, and one tablet po q pm. Week 4: take two tablets qam and qpm. Fill subsequent prescription q month. (Patient not taking: Reported on 05/29/2021) 70 tablet 0   temazepam (RESTORIL) 15 MG capsule Take 1 capsule (15 mg total) by  mouth at bedtime as needed for sleep. (Patient not taking: Reported on 05/29/2021) 30 capsule 0   No current facility-administered medications on file prior to visit.    Allergies: No Known Allergies Past Medical History:  Past Medical History:  Diagnosis Date   Arthritis    Lung cancer ([place])    Dx 9/27 with lung cancer   Past Surgical History:  Past Surgical History:  Procedure Laterality Date   BRONCHIAL WASHINGS  01/18/2021   Procedure: BRONCHIAL WASHINGS;  Surgeon: Julian Hy, DO;  [facility];  Service: Endoscopy;;   ENDOBRONCHIAL ULTRASOUND N/A 01/18/2021   Procedure: ENDOBRONCHIAL ULTRASOUND;  Surgeon: Julian Hy, DO;  [facility];  Service: Endoscopy;  Laterality: N/A;   FINE NEEDLE ASPIRATION  01/18/2021   Procedure: FINE NEEDLE ASPIRATION (FNA) LINEAR;  Surgeon: Julian Hy, DO;  [facility];  Service: Endoscopy;;   VIDEO BRONCHOSCOPY N/A 01/18/2021   Procedure: VIDEO BRONCHOSCOPY WITHOUT FLUORO;  Surgeon: Julian Hy, DO;  [facility];  Service: Endoscopy;  Laterality: N/A;   Social History:  Social History   Socioeconomic History   Marital status: Married    Spouse name: Not on file   Number of children: Not on file   Years of education: Not on file   Highest education level: Not on file  Occupational History   Not on file  Tobacco Use   Smoking status: Former    Packs/day: 0.25    Years: 35.00    Pack years: 8.75    Types: Cigarettes    Quit date: 08/29/2020    Years since quitting: 0.7   Smokeless tobacco: Never  Vaping Use   Vaping Use: Never used  Substance and Sexual Activity   Alcohol use: Yes    Comment: Beer 6 daily. Recenlty went to 2-3 dialy   Drug use: Never   Sexual activity: Not on file  Other Topics Concern   Not on file  Social History Narrative   Not on file   Social Determinants of Health   Financial Resource Strain: Not on file  Food Insecurity: Not on file  Transportation  Needs: Not on file  Physical Activity: Not on file  Stress: Not on file  Social Connections: Not on file  Intimate Partner Violence: Not on file   Family History:  Family History  Problem Relation Age of Onset   Arthritis/Rheumatoid Mother    Diabetes Father    Hodgkin's lymphoma Father    Colon cancer Neg Hx    Colon polyps Neg Hx    Esophageal cancer Neg Hx    Rectal cancer Neg Hx    Stomach cancer Neg Hx     Review of Systems: Constitutional: Doesn't report fevers, chills or abnormal weight loss Eyes: Doesn't report blurriness of vision Ears, nose, mouth, throat, and face: Doesn't report sore throat Respiratory: Doesn't report cough, dyspnea or wheezes Cardiovascular: Doesn't report palpitation, chest discomfort  Gastrointestinal:  Doesn't report nausea,  constipation, diarrhea GU: Doesn't report incontinence Skin: Doesn't report skin rashes Neurological: Per HPI Musculoskeletal: Doesn't report joint pain Behavioral/Psych: Doesn't report anxiety  Physical Exam: Vitals:   05/29/21 1047  BP: (!) 124/94  Pulse: (!) 102  Resp: 15  Temp: 97.9 F (36.6 C)  SpO2: 96%   KPS: 70. General: Alert, cooperative, pleasant, in no acute distress Head: Normal EENT: No conjunctival injection or scleral icterus.  Lungs: Resp effort normal Cardiac: Regular rate Abdomen: Non-distended abdomen Skin: No rashes cyanosis or petechiae. Extremities: No clubbing or edema  Neurologic Exam: Mental Status: Awake, alert, attentive to examiner. Oriented to self and environment. Language is fluent with intact comprehension.  Age advanced psychomotor slowing, impaired recall.  Limited insight into disease, condition. Cranial Nerves: Visual acuity is grossly normal. Visual fields are full. Extra-ocular movements intact. No ptosis. Face is symmetric Motor: Tone and bulk are normal. Power is full in both arms and legs. Reflexes are symmetric, no pathologic reflexes present.  Sensory: Intact to  light touch Gait: Normal.   Labs: I have reviewed the data as listed    Component Value Date/Time   NA 140 05/24/2021 1100   K 3.5 05/24/2021 1100   CL 104 05/24/2021 1100   CO2 29 05/24/2021 1100   GLUCOSE 158 (H) 05/24/2021 1100   BUN 17 05/24/2021 1100   CREATININE 0.63 05/24/2021 1100   CALCIUM 9.3 05/24/2021 1100   PROT 6.6 05/24/2021 1100   ALBUMIN 4.1 05/24/2021 1100   AST 20 05/24/2021 1100   ALT 9 05/24/2021 1100   ALKPHOS 102 05/24/2021 1100   BILITOT 0.2 (L) 05/24/2021 1100   GFRNONAA >60 05/24/2021 1100   Lab Results  Component Value Date   WBC 6.0 05/24/2021   NEUTROABS 4.6 05/24/2021   HGB 10.1 (L) 05/24/2021   HCT 31.2 (L) 05/24/2021   MCV 101.3 (H) 05/24/2021   PLT 193 05/24/2021    Imaging:  MR Brain W Wo Contrast  Addendum Date: 05/28/2021   ADDENDUM REPORT: 05/28/2021 08:23 ADDENDUM: Probable punctate subacute infarct of the right frontal white matter. Electronically Signed   By: Macy Mis M.D.   On: 05/28/2021 08:23   Result Date: 05/28/2021 CLINICAL DATA:  Brain/CNS neoplasm, staging; metastatic lung cancer EXAM: MRI HEAD WITHOUT AND WITH CONTRAST TECHNIQUE: Multiplanar, multiecho pulse sequences of the brain and surrounding structures were obtained without and with intravenous contrast. CONTRAST:  2mL GADAVIST GADOBUTROL 1 MMOL/ML IV SOLN COMPARISON:  04/13/2021 FINDINGS: Brain: None of the previously identified foci of contrast enhancement are definitely identified. There is a punctate focus of mild diffusion hyperintensity with ADC isointensity in the right frontal white matter. There is no intracranial mass, mass effect, or edema. Ventricles are stable in size. There is no hydrocephalus or extra-axial fluid collection. Patchy probable mild chronic microvascular ischemic changes in the cerebral white matter again identified. No new abnormal enhancement. Vascular: Major vessel flow voids at the skull base are preserved. Skull and upper cervical spine:  Normal marrow signal is preserved. Sinuses/Orbits: Paranasal sinuses are aerated. Orbits are unremarkable. Other: Sella is unremarkable.  Mastoid air cells are clear. IMPRESSION: Suboptimal valuation due to motion degradation. However, none of the previously visualized enhancing lesions are definitely identified. No new abnormal enhancement. Electronically Signed: By: Macy Mis M.D. On: 05/28/2021 08:20    [facility] [place] Clinician Interpretation: I have personally reviewed the radiological images as listed.  My interpretation, in the context of the patient's clinical presentation, is stable disease   Assessment/Plan Brain metastases (  [place])  Christy Kelley presents with clinical syndrome consistent with progressive encephalopathy.  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  There could be an additional organic dementia syndrome such as Alzheimer's.   We discussed her recent MRI brain studies; currently the plan is to treat with whole brain radiation. The patient and her husband expressed concern regarding the potential exacerbation of cognitive impairment with whole brain radiation.  Given stability of most recent study, which may be due to some CNS penetrance of carboplatin/etoposide, it might be reasonable to defer WBRT until active disease is more firmly demonstrated.  In this case, we would recommend repeating an MRI in short interval, no more than 2 months.  Case will be discussed and reviewed further with radiation oncology team.    She will continue to visit with Dr. Julien Nordmann for systemic therapy.  TSH and B12 will be added on to today's labs as part of cognitive workup.  We spent twenty additional minutes teaching regarding the natural history, biology, and historical experience in the treatment of neurologic complications of cancer.   We appreciate the opportunity to participate in the care of Christy Kelley.   All  questions were answered. The patient knows to call the clinic with any problems, questions or concerns. No barriers to learning were detected.  The total time spent in the encounter was 40 minutes and more than 50% was on counseling and review of test results   Ventura Sellers, MD Medical Director of Neuro-Oncology [facility] [place] 05/29/21 11:10 AM

## 2021-05-30 ENCOUNTER — Encounter: Payer: Self-pay | Admitting: Internal Medicine

## 2021-05-30 ENCOUNTER — Inpatient Hospital Stay: Payer: Medicare HMO

## 2021-05-30 ENCOUNTER — Telehealth: Payer: Self-pay | Admitting: Radiation Oncology

## 2021-05-30 DIAGNOSIS — C3491 Malignant neoplasm of unspecified part of right bronchus or lung: Secondary | ICD-10-CM

## 2021-05-30 NOTE — Telephone Encounter (Addendum)
In summary this pt with extensive stage small cell carcinoma who was found to have 11 subcm brain metastases has completed palliative radiation to the chest, 4 cycles of chemotherapy, and repeat MRI was negative for any visible brain lesions. We had offered whole brain radiation. She has simultaneously had cognitive decline that has been previously called post covid brain fog. Her symptoms predate her cancer diagnosis, and she met with Dr. Mickeal Skinner yesterday. Given the concerns of whole brain radiation on future cognitive ability, Dr. Mickeal Skinner and the patient's husband are in favor of holding on plans for whole brain radiation until visible disease in the brain returns, but to increase the frequency of brain imaging in 1-2 months. I discussed with Mr. Dieu that we are in agreement and will cancel her treatment schedule. The patient's husband is realistic and wants to preserve quality of life and is in agreement with also meeting with palliative care for additional support and goals of care.

## 2021-05-30 NOTE — Progress Notes (Signed)
°  Radiation Oncology         [phone] ________________________________  Name: Christy Kelley MRN: [id number]  Date: 05/29/2021  DOB: 08-07-1950  End of Treatment Note  Diagnosis:   Extensive Stage Small Cell Carcinoma of the Lung with brain metastases     Indication for treatment:  palliative       Radiation treatment dates:   n/a  Site/planned dose:   Whole brain radiation 30 Gy in 10 fractions  Narrative: The patient's cognitive decline was noted by Dr. Mickeal Skinner. It's felt that this is unrelated to her brain disease, but we decided to cancel plans for whole brain radiation since her interval MRI showed clinical response to her chemotherapy.   Plan: We will follow up with her next MRI in the next 1-2 months, and would offer treatment at that time if she is found to have disease, or sooner if the patient and her husband would like to proceed. ________________________________    Carola Rhine, PAC

## 2021-05-31 ENCOUNTER — Telehealth: Payer: Self-pay

## 2021-05-31 ENCOUNTER — Ambulatory Visit: Payer: Medicare HMO | Admitting: Radiation Oncology

## 2021-05-31 ENCOUNTER — Other Ambulatory Visit: Payer: Self-pay | Admitting: Internal Medicine

## 2021-05-31 ENCOUNTER — Telehealth: Payer: Self-pay | Admitting: Internal Medicine

## 2021-05-31 DIAGNOSIS — C7931 Secondary malignant neoplasm of brain: Secondary | ICD-10-CM

## 2021-05-31 NOTE — Telephone Encounter (Signed)
Sch per 2/9 inbasket , pt husband aware

## 2021-05-31 NOTE — Telephone Encounter (Signed)
Spoke with patient's husband Jeneen Rinks and scheduled a Mychart Palliative Consult for 06/04/21 @ 1 PM.  Consent obtained; updated Outlook/Netsmart/Team List and Epic.

## 2021-06-01 ENCOUNTER — Ambulatory Visit: Payer: Medicare HMO

## 2021-06-04 ENCOUNTER — Ambulatory Visit: Payer: Medicare HMO

## 2021-06-04 ENCOUNTER — Encounter: Payer: Self-pay | Admitting: Internal Medicine

## 2021-06-04 ENCOUNTER — Telehealth: Payer: Medicare HMO | Admitting: Hospice

## 2021-06-04 ENCOUNTER — Other Ambulatory Visit: Payer: Self-pay

## 2021-06-04 DIAGNOSIS — R413 Other amnesia: Secondary | ICD-10-CM

## 2021-06-04 DIAGNOSIS — Z515 Encounter for palliative care: Secondary | ICD-10-CM

## 2021-06-04 DIAGNOSIS — G8929 Other chronic pain: Secondary | ICD-10-CM | POA: Diagnosis not present

## 2021-06-04 DIAGNOSIS — C3491 Malignant neoplasm of unspecified part of right bronchus or lung: Secondary | ICD-10-CM | POA: Diagnosis not present

## 2021-06-04 DIAGNOSIS — M25562 Pain in left knee: Secondary | ICD-10-CM | POA: Diagnosis not present

## 2021-06-04 NOTE — Progress Notes (Signed)
Christy Kelley Consult Note Telephone: [phone]  Fax: [phone]  PATIENT NAME: Christy Kelley [address] [place] [address] [phone] (home)  DOB: 1950/11/10 MRN: [id number]  PRIMARY CARE PROVIDER:    Michela Pitcher, NP,  [facility] [place] [address] [phone]  REFERRING PROVIDER:   Michela Pitcher, NP [address] [place],  [place] [address] [phone]  RESPONSIBLE PARTYJeneen Kelley  [phone] best to call Christy Kelley [phone]     Name Relation Home Work Mobile   Christy Kelley, Christy Kelley [phone]  [phone]       TELEHEALTH VISIT STATEMENT Due to the COVID-19 crisis, this visit was done via telemedicine from my office and it was initiated and consent by this patient and or family.  I connected with patient OR PROXY by a telephone/video  and verified that I am speaking with the correct person. I discussed the limitations of evaluation and management by telemedicine. The patient expressed understanding and agreed to proceed. Prefers virtual visit going forward. Palliative Care was asked to follow this patient to address advance care planning, complex medical decision making and goals of care clarification. This is the initial visit.     ASSESSMENT AND / RECOMMENDATIONS:   Advance Care Planning: Our advance care planning conversation included a discussion about:    The value and importance of advance care planning  Difference between Hospice and Palliative care Exploration of goals of care in the event of a sudden injury or illness  Identification and preparation of a healthcare agent  Review and updating or creation of an  advance directive document . Decision not to resuscitate or to de-escalate disease focused treatments due to poor prognosis.  CODE STATUS: Discussion on CODE STATUS.  Patient and spouse affirmed that patient is a DO NOT RESUSCITATE.  NP will  sign DNR form and upload to epic; same document will be sent to patient by mail as requested.  Goals of Care: Goals include to maximize quality of life and symptom management  I spent  16 minutes providing this initial consultation. More than 50% of the time in this consultation was spent on counseling patient and coordinating communication. --------------------------------------------------------------------------------------------------------------------------------------  Symptom Management/Plan: Lung CA: With mets to the brain. Stopped smoking Sept 2022. Patient completed chemo, radiation. FAST 70%, still goes out a walks with her dog at least a mile a day.  Memory loss: Related to Brain metastasis.  Left knee pain: Managed with Meloxicam Follow up: Palliative care will continue to follow for complex medical decision making, advance care planning, and clarification of goals. Return 6 weeks or prn. Encouraged to call provider sooner with any concerns.   Family /Caregiver/Community Supports: Patient lives at home with spouse.  HOSPICE ELIGIBILITY/DIAGNOSIS: TBD  Chief Complaint: Initial Palliative care visit  HISTORY OF PRESENT ILLNESS:  Christy Kelley is a 71 y.o. year old female  with multiple medical conditions including small cell carcinoma of right lung with brain metastases and associated memory loss.  History of COVID-19 viral infection.,  Osteoarthritis.  Patient denies pain/discomfort/respiratory distress;  reports that sometimes she has occasional pain in her left knee, currently well managed with meloxicam.  History obtained from review of EMR, discussion with primary team, caregiver, family and/or Christy Kelley.  Review and summarization of Epic records shows history from other than patient. Rest of 10 point ROS asked and negative.  I reviewed as needed, available labs, patient  records, imaging, studies and related documents from the EMR.  Recent Labs  Lab 05/29/21 1238  WBC  4.0  HGB 10.5*  HCT 32.6*  PLT 364  MCV 101.2*   Recent Labs  Lab 05/29/21 1238  NA 141  K 4.3  CL 106  CO2 28  BUN 19  CREATININE 0.62  GLUCOSE 92   Latest GFR by Cockcroft Gault (not valid in AKI or ESRD) Estimated Creatinine Clearance: 55.7 mL/min (by C-G formula based on SCr of 0.62 mg/dL). Recent Labs  Lab 05/29/21 1238  AST 22  ALT 11  ALKPHOS 84    ROS General: NAD EYES: denies vision changes ENMT: denies dysphagia Cardiovascular: denies chest pain/discomfort Pulmonary: denies cough, denies SOB Abdomen: endorses good appetite, denies constipation/diarrhea GU: denies dysuria, urinary frequency MSK:  endorses weakness,  no falls reported Skin: denies rashes or wounds Neurological: denies pain, denies insomnia, endorses memory loss Psych: Endorses positive mood Heme/lymph/immuno: denies bruises, abnormal bleeding   PAST MEDICAL HISTORY:  Active Ambulatory Problems    Diagnosis Date Noted   Chronic pain of left knee 12/23/2018   Family history of rheumatoid arthritis 12/23/2018   CAP (community acquired pneumonia) 08/02/2020   Brain fog 11/23/2020   Murmur, cardiac 11/23/2020   Breast density 11/23/2020   Acute respiratory failure with hypoxia ([facility]) 01/17/2021   Lung mass 01/17/2021   Hyponatremia 01/17/2021   Elevated troponin 01/17/2021   Hyperglycemia 01/17/2021   Small cell carcinoma of right lung ([facility]) 01/24/2021   Encounter for antineoplastic chemotherapy 01/24/2021   SCLC (small cell lung carcinoma), right ([facility]) 02/07/2021   Encounter for antineoplastic immunotherapy 02/07/2021   Chemotherapy induced neutropenia ([facility]) 02/20/2021   Brain metastases ([facility]) 05/29/2021   Resolved Ambulatory Problems    Diagnosis Date Noted   No Resolved Ambulatory Problems   Past Medical History:  Diagnosis Date   Arthritis    Lung cancer ([facility])     SOCIAL HX:  Social History   Tobacco Use   Smoking status: Former    Packs/day: 0.25    Years: 35.00     Pack years: 8.75    Types: Cigarettes    Quit date: 08/29/2020    Years since quitting: 0.7   Smokeless tobacco: Never  Substance Use Topics   Alcohol use: Yes    Comment: Beer 6 daily. Recenlty went to 2-3 dialy     FAMILY HX:  Family History  Problem Relation Age of Onset   Arthritis/Rheumatoid Mother    Diabetes Father    Hodgkin's lymphoma Father    Colon cancer Neg Hx    Colon polyps Neg Hx    Esophageal cancer Neg Hx    Rectal cancer Neg Hx    Stomach cancer Neg Hx       ALLERGIES: No Known Allergies    PERTINENT MEDICATIONS:  Outpatient Encounter Medications as of 06/04/2021  Medication Sig   Ascorbic Acid (VITAMIN C PO) Take 1,000 mg by mouth daily.   benzonatate (TESSALON) 100 MG capsule Take 1 capsule (100 mg total) by mouth 3 (three) times daily as needed for cough. (Patient not taking: Reported on 05/29/2021)   calcium carbonate (OSCAL) 1500 (600 Ca) MG TABS tablet Take by mouth daily with breakfast.   LORazepam (ATIVAN) 0.5 MG tablet 1 tab po 30 minutes prior to radiation or MRI scans (Patient not taking: Reported on 05/29/2021)   meloxicam (MOBIC) 15 MG tablet Take 1 tablet (15 mg total) by mouth daily. (Patient not taking: Reported on  05/29/2021)   memantine (NAMENDA) 10 MG tablet Take 1 tablet (10 mg total) by mouth 2 (two) times daily. (Patient not taking: Reported on 05/29/2021)   memantine (NAMENDA) 5 MG tablet Begin this prescription the first day of brain radiation. Week 1: take one tablet po qam. Week 2: take one tablet qam and qpm. Week 3: take two tablets qam, and one tablet po q pm. Week 4: take two tablets qam and qpm. Fill subsequent prescription q month. (Patient not taking: Reported on 05/29/2021)   Multiple Vitamin (MULTIVITAMIN) tablet Take 1 tablet by mouth daily.   OVER THE COUNTER MEDICATION Balance of Nature supplement   temazepam (RESTORIL) 15 MG capsule Take 1 capsule (15 mg total) by mouth at bedtime as needed for sleep. (Patient not taking: Reported on  05/29/2021)   Vitamin D, Cholecalciferol, 25 MCG (1000 UT) TABS Take 1,000 Units by mouth daily.   No facility-administered encounter medications on file as of 06/04/2021.     Thank you for the opportunity to participate in the care of Ms. Pollio.  The palliative care team will continue to follow. Please call our office at [phone] if we can be of additional assistance.   Note: Portions of this note were generated with Lobbyist. Dictation errors may occur despite best attempts at proofreading.  Teodoro Spray, NP

## 2021-06-05 ENCOUNTER — Other Ambulatory Visit: Payer: Self-pay

## 2021-06-05 ENCOUNTER — Ambulatory Visit: Payer: Medicare HMO

## 2021-06-05 DIAGNOSIS — C3491 Malignant neoplasm of unspecified part of right bronchus or lung: Secondary | ICD-10-CM

## 2021-06-06 ENCOUNTER — Inpatient Hospital Stay: Payer: Medicare HMO | Admitting: Nurse Practitioner

## 2021-06-06 ENCOUNTER — Ambulatory Visit: Payer: Medicare HMO

## 2021-06-06 ENCOUNTER — Other Ambulatory Visit: Payer: Self-pay

## 2021-06-06 ENCOUNTER — Inpatient Hospital Stay (HOSPITAL_BASED_OUTPATIENT_CLINIC_OR_DEPARTMENT_OTHER): Payer: Medicare HMO | Admitting: Internal Medicine

## 2021-06-06 ENCOUNTER — Inpatient Hospital Stay: Payer: Medicare HMO | Admitting: Physician Assistant

## 2021-06-06 ENCOUNTER — Inpatient Hospital Stay: Payer: Medicare HMO

## 2021-06-06 VITALS — BP 114/79 | HR 108 | Temp 96.5°F | Resp 19 | Ht 63.0 in | Wt 127.0 lb

## 2021-06-06 VITALS — HR 100

## 2021-06-06 DIAGNOSIS — C3491 Malignant neoplasm of unspecified part of right bronchus or lung: Secondary | ICD-10-CM

## 2021-06-06 DIAGNOSIS — Z5111 Encounter for antineoplastic chemotherapy: Secondary | ICD-10-CM

## 2021-06-06 DIAGNOSIS — Z5112 Encounter for antineoplastic immunotherapy: Secondary | ICD-10-CM

## 2021-06-06 DIAGNOSIS — C3431 Malignant neoplasm of lower lobe, right bronchus or lung: Secondary | ICD-10-CM | POA: Diagnosis not present

## 2021-06-06 DIAGNOSIS — C7931 Secondary malignant neoplasm of brain: Secondary | ICD-10-CM | POA: Diagnosis not present

## 2021-06-06 DIAGNOSIS — Z79899 Other long term (current) drug therapy: Secondary | ICD-10-CM | POA: Diagnosis not present

## 2021-06-06 DIAGNOSIS — C3490 Malignant neoplasm of unspecified part of unspecified bronchus or lung: Secondary | ICD-10-CM

## 2021-06-06 LAB — CBC WITH DIFFERENTIAL (CANCER CENTER ONLY)
Abs Immature Granulocytes: 0.02 10*3/uL (ref 0.00–0.07)
Basophils Absolute: 0 10*3/uL (ref 0.0–0.1)
Basophils Relative: 1 %
Eosinophils Absolute: 0.1 10*3/uL (ref 0.0–0.5)
Eosinophils Relative: 3 %
HCT: 36.3 % (ref 36.0–46.0)
Hemoglobin: 11.8 g/dL — ABNORMAL LOW (ref 12.0–15.0)
Immature Granulocytes: 1 %
Lymphocytes Relative: 20 %
Lymphs Abs: 0.8 10*3/uL (ref 0.7–4.0)
MCH: 32.5 pg (ref 26.0–34.0)
MCHC: 32.5 g/dL (ref 30.0–36.0)
MCV: 100 fL (ref 80.0–100.0)
Monocytes Absolute: 0.5 10*3/uL (ref 0.1–1.0)
Monocytes Relative: 12 %
Neutro Abs: 2.4 10*3/uL (ref 1.7–7.7)
Neutrophils Relative %: 63 %
Platelet Count: 313 10*3/uL (ref 150–400)
RBC: 3.63 MIL/uL — ABNORMAL LOW (ref 3.87–5.11)
RDW: 14.1 % (ref 11.5–15.5)
WBC Count: 3.8 10*3/uL — ABNORMAL LOW (ref 4.0–10.5)
nRBC: 0 % (ref 0.0–0.2)

## 2021-06-06 LAB — CMP (CANCER CENTER ONLY)
ALT: 14 U/L (ref 0–44)
AST: 28 U/L (ref 15–41)
Albumin: 4.3 g/dL (ref 3.5–5.0)
Alkaline Phosphatase: 71 U/L (ref 38–126)
Anion gap: 8 (ref 5–15)
BUN: 26 mg/dL — ABNORMAL HIGH (ref 8–23)
CO2: 26 mmol/L (ref 22–32)
Calcium: 9.4 mg/dL (ref 8.9–10.3)
Chloride: 101 mmol/L (ref 98–111)
Creatinine: 0.74 mg/dL (ref 0.44–1.00)
GFR, Estimated: >60 mL/min (ref >60)
Glucose, Bld: 86 mg/dL (ref 70–99)
Potassium: 4.2 mmol/L (ref 3.5–5.1)
Sodium: 135 mmol/L (ref 135–145)
Total Bilirubin: 0.3 mg/dL (ref 0.3–1.2)
Total Protein: 7 g/dL (ref 6.5–8.1)

## 2021-06-06 MED ORDER — TRILACICLIB DIHYDROCHLORIDE INJECTION 300 MG
240.0000 mg/m2 | Freq: Once | INTRAVENOUS | Status: AC
  Administered 2021-06-06: 360 mg via INTRAVENOUS
  Filled 2021-06-06: qty 24

## 2021-06-06 MED ORDER — PALONOSETRON HCL INJECTION 0.25 MG/5ML
0.2500 mg | Freq: Once | INTRAVENOUS | Status: AC
  Administered 2021-06-06: 0.25 mg via INTRAVENOUS
  Filled 2021-06-06: qty 5

## 2021-06-06 MED ORDER — SODIUM CHLORIDE 0.9 % IV SOLN
348.0000 mg | Freq: Once | INTRAVENOUS | Status: AC
  Administered 2021-06-06: 350 mg via INTRAVENOUS
  Filled 2021-06-06: qty 35

## 2021-06-06 MED ORDER — SODIUM CHLORIDE 0.9 % IV SOLN
1500.0000 mg | Freq: Once | INTRAVENOUS | Status: AC
  Administered 2021-06-06: 1500 mg via INTRAVENOUS
  Filled 2021-06-06: qty 30

## 2021-06-06 MED ORDER — SODIUM CHLORIDE 0.9 % IV SOLN
10.0000 mg | Freq: Once | INTRAVENOUS | Status: AC
  Administered 2021-06-06: 10 mg via INTRAVENOUS
  Filled 2021-06-06: qty 10

## 2021-06-06 MED ORDER — SODIUM CHLORIDE 0.9 % IV SOLN
150.0000 mg | Freq: Once | INTRAVENOUS | Status: AC
  Administered 2021-06-06: 150 mg via INTRAVENOUS
  Filled 2021-06-06: qty 150

## 2021-06-06 MED ORDER — SODIUM CHLORIDE 0.9 % IV SOLN
100.0000 mg/m2 | Freq: Once | INTRAVENOUS | Status: AC
  Administered 2021-06-06: 150 mg via INTRAVENOUS
  Filled 2021-06-06: qty 7.5

## 2021-06-06 MED ORDER — SODIUM CHLORIDE 0.9 % IV SOLN: Freq: Once | INTRAVENOUS | Status: AC

## 2021-06-06 NOTE — Patient Instructions (Signed)
[facility]  Discharge Instructions: Thank you for choosing [facility] to provide your oncology and hematology care.   If you have a lab appointment with the [facility], please go directly to the [facility] and check in at the registration area.   Wear comfortable clothing and clothing appropriate for easy access to any Portacath or PICC line.   We strive to give you quality time with your provider. You may need to reschedule your appointment if you arrive late (15 or more minutes).  Arriving late affects you and other patients whose appointments are after yours.  Also, if you miss three or more appointments without notifying the office, you may be dismissed from the clinic at the providers discretion.      For prescription refill requests, have your pharmacy contact our office and allow 72 hours for refills to be completed.    Today you received the following chemotherapy and/or immunotherapy agents: Durvalumab, Carboplatin, and Etoposide      To help prevent nausea and vomiting after your treatment, we encourage you to take your nausea medication as directed.  BELOW ARE SYMPTOMS THAT SHOULD BE REPORTED IMMEDIATELY: *FEVER GREATER THAN 100.4 F (38 C) OR HIGHER *CHILLS OR SWEATING *NAUSEA AND VOMITING THAT IS NOT CONTROLLED WITH YOUR NAUSEA MEDICATION *UNUSUAL SHORTNESS OF BREATH *UNUSUAL BRUISING OR BLEEDING *URINARY PROBLEMS (pain or burning when urinating, or frequent urination) *BOWEL PROBLEMS (unusual diarrhea, constipation, pain near the anus) TENDERNESS IN MOUTH AND THROAT WITH OR WITHOUT PRESENCE OF ULCERS (sore throat, sores in mouth, or a toothache) UNUSUAL RASH, SWELLING OR PAIN  UNUSUAL VAGINAL DISCHARGE OR ITCHING   Items with * indicate a potential emergency and should be followed up as soon as possible or go to the Emergency Department if any problems should occur.  Please show the CHEMOTHERAPY ALERT CARD or  IMMUNOTHERAPY ALERT CARD at check-in to the Emergency Department and triage nurse.  Should you have questions after your visit or need to cancel or reschedule your appointment, please contact [facility]  Dept: [phone]  and follow the prompts.  Office hours are 8:00 a.m. to 4:30 p.m. Monday - Friday. Please note that voicemails left after 4:00 p.m. may not be returned until the following business day.  We are closed weekends and major holidays. You have access to a nurse at all times for urgent questions. Please call the main number to the clinic Dept: [phone] and follow the prompts.   For any non-urgent questions, you may also contact your provider using MyChart. We now offer e-Visits for anyone 70 and older to request care online for non-urgent symptoms. For details visit [email or web address].   Also download the MyChart app! Go to the app store, search "MyChart", open the app, select [facility], and log in with your MyChart username and password.  Due to Covid, a mask is required upon entering the hospital/clinic. If you do not have a mask, one will be given to you upon arrival. For doctor visits, patients may have 1 support person aged 25 or older with them. For treatment visits, patients cannot have anyone with them due to current Covid guidelines and our immunocompromised population.

## 2021-06-06 NOTE — Progress Notes (Signed)
[facility] [phone]   [phone]  OFFICE PROGRESS NOTE  Michela Pitcher, NP [place] [address]  DIAGNOSIS: Extensive stage (T2 a, N2, M1b) small cell lung cancer diagnosed in September 2022 and presented with bulky right hilar mass with mediastinal lymphadenopathy and occlusion of the distal right main bronchus and right lower and middle lobe bronchi by the mass lesion with few subcentimeter brain metastasis.  PRIOR THERAPY: Systemic chemotherapy with cisplatin 80 Mg/M2 on day 1 and etoposide 100 Mg/M2 on days 1, 2 and 3 status post 1 cycle.  This treatment was discontinued after the patient was found to have brain metastasis.  CURRENT THERAPY: Systemic chemotherapy with carboplatin for AUC of 5 on day 1, etoposide 100 Mg/M2 on days 1, 2 and 3 with Cosela before chemotherapy as well as Imfinzi 1500 Mg IV every 3 weeks.  First dose February 20, 2021.  Status post 3 cycles.  INTERVAL HISTORY: Christy Kelley 71 y.o. female right to the clinic today for follow-up visit accompanied by her husband.  The patient is feeling fine today with no concerning complaints.  She denied having any current chest pain, shortness of breath, cough or hemoptysis.  She denied having any fever or chills.  She has no nausea, vomiting, diarrhea or constipation.  She has no headache or visual changes.  She denied having any significant weight loss or night sweats.  She tolerated the last cycle of her chemotherapy fairly well.  She had MRI of the brain performed recently that showed improvement in the brain metastasis and her whole brain irradiation is currently on hold until there is evidence for progression in the brain.  She is here today for reevaluation before resuming her treatment.  MEDICAL HISTORY: Past Medical History:  Diagnosis Date   Arthritis    Lung cancer ([facility])    Dx 9/27 with lung cancer    ALLERGIES:  has No Known  Allergies.  MEDICATIONS:  Current Outpatient Medications  Medication Sig Dispense Refill   Ascorbic Acid (VITAMIN C PO) Take 1,000 mg by mouth daily.     benzonatate (TESSALON) 100 MG capsule Take 1 capsule (100 mg total) by mouth 3 (three) times daily as needed for cough. (Patient not taking: Reported on 05/29/2021) 30 capsule 0   calcium carbonate (OSCAL) 1500 (600 Ca) MG TABS tablet Take by mouth daily with breakfast.     LORazepam (ATIVAN) 0.5 MG tablet 1 tab po 30 minutes prior to radiation or MRI scans (Patient not taking: Reported on 05/29/2021) 30 tablet 0   meloxicam (MOBIC) 15 MG tablet Take 1 tablet (15 mg total) by mouth daily. (Patient not taking: Reported on 05/29/2021) 30 tablet 2   memantine (NAMENDA) 10 MG tablet Take 1 tablet (10 mg total) by mouth 2 (two) times daily. (Patient not taking: Reported on 05/29/2021) 60 tablet 4   memantine (NAMENDA) 5 MG tablet Begin this prescription the first day of brain radiation. Week 1: take one tablet po qam. Week 2: take one tablet qam and qpm. Week 3: take two tablets qam, and one tablet po q pm. Week 4: take two tablets qam and qpm. Fill subsequent prescription q month. (Patient not taking: Reported on 05/29/2021) 70 tablet 0   Multiple Vitamin (MULTIVITAMIN) tablet Take 1 tablet by mouth daily.     OVER THE COUNTER MEDICATION Balance of Nature supplement     temazepam (RESTORIL) 15 MG capsule Take 1 capsule (  15 mg total) by mouth at bedtime as needed for sleep. (Patient not taking: Reported on 05/29/2021) 30 capsule 0   Vitamin D, Cholecalciferol, 25 MCG (1000 UT) TABS Take 1,000 Units by mouth daily.     No current facility-administered medications for this visit.    SURGICAL HISTORY:  Past Surgical History:  Procedure Laterality Date   BRONCHIAL WASHINGS  01/18/2021   Procedure: BRONCHIAL WASHINGS;  Surgeon: Julian Hy, DO;  Location: [facility];  Service: Endoscopy;;   ENDOBRONCHIAL ULTRASOUND N/A 01/18/2021   Procedure: ENDOBRONCHIAL  ULTRASOUND;  Surgeon: Julian Hy, DO;  Location: [facility];  Service: Endoscopy;  Laterality: N/A;   FINE NEEDLE ASPIRATION  01/18/2021   Procedure: FINE NEEDLE ASPIRATION (FNA) LINEAR;  Surgeon: Julian Hy, DO;  Location: [facility];  Service: Endoscopy;;   VIDEO BRONCHOSCOPY N/A 01/18/2021   Procedure: VIDEO BRONCHOSCOPY WITHOUT FLUORO;  Surgeon: Julian Hy, DO;  Location: [facility];  Service: Endoscopy;  Laterality: N/A;    REVIEW OF SYSTEMS:  Constitutional: positive for fatigue Eyes: negative Ears, nose, mouth, throat, and face: negative Respiratory: negative Cardiovascular: negative Gastrointestinal: negative Genitourinary:negative Integument/breast: negative Hematologic/lymphatic: negative Musculoskeletal:negative Neurological: negative Behavioral/Psych: negative Endocrine: negative Allergic/Immunologic: negative   PHYSICAL EXAMINATION: General appearance: alert, cooperative, fatigued, and no distress Head: Normocephalic, without obvious abnormality, atraumatic Neck: no adenopathy, no JVD, supple, symmetrical, trachea midline, and thyroid not enlarged, symmetric, no tenderness/mass/nodules Lymph nodes: Cervical, supraclavicular, and axillary nodes normal. Resp: clear to auscultation bilaterally Back: symmetric, no curvature. ROM normal. No CVA tenderness. Cardio: regular rate and rhythm, S1, S2 normal, no murmur, click, rub or gallop GI: soft, non-tender; bowel sounds normal; no masses,  no organomegaly Extremities: extremities normal, atraumatic, no cyanosis or edema Neurologic: Alert and oriented X 3, normal strength and tone. Normal symmetric reflexes. Normal coordination and gait  ECOG PERFORMANCE STATUS: 1 - Symptomatic but completely ambulatory  Blood pressure 114/79, pulse (!) 108, temperature (!) 96.5 F (35.8 C), temperature source Tympanic, resp. rate 19, height 5\' 3"  (1.6 m), weight 127 lb (57.6 kg), SpO2 99 %.  LABORATORY DATA: Lab Results   Component Value Date   WBC 3.8 (L) 06/06/2021   HGB 11.8 (L) 06/06/2021   HCT 36.3 06/06/2021   MCV 100.0 06/06/2021   PLT 313 06/06/2021      Chemistry      Component Value Date/Time   NA 141 05/29/2021 1238   K 4.3 05/29/2021 1238   CL 106 05/29/2021 1238   CO2 28 05/29/2021 1238   BUN 19 05/29/2021 1238   CREATININE 0.62 05/29/2021 1238      Component Value Date/Time   CALCIUM 9.5 05/29/2021 1238   ALKPHOS 84 05/29/2021 1238   AST 22 05/29/2021 1238   ALT 11 05/29/2021 1238   BILITOT 0.3 05/29/2021 1238       RADIOGRAPHIC STUDIES: MR Brain W Wo Contrast  Addendum Date: 05/28/2021   ADDENDUM REPORT: 05/28/2021 08:23 ADDENDUM: Probable punctate subacute infarct of the right frontal white matter. Electronically Signed   By: Macy Mis M.D.   On: 05/28/2021 08:23   Result Date: 05/28/2021 CLINICAL DATA:  Brain/CNS neoplasm, staging; metastatic lung cancer EXAM: MRI HEAD WITHOUT AND WITH CONTRAST TECHNIQUE: Multiplanar, multiecho pulse sequences of the brain and surrounding structures were obtained without and with intravenous contrast. CONTRAST:  32mL GADAVIST GADOBUTROL 1 MMOL/ML IV SOLN COMPARISON:  04/13/2021 FINDINGS: Brain: None of the previously identified foci of contrast enhancement are definitely identified. There is a punctate focus  of mild diffusion hyperintensity with ADC isointensity in the right frontal white matter. There is no intracranial mass, mass effect, or edema. Ventricles are stable in size. There is no hydrocephalus or extra-axial fluid collection. Patchy probable mild chronic microvascular ischemic changes in the cerebral white matter again identified. No new abnormal enhancement. Vascular: Major vessel flow voids at the skull base are preserved. Skull and upper cervical spine: Normal marrow signal is preserved. Sinuses/Orbits: Paranasal sinuses are aerated. Orbits are unremarkable. Other: Sella is unremarkable.  Mastoid air cells are clear. IMPRESSION:  Suboptimal valuation due to motion degradation. However, none of the previously visualized enhancing lesions are definitely identified. No new abnormal enhancement. Electronically Signed: By: Macy Mis M.D. On: 05/28/2021 08:20    ASSESSMENT AND PLAN: This is a very pleasant 71 years old white female recently diagnosed with extensive stage (T2 a, N2, M1b) small cell lung cancer presented with bulky right hilar mass and mediastinal lymphadenopathy with occlusion of the distal right mainstem bronchus and right lower lobe bronchus as well as new subcentimeter brain metastasis diagnosed in September 2022. The patient started systemic chemotherapy with cisplatin and etoposide last week and tolerated the first week of her treatment fairly well except for increasing fatigue and weakness as well as diarrhea, lack of appetite and weight loss. Unfortunately the staging work-up showed multiple subcentimeter brain metastasis.  The PET scan showed no evidence of metastatic disease outside the previously known areas in the chest. She is here today for evaluation before starting the first cycle of palliative systemic chemotherapy but changing the regimen to carboplatin for AUC of 5 on day 1 and etoposide 100 Mg/M2 on days 1, 2 and 3 with Cosela 240 Mg/M2 before chemotherapy as well as Imfinzi 1500 Mg IV every 3 weeks on day one of the chemotherapy.  Status post 3 cycles of this regimen. She has been tolerating this treatment fairly well recently. We will plan to hold her systemic therapy with cycle #4 in preparation for whole brain irradiation but the MRI of the brain showed significant improvement in her disease in the brain with the chemotherapy. I recommended for the patient to finish this course of systemic chemotherapy with cycle #4 today with a combination of chemotherapy and immunotherapy. I will arrange for her to have repeat CT scan of the chest, abdomen pelvis in 3 weeks for restaging of her disease before  switching to maintenance immunotherapy. The patient and her husband agreed to proceed with the plan. For the lack of appetite, the patient will continue her current treatment with Megace ES.. For the insomnia, she will continue her current treatment with Restoril. She was advised to call immediately if she has any other concerning symptoms in the interval. The patient voices understanding of current disease status and treatment options and is in agreement with the current care plan.  All questions were answered. The patient knows to call the clinic with any problems, questions or concerns. We can certainly see the patient much sooner if necessary.  The total time spent in the appointment was 35 minutes.  Disclaimer: This note was dictated with voice recognition software. Similar sounding words can inadvertently be transcribed and may not be corrected upon review.

## 2021-06-07 ENCOUNTER — Other Ambulatory Visit: Payer: Self-pay

## 2021-06-07 ENCOUNTER — Inpatient Hospital Stay: Payer: Medicare HMO | Admitting: Nurse Practitioner

## 2021-06-07 ENCOUNTER — Ambulatory Visit: Payer: Medicare HMO

## 2021-06-07 ENCOUNTER — Inpatient Hospital Stay: Payer: Medicare HMO

## 2021-06-07 VITALS — BP 108/70 | HR 97 | Temp 98.4°F | Resp 18

## 2021-06-07 DIAGNOSIS — C3491 Malignant neoplasm of unspecified part of right bronchus or lung: Secondary | ICD-10-CM

## 2021-06-07 DIAGNOSIS — Z79899 Other long term (current) drug therapy: Secondary | ICD-10-CM | POA: Diagnosis not present

## 2021-06-07 DIAGNOSIS — Z5111 Encounter for antineoplastic chemotherapy: Secondary | ICD-10-CM | POA: Diagnosis not present

## 2021-06-07 DIAGNOSIS — C3431 Malignant neoplasm of lower lobe, right bronchus or lung: Secondary | ICD-10-CM | POA: Diagnosis not present

## 2021-06-07 DIAGNOSIS — C7931 Secondary malignant neoplasm of brain: Secondary | ICD-10-CM | POA: Diagnosis not present

## 2021-06-07 MED ORDER — SODIUM CHLORIDE 0.9 % IV SOLN
10.0000 mg | Freq: Once | INTRAVENOUS | Status: AC
  Administered 2021-06-07: 10 mg via INTRAVENOUS
  Filled 2021-06-07: qty 10

## 2021-06-07 MED ORDER — SODIUM CHLORIDE 0.9 % IV SOLN
100.0000 mg/m2 | Freq: Once | INTRAVENOUS | Status: AC
  Administered 2021-06-07: 150 mg via INTRAVENOUS
  Filled 2021-06-07: qty 7.5

## 2021-06-07 MED ORDER — TRILACICLIB DIHYDROCHLORIDE INJECTION 300 MG
240.0000 mg/m2 | Freq: Once | INTRAVENOUS | Status: AC
  Administered 2021-06-07: 360 mg via INTRAVENOUS
  Filled 2021-06-07: qty 24

## 2021-06-07 MED ORDER — SODIUM CHLORIDE 0.9 % IV SOLN: Freq: Once | INTRAVENOUS | Status: AC

## 2021-06-07 MED FILL — Dexamethasone Sodium Phosphate Inj 100 MG/10ML: INTRAMUSCULAR | Qty: 1 | Status: AC

## 2021-06-07 NOTE — Patient Instructions (Signed)
[facility]  Discharge Instructions: Thank you for choosing [facility] to provide your oncology and hematology care.   If you have a lab appointment with the [facility], please go directly to the [facility] and check in at the registration area.   Wear comfortable clothing and clothing appropriate for easy access to any Portacath or PICC line.   We strive to give you quality time with your provider. You may need to reschedule your appointment if you arrive late (15 or more minutes).  Arriving late affects you and other patients whose appointments are after yours.  Also, if you miss three or more appointments without notifying the office, you may be dismissed from the clinic at the providers discretion.      For prescription refill requests, have your pharmacy contact our office and allow 72 hours for refills to be completed.    Today you received the following chemotherapy and/or immunotherapy agents: Cosela and Etoposide      To help prevent nausea and vomiting after your treatment, we encourage you to take your nausea medication as directed.  BELOW ARE SYMPTOMS THAT SHOULD BE REPORTED IMMEDIATELY: *FEVER GREATER THAN 100.4 F (38 C) OR HIGHER *CHILLS OR SWEATING *NAUSEA AND VOMITING THAT IS NOT CONTROLLED WITH YOUR NAUSEA MEDICATION *UNUSUAL SHORTNESS OF BREATH *UNUSUAL BRUISING OR BLEEDING *URINARY PROBLEMS (pain or burning when urinating, or frequent urination) *BOWEL PROBLEMS (unusual diarrhea, constipation, pain near the anus) TENDERNESS IN MOUTH AND THROAT WITH OR WITHOUT PRESENCE OF ULCERS (sore throat, sores in mouth, or a toothache) UNUSUAL RASH, SWELLING OR PAIN  UNUSUAL VAGINAL DISCHARGE OR ITCHING   Items with * indicate a potential emergency and should be followed up as soon as possible or go to the Emergency Department if any problems should occur.  Please show the CHEMOTHERAPY ALERT CARD or IMMUNOTHERAPY ALERT CARD at  check-in to the Emergency Department and triage nurse.  Should you have questions after your visit or need to cancel or reschedule your appointment, please contact [facility]  Dept: [phone]  and follow the prompts.  Office hours are 8:00 a.m. to 4:30 p.m. Monday - Friday. Please note that voicemails left after 4:00 p.m. may not be returned until the following business day.  We are closed weekends and major holidays. You have access to a nurse at all times for urgent questions. Please call the main number to the clinic Dept: [phone] and follow the prompts.   For any non-urgent questions, you may also contact your provider using MyChart. We now offer e-Visits for anyone 89 and older to request care online for non-urgent symptoms. For details visit [email or web address].   Also download the MyChart app! Go to the app store, search "MyChart", open the app, select [facility], and log in with your MyChart username and password.  Due to Covid, a mask is required upon entering the hospital/clinic. If you do not have a mask, one will be given to you upon arrival. For doctor visits, patients may have 1 support person aged 24 or older with them. For treatment visits, patients cannot have anyone with them due to current Covid guidelines and our immunocompromised population.

## 2021-06-08 ENCOUNTER — Inpatient Hospital Stay: Payer: Medicare HMO | Admitting: Nurse Practitioner

## 2021-06-08 ENCOUNTER — Inpatient Hospital Stay: Payer: Medicare HMO

## 2021-06-08 ENCOUNTER — Other Ambulatory Visit: Payer: Self-pay

## 2021-06-08 ENCOUNTER — Ambulatory Visit: Payer: Medicare HMO

## 2021-06-08 VITALS — BP 103/63 | HR 98 | Temp 97.9°F | Resp 18

## 2021-06-08 DIAGNOSIS — Z79899 Other long term (current) drug therapy: Secondary | ICD-10-CM | POA: Diagnosis not present

## 2021-06-08 DIAGNOSIS — C3431 Malignant neoplasm of lower lobe, right bronchus or lung: Secondary | ICD-10-CM | POA: Diagnosis not present

## 2021-06-08 DIAGNOSIS — C7931 Secondary malignant neoplasm of brain: Secondary | ICD-10-CM | POA: Diagnosis not present

## 2021-06-08 DIAGNOSIS — C3491 Malignant neoplasm of unspecified part of right bronchus or lung: Secondary | ICD-10-CM

## 2021-06-08 DIAGNOSIS — Z5111 Encounter for antineoplastic chemotherapy: Secondary | ICD-10-CM | POA: Diagnosis not present

## 2021-06-08 MED ORDER — SODIUM CHLORIDE 0.9 % IV SOLN: Freq: Once | INTRAVENOUS | Status: AC

## 2021-06-08 MED ORDER — SODIUM CHLORIDE 0.9 % IV SOLN
10.0000 mg | Freq: Once | INTRAVENOUS | Status: AC
  Administered 2021-06-08: 10 mg via INTRAVENOUS
  Filled 2021-06-08: qty 10

## 2021-06-08 MED ORDER — TRILACICLIB DIHYDROCHLORIDE INJECTION 300 MG
240.0000 mg/m2 | Freq: Once | INTRAVENOUS | Status: AC
  Administered 2021-06-08: 360 mg via INTRAVENOUS
  Filled 2021-06-08: qty 24

## 2021-06-08 MED ORDER — SODIUM CHLORIDE 0.9 % IV SOLN
100.0000 mg/m2 | Freq: Once | INTRAVENOUS | Status: AC
  Administered 2021-06-08: 150 mg via INTRAVENOUS
  Filled 2021-06-08: qty 7.5

## 2021-06-08 NOTE — Patient Instructions (Signed)
[facility]  Discharge Instructions: Thank you for choosing [facility] to provide your oncology and hematology care.   If you have a lab appointment with the [facility], please go directly to the [facility] and check in at the registration area.   Wear comfortable clothing and clothing appropriate for easy access to any Portacath or PICC line.   We strive to give you quality time with your provider. You may need to reschedule your appointment if you arrive late (15 or more minutes).  Arriving late affects you and other patients whose appointments are after yours.  Also, if you miss three or more appointments without notifying the office, you may be dismissed from the clinic at the providers discretion.      For prescription refill requests, have your pharmacy contact our office and allow 72 hours for refills to be completed.    Today you received the following chemotherapy and/or immunotherapy agents: Cosela and Etoposide      To help prevent nausea and vomiting after your treatment, we encourage you to take your nausea medication as directed.  BELOW ARE SYMPTOMS THAT SHOULD BE REPORTED IMMEDIATELY: *FEVER GREATER THAN 100.4 F (38 C) OR HIGHER *CHILLS OR SWEATING *NAUSEA AND VOMITING THAT IS NOT CONTROLLED WITH YOUR NAUSEA MEDICATION *UNUSUAL SHORTNESS OF BREATH *UNUSUAL BRUISING OR BLEEDING *URINARY PROBLEMS (pain or burning when urinating, or frequent urination) *BOWEL PROBLEMS (unusual diarrhea, constipation, pain near the anus) TENDERNESS IN MOUTH AND THROAT WITH OR WITHOUT PRESENCE OF ULCERS (sore throat, sores in mouth, or a toothache) UNUSUAL RASH, SWELLING OR PAIN  UNUSUAL VAGINAL DISCHARGE OR ITCHING   Items with * indicate a potential emergency and should be followed up as soon as possible or go to the Emergency Department if any problems should occur.  Please show the CHEMOTHERAPY ALERT CARD or IMMUNOTHERAPY ALERT CARD at  check-in to the Emergency Department and triage nurse.  Should you have questions after your visit or need to cancel or reschedule your appointment, please contact [facility]  Dept: [phone]  and follow the prompts.  Office hours are 8:00 a.m. to 4:30 p.m. Monday - Friday. Please note that voicemails left after 4:00 p.m. may not be returned until the following business day.  We are closed weekends and major holidays. You have access to a nurse at all times for urgent questions. Please call the main number to the clinic Dept: [phone] and follow the prompts.   For any non-urgent questions, you may also contact your provider using MyChart. We now offer e-Visits for anyone 35 and older to request care online for non-urgent symptoms. For details visit [email or web address].   Also download the MyChart app! Go to the app store, search "MyChart", open the app, select [facility], and log in with your MyChart username and password.  Due to Covid, a mask is required upon entering the hospital/clinic. If you do not have a mask, one will be given to you upon arrival. For doctor visits, patients may have 1 support person aged 2 or older with them. For treatment visits, patients cannot have anyone with them due to current Covid guidelines and our immunocompromised population.

## 2021-06-09 ENCOUNTER — Other Ambulatory Visit: Payer: Self-pay | Admitting: Radiation Oncology

## 2021-06-11 ENCOUNTER — Encounter: Payer: Self-pay | Admitting: Internal Medicine

## 2021-06-11 ENCOUNTER — Ambulatory Visit: Payer: Medicare HMO

## 2021-06-11 ENCOUNTER — Inpatient Hospital Stay: Payer: Medicare HMO

## 2021-06-11 ENCOUNTER — Other Ambulatory Visit: Payer: Self-pay

## 2021-06-11 VITALS — BP 113/81 | HR 100 | Temp 98.0°F | Resp 20

## 2021-06-11 DIAGNOSIS — Z5111 Encounter for antineoplastic chemotherapy: Secondary | ICD-10-CM | POA: Diagnosis not present

## 2021-06-11 DIAGNOSIS — C3491 Malignant neoplasm of unspecified part of right bronchus or lung: Secondary | ICD-10-CM

## 2021-06-11 DIAGNOSIS — Z79899 Other long term (current) drug therapy: Secondary | ICD-10-CM | POA: Diagnosis not present

## 2021-06-11 DIAGNOSIS — C3431 Malignant neoplasm of lower lobe, right bronchus or lung: Secondary | ICD-10-CM | POA: Diagnosis not present

## 2021-06-11 DIAGNOSIS — C7931 Secondary malignant neoplasm of brain: Secondary | ICD-10-CM | POA: Diagnosis not present

## 2021-06-11 LAB — CMP (CANCER CENTER ONLY)
ALT: 12 U/L (ref 0–44)
AST: 23 U/L (ref 15–41)
Albumin: 4 g/dL (ref 3.5–5.0)
Alkaline Phosphatase: 67 U/L (ref 38–126)
Anion gap: 6 (ref 5–15)
BUN: 18 mg/dL (ref 8–23)
CO2: 30 mmol/L (ref 22–32)
Calcium: 9.2 mg/dL (ref 8.9–10.3)
Chloride: 102 mmol/L (ref 98–111)
Creatinine: 0.64 mg/dL (ref 0.44–1.00)
GFR, Estimated: >60 mL/min (ref >60)
Glucose, Bld: 93 mg/dL (ref 70–99)
Potassium: 4.2 mmol/L (ref 3.5–5.1)
Sodium: 138 mmol/L (ref 135–145)
Total Bilirubin: 0.2 mg/dL — ABNORMAL LOW (ref 0.3–1.2)
Total Protein: 6.2 g/dL — ABNORMAL LOW (ref 6.5–8.1)

## 2021-06-11 LAB — CBC WITH DIFFERENTIAL (CANCER CENTER ONLY)
Abs Immature Granulocytes: 0.01 10*3/uL (ref 0.00–0.07)
Basophils Absolute: 0 10*3/uL (ref 0.0–0.1)
Basophils Relative: 1 %
Eosinophils Absolute: 0.2 10*3/uL (ref 0.0–0.5)
Eosinophils Relative: 7 %
HCT: 32.4 % — ABNORMAL LOW (ref 36.0–46.0)
Hemoglobin: 10.6 g/dL — ABNORMAL LOW (ref 12.0–15.0)
Immature Granulocytes: 0 %
Lymphocytes Relative: 26 %
Lymphs Abs: 0.8 10*3/uL (ref 0.7–4.0)
MCH: 32.2 pg (ref 26.0–34.0)
MCHC: 32.7 g/dL (ref 30.0–36.0)
MCV: 98.5 fL (ref 80.0–100.0)
Monocytes Absolute: 0.1 10*3/uL (ref 0.1–1.0)
Monocytes Relative: 4 %
Neutro Abs: 1.9 10*3/uL (ref 1.7–7.7)
Neutrophils Relative %: 62 %
Platelet Count: 251 10*3/uL (ref 150–400)
RBC: 3.29 MIL/uL — ABNORMAL LOW (ref 3.87–5.11)
RDW: 13.5 % (ref 11.5–15.5)
WBC Count: 3 10*3/uL — ABNORMAL LOW (ref 4.0–10.5)
nRBC: 0 % (ref 0.0–0.2)

## 2021-06-11 MED ORDER — PEGFILGRASTIM-CBQV 6 MG/0.6ML ~~LOC~~ SOSY
6.0000 mg | PREFILLED_SYRINGE | Freq: Once | SUBCUTANEOUS | Status: AC
  Administered 2021-06-11: 6 mg via SUBCUTANEOUS
  Filled 2021-06-11: qty 0.6

## 2021-06-11 NOTE — Progress Notes (Signed)
erroneous

## 2021-06-12 ENCOUNTER — Ambulatory Visit: Payer: Medicare HMO

## 2021-06-13 ENCOUNTER — Inpatient Hospital Stay: Payer: Medicare HMO

## 2021-06-13 ENCOUNTER — Ambulatory Visit: Payer: Medicare HMO

## 2021-06-14 ENCOUNTER — Other Ambulatory Visit: Payer: Self-pay | Admitting: Radiation Oncology

## 2021-06-14 ENCOUNTER — Inpatient Hospital Stay: Payer: Medicare HMO | Admitting: Nurse Practitioner

## 2021-06-14 ENCOUNTER — Ambulatory Visit: Payer: Medicare HMO

## 2021-06-15 ENCOUNTER — Telehealth: Payer: Self-pay | Admitting: Internal Medicine

## 2021-06-15 NOTE — Telephone Encounter (Signed)
Called patient regarding upcoming March and April appointments, spoke with patient's spouse. Patient will be notified of appointment.

## 2021-06-18 ENCOUNTER — Other Ambulatory Visit: Payer: Self-pay | Admitting: Radiation Therapy

## 2021-06-18 ENCOUNTER — Encounter: Payer: Self-pay | Admitting: Internal Medicine

## 2021-06-20 ENCOUNTER — Other Ambulatory Visit: Payer: Self-pay

## 2021-06-20 ENCOUNTER — Inpatient Hospital Stay: Payer: Medicare HMO | Attending: Internal Medicine

## 2021-06-20 ENCOUNTER — Ambulatory Visit (HOSPITAL_COMMUNITY)
Admission: RE | Admit: 2021-06-20 | Discharge: 2021-06-20 | Disposition: A | Discharge status: Home / Self Care | Payer: Medicare HMO | Source: Ambulatory Visit | Attending: Internal Medicine | Admitting: Internal Medicine

## 2021-06-20 ENCOUNTER — Encounter (HOSPITAL_COMMUNITY): Payer: Self-pay

## 2021-06-20 DIAGNOSIS — Z5181 Encounter for therapeutic drug level monitoring: Secondary | ICD-10-CM | POA: Insufficient documentation

## 2021-06-20 DIAGNOSIS — Z79899 Other long term (current) drug therapy: Secondary | ICD-10-CM | POA: Insufficient documentation

## 2021-06-20 DIAGNOSIS — J432 Centrilobular emphysema: Secondary | ICD-10-CM | POA: Diagnosis not present

## 2021-06-20 DIAGNOSIS — C3431 Malignant neoplasm of lower lobe, right bronchus or lung: Secondary | ICD-10-CM | POA: Insufficient documentation

## 2021-06-20 DIAGNOSIS — C3490 Malignant neoplasm of unspecified part of unspecified bronchus or lung: Secondary | ICD-10-CM | POA: Diagnosis not present

## 2021-06-20 DIAGNOSIS — R911 Solitary pulmonary nodule: Secondary | ICD-10-CM | POA: Diagnosis not present

## 2021-06-20 DIAGNOSIS — Z5111 Encounter for antineoplastic chemotherapy: Secondary | ICD-10-CM | POA: Insufficient documentation

## 2021-06-20 DIAGNOSIS — R5382 Chronic fatigue, unspecified: Secondary | ICD-10-CM | POA: Diagnosis not present

## 2021-06-20 DIAGNOSIS — K7689 Other specified diseases of liver: Secondary | ICD-10-CM | POA: Diagnosis not present

## 2021-06-20 DIAGNOSIS — C7931 Secondary malignant neoplasm of brain: Secondary | ICD-10-CM | POA: Insufficient documentation

## 2021-06-20 DIAGNOSIS — C3491 Malignant neoplasm of unspecified part of right bronchus or lung: Secondary | ICD-10-CM

## 2021-06-20 LAB — CBC WITH DIFFERENTIAL (CANCER CENTER ONLY)
Abs Immature Granulocytes: 0.3 10*3/uL — ABNORMAL HIGH (ref 0.00–0.07)
Basophils Absolute: 0.1 10*3/uL (ref 0.0–0.1)
Basophils Relative: 2 %
Eosinophils Absolute: 0.1 10*3/uL (ref 0.0–0.5)
Eosinophils Relative: 2 %
HCT: 33.4 % — ABNORMAL LOW (ref 36.0–46.0)
Hemoglobin: 11 g/dL — ABNORMAL LOW (ref 12.0–15.0)
Immature Granulocytes: 5 %
Lymphocytes Relative: 12 %
Lymphs Abs: 0.8 10*3/uL (ref 0.7–4.0)
MCH: 32.5 pg (ref 26.0–34.0)
MCHC: 32.9 g/dL (ref 30.0–36.0)
MCV: 98.8 fL (ref 80.0–100.0)
Monocytes Absolute: 0.5 10*3/uL (ref 0.1–1.0)
Monocytes Relative: 8 %
Neutro Abs: 4.6 10*3/uL (ref 1.7–7.7)
Neutrophils Relative %: 71 %
Platelet Count: 175 10*3/uL (ref 150–400)
RBC: 3.38 MIL/uL — ABNORMAL LOW (ref 3.87–5.11)
RDW: 15.1 % (ref 11.5–15.5)
WBC Count: 6.5 10*3/uL (ref 4.0–10.5)
nRBC: 0 % (ref 0.0–0.2)

## 2021-06-20 LAB — CMP (CANCER CENTER ONLY)
ALT: 10 U/L (ref 0–44)
AST: 23 U/L (ref 15–41)
Albumin: 4.4 g/dL (ref 3.5–5.0)
Alkaline Phosphatase: 105 U/L (ref 38–126)
Anion gap: 7 (ref 5–15)
BUN: 15 mg/dL (ref 8–23)
CO2: 29 mmol/L (ref 22–32)
Calcium: 9.6 mg/dL (ref 8.9–10.3)
Chloride: 104 mmol/L (ref 98–111)
Creatinine: 0.66 mg/dL (ref 0.44–1.00)
GFR, Estimated: >60 mL/min (ref >60)
Glucose, Bld: 91 mg/dL (ref 70–99)
Potassium: 3.6 mmol/L (ref 3.5–5.1)
Sodium: 140 mmol/L (ref 135–145)
Total Bilirubin: 0.2 mg/dL — ABNORMAL LOW (ref 0.3–1.2)
Total Protein: 6.9 g/dL (ref 6.5–8.1)

## 2021-06-20 LAB — TSH: TSH: <0.08 u[IU]/mL — ABNORMAL LOW (ref 0.308–3.960)

## 2021-06-20 MED ORDER — SODIUM CHLORIDE (PF) 0.9 % IJ SOLN
INTRAMUSCULAR | Status: AC
  Filled 2021-06-20: qty 50

## 2021-06-20 MED ORDER — IOHEXOL 300 MG/ML  SOLN
100.0000 mL | Freq: Once | INTRAMUSCULAR | Status: AC | PRN
  Administered 2021-06-20: 100 mL via INTRAVENOUS

## 2021-06-22 ENCOUNTER — Other Ambulatory Visit (HOSPITAL_COMMUNITY): Payer: Medicare HMO

## 2021-06-26 ENCOUNTER — Other Ambulatory Visit: Payer: Self-pay

## 2021-06-26 ENCOUNTER — Inpatient Hospital Stay: Payer: Medicare HMO

## 2021-06-26 ENCOUNTER — Encounter: Payer: Self-pay | Admitting: Internal Medicine

## 2021-06-26 ENCOUNTER — Inpatient Hospital Stay (HOSPITAL_BASED_OUTPATIENT_CLINIC_OR_DEPARTMENT_OTHER): Payer: Medicare HMO | Admitting: Internal Medicine

## 2021-06-26 VITALS — HR 92

## 2021-06-26 VITALS — BP 125/74 | HR 101 | Temp 97.0°F | Resp 18 | Wt 126.4 lb

## 2021-06-26 DIAGNOSIS — Z79899 Other long term (current) drug therapy: Secondary | ICD-10-CM | POA: Diagnosis not present

## 2021-06-26 DIAGNOSIS — C3491 Malignant neoplasm of unspecified part of right bronchus or lung: Secondary | ICD-10-CM

## 2021-06-26 DIAGNOSIS — C7931 Secondary malignant neoplasm of brain: Secondary | ICD-10-CM | POA: Diagnosis not present

## 2021-06-26 DIAGNOSIS — Z5112 Encounter for antineoplastic immunotherapy: Secondary | ICD-10-CM

## 2021-06-26 DIAGNOSIS — C3431 Malignant neoplasm of lower lobe, right bronchus or lung: Secondary | ICD-10-CM | POA: Diagnosis not present

## 2021-06-26 DIAGNOSIS — Z5111 Encounter for antineoplastic chemotherapy: Secondary | ICD-10-CM | POA: Diagnosis not present

## 2021-06-26 LAB — CBC WITH DIFFERENTIAL (CANCER CENTER ONLY)
Abs Immature Granulocytes: 0.02 10*3/uL (ref 0.00–0.07)
Basophils Absolute: 0 10*3/uL (ref 0.0–0.1)
Basophils Relative: 1 %
Eosinophils Absolute: 0.2 10*3/uL (ref 0.0–0.5)
Eosinophils Relative: 5 %
HCT: 30.3 % — ABNORMAL LOW (ref 36.0–46.0)
Hemoglobin: 10.3 g/dL — ABNORMAL LOW (ref 12.0–15.0)
Immature Granulocytes: 1 %
Lymphocytes Relative: 25 %
Lymphs Abs: 0.8 10*3/uL (ref 0.7–4.0)
MCH: 33 pg (ref 26.0–34.0)
MCHC: 34 g/dL (ref 30.0–36.0)
MCV: 97.1 fL (ref 80.0–100.0)
Monocytes Absolute: 0.4 10*3/uL (ref 0.1–1.0)
Monocytes Relative: 13 %
Neutro Abs: 1.7 10*3/uL (ref 1.7–7.7)
Neutrophils Relative %: 55 %
Platelet Count: 312 10*3/uL (ref 150–400)
RBC: 3.12 MIL/uL — ABNORMAL LOW (ref 3.87–5.11)
RDW: 14.9 % (ref 11.5–15.5)
Smear Review: NORMAL
WBC Count: 3.1 10*3/uL — ABNORMAL LOW (ref 4.0–10.5)
nRBC: 0 % (ref 0.0–0.2)

## 2021-06-26 LAB — CMP (CANCER CENTER ONLY)
ALT: 10 U/L (ref 0–44)
AST: 22 U/L (ref 15–41)
Albumin: 4 g/dL (ref 3.5–5.0)
Alkaline Phosphatase: 71 U/L (ref 38–126)
Anion gap: 7 (ref 5–15)
BUN: 16 mg/dL (ref 8–23)
CO2: 26 mmol/L (ref 22–32)
Calcium: 9.4 mg/dL (ref 8.9–10.3)
Chloride: 106 mmol/L (ref 98–111)
Creatinine: 0.57 mg/dL (ref 0.44–1.00)
GFR, Estimated: >60 mL/min (ref >60)
Glucose, Bld: 92 mg/dL (ref 70–99)
Potassium: 4.2 mmol/L (ref 3.5–5.1)
Sodium: 139 mmol/L (ref 135–145)
Total Bilirubin: 0.3 mg/dL (ref 0.3–1.2)
Total Protein: 6.4 g/dL — ABNORMAL LOW (ref 6.5–8.1)

## 2021-06-26 MED ORDER — SODIUM CHLORIDE 0.9 % IV SOLN
1500.0000 mg | Freq: Once | INTRAVENOUS | Status: AC
  Administered 2021-06-26: 1500 mg via INTRAVENOUS
  Filled 2021-06-26: qty 30

## 2021-06-26 MED ORDER — SODIUM CHLORIDE 0.9 % IV SOLN: Freq: Once | INTRAVENOUS | Status: AC

## 2021-06-26 NOTE — Progress Notes (Signed)
Per Dr. Julien Nordmann , it is okay to treat pt today with carboplatin , etoposide and Imfinzi and heart rate of 101. ?

## 2021-06-26 NOTE — Patient Instructions (Signed)
[facility]  Discharge Instructions: Thank you for choosing [facility] to provide your oncology and hematology care.   If you have a lab appointment with the [facility], please go directly to the Cancer Center and check in at the registration area.   Wear comfortable clothing and clothing appropriate for easy access to any Portacath or PICC line.   We strive to give you quality time with your provider. You may need to reschedule your appointment if you arrive late (15 or more minutes).  Arriving late affects you and other patients whose appointments are after yours.  Also, if you miss three or more appointments without notifying the office, you may be dismissed from the clinic at the provider's discretion.      For prescription refill requests, have your pharmacy contact our office and allow 72 hours for refills to be completed.    Today you received the following chemotherapy and/or immunotherapy agents Imfinzi      To help prevent nausea and vomiting after your treatment, we encourage you to take your nausea medication as directed.  BELOW ARE SYMPTOMS THAT SHOULD BE REPORTED IMMEDIATELY: *FEVER GREATER THAN 100.4 F (38 C) OR HIGHER *CHILLS OR SWEATING *NAUSEA AND VOMITING THAT IS NOT CONTROLLED WITH YOUR NAUSEA MEDICATION *UNUSUAL SHORTNESS OF BREATH *UNUSUAL BRUISING OR BLEEDING *URINARY PROBLEMS (pain or burning when urinating, or frequent urination) *BOWEL PROBLEMS (unusual diarrhea, constipation, pain near the anus) TENDERNESS IN MOUTH AND THROAT WITH OR WITHOUT PRESENCE OF ULCERS (sore throat, sores in mouth, or a toothache) UNUSUAL RASH, SWELLING OR PAIN  UNUSUAL VAGINAL DISCHARGE OR ITCHING   Items with * indicate a potential emergency and should be followed up as soon as possible or go to the Emergency Department if any problems should occur.  Please show the CHEMOTHERAPY ALERT CARD or IMMUNOTHERAPY ALERT CARD at check-in to the  Emergency Department and triage nurse.  Should you have questions after your visit or need to cancel or reschedule your appointment, please contact [facility]  Dept: [phone]  and follow the prompts.  Office hours are 8:00 a.m. to 4:30 p.m. Monday - Friday. Please note that voicemails left after 4:00 p.m. may not be returned until the following business day.  We are closed weekends and major holidays. You have access to a nurse at all times for urgent questions. Please call the main number to the clinic Dept: [phone] and follow the prompts.   For any non-urgent questions, you may also contact your provider using MyChart. We now offer e-Visits for anyone 18 and older to request care online for non-urgent symptoms. For details visit [email or web address].   Also download the MyChart app! Go to the app store, search "MyChart", open the app, select [facility], and log in with your MyChart username and password.  Due to Covid, a mask is required upon entering the hospital/clinic. If you do not have a mask, one will be given to you upon arrival. For doctor visits, patients may have 1 support person aged 18 or older with them. For treatment visits, patients cannot have anyone with them due to current Covid guidelines and our immunocompromised population.   

## 2021-06-26 NOTE — Progress Notes (Signed)
Nurse notified  

## 2021-06-26 NOTE — Progress Notes (Signed)
[facility] [phone]   [phone]) [phone]  OFFICE PROGRESS NOTE  Michela Pitcher, NP [place] [address]  DIAGNOSIS: Extensive stage (T2 a, N2, M1b) small cell lung cancer diagnosed in September 2022 and presented with bulky right hilar mass with mediastinal lymphadenopathy and occlusion of the distal right main bronchus and right lower and middle lobe bronchi by the mass lesion with few subcentimeter brain metastasis.  PRIOR THERAPY: Systemic chemotherapy with cisplatin 80 Mg/M2 on day 1 and etoposide 100 Mg/M2 on days 1, 2 and 3 status post 1 cycle.  This treatment was discontinued after the patient was found to have brain metastasis.  CURRENT THERAPY: Systemic chemotherapy with carboplatin for AUC of 5 on day 1, etoposide 100 Mg/M2 on days 1, 2 and 3 with Cosela before chemotherapy as well as Imfinzi 1500 Mg IV every 3 weeks.  First dose February 20, 2021.  Status post 4 cycles.  Starting from cycle #5 the patient will be on maintenance treatment with Imfinzi 1500 Mg IV every 4 weeks.  INTERVAL HISTORY: Christy Kelley 71 y.o. female returns to the clinic today for follow-up visit accompanied by her husband.  The patient is feeling fine today with no concerning complaints.  She denied having any chest pain, shortness of breath, cough or hemoptysis.  She denied having any nausea, vomiting, diarrhea or constipation.  She has foggy brain recently probably from the chemotherapy but also the patient has known history of brain metastasis.  She denied having any recent weight loss or night sweats.  She tolerated the last cycle of her treatment well except for the foggy thinking.  She had repeat CT scan of the chest, abdomen pelvis performed recently and she is here for evaluation and discussion of her scan results.  MEDICAL HISTORY: Past Medical History:  Diagnosis Date   Arthritis    Lung cancer ([place])    Dx 9/27 with lung cancer     ALLERGIES:  has No Known Allergies.  MEDICATIONS:  Current Outpatient Medications  Medication Sig Dispense Refill   Ascorbic Acid (VITAMIN C PO) Take 1,000 mg by mouth daily.     benzonatate (TESSALON) 100 MG capsule Take 1 capsule (100 mg total) by mouth 3 (three) times daily as needed for cough. (Patient not taking: Reported on 05/29/2021) 30 capsule 0   calcium carbonate (OSCAL) 1500 (600 Ca) MG TABS tablet Take by mouth daily with breakfast.     LORazepam (ATIVAN) 0.5 MG tablet 1 tab po 30 minutes prior to radiation or MRI scans (Patient not taking: Reported on 05/29/2021) 30 tablet 0   meloxicam (MOBIC) 15 MG tablet Take 1 tablet (15 mg total) by mouth daily. (Patient not taking: Reported on 05/29/2021) 30 tablet 2   memantine (NAMENDA) 10 MG tablet Take 1 tablet (10 mg total) by mouth 2 (two) times daily. (Patient not taking: Reported on 05/29/2021) 60 tablet 4   memantine (NAMENDA) 5 MG tablet Begin this prescription the first day of brain radiation. Week 1: take one tablet po qam. Week 2: take one tablet qam and qpm. Week 3: take two tablets qam, and one tablet po q pm. Week 4: take two tablets qam and qpm. Fill subsequent prescription q month. (Patient not taking: Reported on 05/29/2021) 70 tablet 0   Multiple Vitamin (MULTIVITAMIN) tablet Take 1 tablet by mouth daily.     OVER THE COUNTER MEDICATION Balance of Nature supplement  temazepam (RESTORIL) 15 MG capsule Take 1 capsule (15 mg total) by mouth at bedtime as needed for sleep. (Patient not taking: Reported on 05/29/2021) 30 capsule 0   Vitamin D, Cholecalciferol, 25 MCG (1000 UT) TABS Take 1,000 Units by mouth daily.     No current facility-administered medications for this visit.    SURGICAL HISTORY:  Past Surgical History:  Procedure Laterality Date   BRONCHIAL WASHINGS  01/18/2021   Procedure: BRONCHIAL WASHINGS;  Surgeon: Julian Hy, DO;  Location: [facility];  Service: Endoscopy;;   ENDOBRONCHIAL ULTRASOUND N/A  01/18/2021   Procedure: ENDOBRONCHIAL ULTRASOUND;  Surgeon: Julian Hy, DO;  Location: [facility];  Service: Endoscopy;  Laterality: N/A;   FINE NEEDLE ASPIRATION  01/18/2021   Procedure: FINE NEEDLE ASPIRATION (FNA) LINEAR;  Surgeon: Julian Hy, DO;  Location: [facility];  Service: Endoscopy;;   VIDEO BRONCHOSCOPY N/A 01/18/2021   Procedure: VIDEO BRONCHOSCOPY WITHOUT FLUORO;  Surgeon: Julian Hy, DO;  Location: [facility];  Service: Endoscopy;  Laterality: N/A;    REVIEW OF SYSTEMS:  Constitutional: positive for fatigue Eyes: negative Ears, nose, mouth, throat, and face: negative Respiratory: negative Cardiovascular: negative Gastrointestinal: negative Genitourinary:negative Integument/breast: negative Hematologic/lymphatic: negative Musculoskeletal:negative Neurological: positive for confusion at times Behavioral/Psych: negative Endocrine: negative Allergic/Immunologic: negative   PHYSICAL EXAMINATION: General appearance: alert, cooperative, fatigued, and no distress Head: Normocephalic, without obvious abnormality, atraumatic Neck: no adenopathy, no JVD, supple, symmetrical, trachea midline, and thyroid not enlarged, symmetric, no tenderness/mass/nodules Lymph nodes: Cervical, supraclavicular, and axillary nodes normal. Resp: clear to auscultation bilaterally Back: symmetric, no curvature. ROM normal. No CVA tenderness. Cardio: regular rate and rhythm, S1, S2 normal, no murmur, click, rub or gallop GI: soft, non-tender; bowel sounds normal; no masses,  no organomegaly Extremities: extremities normal, atraumatic, no cyanosis or edema Neurologic: Alert and oriented X 3, normal strength and tone. Normal symmetric reflexes. Normal coordination and gait  ECOG PERFORMANCE STATUS: 1 - Symptomatic but completely ambulatory  Blood pressure 125/74, pulse (!) 101, temperature (!) 97 F (36.1 C), temperature source Tympanic, resp. rate 18, weight 126 lb 7 oz (57.4 kg),  SpO2 99 %.  LABORATORY DATA: Lab Results  Component Value Date   WBC 3.1 (L) 06/26/2021   HGB 10.3 (L) 06/26/2021   HCT 30.3 (L) 06/26/2021   MCV 97.1 06/26/2021   PLT 312 06/26/2021      Chemistry      Component Value Date/Time   NA 140 06/20/2021 1131   K 3.6 06/20/2021 1131   CL 104 06/20/2021 1131   CO2 29 06/20/2021 1131   BUN 15 06/20/2021 1131   CREATININE 0.66 06/20/2021 1131      Component Value Date/Time   CALCIUM 9.6 06/20/2021 1131   ALKPHOS 105 06/20/2021 1131   AST 23 06/20/2021 1131   ALT 10 06/20/2021 1131   BILITOT 0.2 (L) 06/20/2021 1131       RADIOGRAPHIC STUDIES: CT Chest W Contrast  Result Date: 06/21/2021 CLINICAL DATA:  Primary Cancer Type: Lung Imaging Indication: Assess response to therapy Interval therapy since last imaging? Yes Initial Cancer Diagnosis Date: 01/18/2021; Established by: Biopsy-proven Detailed Pathology: Extensive stage small cell lung cancer. Primary Tumor location: Right hilum. Surgeries: No. Chemotherapy: Yes; Ongoing? Yes; Most recent administration: 06/06/2021 Immunotherapy?  Yes; Type: Imfinzi; Ongoing? Yes Radiation therapy? Yes; Date Range: 02/05/2021 - 02/26/2021; Target: Right lung EXAM: CT CHEST, ABDOMEN, AND PELVIS WITH CONTRAST TECHNIQUE: Multidetector CT imaging of the chest, abdomen and pelvis was performed following the standard  protocol during bolus administration of intravenous contrast. RADIATION DOSE REDUCTION: This exam was performed according to the departmental dose-optimization program which includes automated exposure control, adjustment of the mA and/or kV according to patient size and/or use of iterative reconstruction technique. CONTRAST:  158mL OMNIPAQUE IOHEXOL 300 MG/ML  SOLN COMPARISON:  Most recent CT chest, abdomen and pelvis 04/13/2021. 01/29/2021 PET-CT. FINDINGS: CT CHEST FINDINGS Cardiovascular: The heart size is normal. No substantial pericardial effusion. Coronary artery calcification is evident.  Mitral annular calcification evident. Mild atherosclerotic calcification is noted in the wall of the thoracic aorta. Mediastinum/Nodes: No mediastinal lymphadenopathy. There is no hilar lymphadenopathy. The esophagus has normal imaging features. There is no axillary lymphadenopathy. Lungs/Pleura: Centrilobular emphsyema noted. Right upper lobe post treatment scarring is similar to minimally progressive in the interval. Stable volume loss. 6 mm nodule medial left upper lobe is unchanged on 61/6. There is no new suspicious pulmonary nodule or mass. No focal airspace consolidation. There is no evidence of pleural effusion. Musculoskeletal: No worrisome lytic or sclerotic osseous abnormality. CT ABDOMEN PELVIS FINDINGS Hepatobiliary: Tiny hypodensity in the left liver is stable, most likely benign and likely a cyst. Similar additional scattered tiny hypoattenuating lesions are also unchanged. There is no evidence for gallstones, gallbladder wall thickening, or pericholecystic fluid. No intrahepatic or extrahepatic biliary dilation. Pancreas: No focal mass lesion. No dilatation of the main duct. No intraparenchymal cyst. No peripancreatic edema. Spleen: No splenomegaly. No focal mass lesion. Adrenals/Urinary Tract: No adrenal nodule or mass. Kidneys unremarkable. No evidence for hydroureter. The urinary bladder appears normal for the degree of distention. Stomach/Bowel: Stomach is unremarkable. No gastric wall thickening. No evidence of outlet obstruction. Duodenum is normally positioned as is the ligament of Treitz. No small bowel wall thickening. No small bowel dilatation. The terminal ileum is normal. The appendix is not well visualized, but there is no edema or inflammation in the region of the cecum. No gross colonic mass. No colonic wall thickening. Vascular/Lymphatic: There is mild atherosclerotic calcification of the abdominal aorta without aneurysm. There is no gastrohepatic or hepatoduodenal ligament  lymphadenopathy. No retroperitoneal or mesenteric lymphadenopathy. No pelvic sidewall lymphadenopathy. Reproductive: The uterus is unremarkable. Similar appearance of the lobular calcified lesion in the left adnexal space measuring 7.5 x 4.8 cm today compared to 6.2 x 5.4 cm previously. Measurement differentiation likely related to lobular contour. Other: Trace free fluid posterior right adnexal space. Musculoskeletal: No worrisome lytic or sclerotic osseous abnormality. IMPRESSION: 1. Stable to minimally progressive post treatment scarring in the right upper lobe. No findings on today's study to raise concern for local recurrence or metastatic disease. 2. Stable 6 mm left upper lobe pulmonary nodule. 3. Stable appearance of the lobular calcified left adnexal lesion. As noted previously, this finding was not hypermetabolic on PET imaging. Broad ligament fibroid or benign ovarian lesion would be considerations. 4. Aortic Atherosclerosis (ICD10-I70.0) and Emphysema (ICD10-J43.9). Electronically Signed   By: Misty Stanley M.D.   On: 06/21/2021 10:32   MR Brain W Wo Contrast  Addendum Date: 05/28/2021   ADDENDUM REPORT: 05/28/2021 08:23 ADDENDUM: Probable punctate subacute infarct of the right frontal white matter. Electronically Signed   By: Macy Mis M.D.   On: 05/28/2021 08:23   Result Date: 05/28/2021 CLINICAL DATA:  Brain/CNS neoplasm, staging; metastatic lung cancer EXAM: MRI HEAD WITHOUT AND WITH CONTRAST TECHNIQUE: Multiplanar, multiecho pulse sequences of the brain and surrounding structures were obtained without and with intravenous contrast. CONTRAST:  78mL GADAVIST GADOBUTROL 1 MMOL/ML IV SOLN COMPARISON:  04/13/2021 FINDINGS: Brain: None of the previously identified foci of contrast enhancement are definitely identified. There is a punctate focus of mild diffusion hyperintensity with ADC isointensity in the right frontal white matter. There is no intracranial mass, mass effect, or edema. Ventricles  are stable in size. There is no hydrocephalus or extra-axial fluid collection. Patchy probable mild chronic microvascular ischemic changes in the cerebral white matter again identified. No new abnormal enhancement. Vascular: Major vessel flow voids at the skull base are preserved. Skull and upper cervical spine: Normal marrow signal is preserved. Sinuses/Orbits: Paranasal sinuses are aerated. Orbits are unremarkable. Other: Sella is unremarkable.  Mastoid air cells are clear. IMPRESSION: Suboptimal valuation due to motion degradation. However, none of the previously visualized enhancing lesions are definitely identified. No new abnormal enhancement. Electronically Signed: By: Macy Mis M.D. On: 05/28/2021 08:20   CT Abdomen Pelvis W Contrast  Result Date: 06/21/2021 CLINICAL DATA:  Primary Cancer Type: Lung Imaging Indication: Assess response to therapy Interval therapy since last imaging? Yes Initial Cancer Diagnosis Date: 01/18/2021; Established by: Biopsy-proven Detailed Pathology: Extensive stage small cell lung cancer. Primary Tumor location: Right hilum. Surgeries: No. Chemotherapy: Yes; Ongoing? Yes; Most recent administration: 06/06/2021 Immunotherapy?  Yes; Type: Imfinzi; Ongoing? Yes Radiation therapy? Yes; Date Range: 02/05/2021 - 02/26/2021; Target: Right lung EXAM: CT CHEST, ABDOMEN, AND PELVIS WITH CONTRAST TECHNIQUE: Multidetector CT imaging of the chest, abdomen and pelvis was performed following the standard protocol during bolus administration of intravenous contrast. RADIATION DOSE REDUCTION: This exam was performed according to the departmental dose-optimization program which includes automated exposure control, adjustment of the mA and/or kV according to patient size and/or use of iterative reconstruction technique. CONTRAST:  173mL OMNIPAQUE IOHEXOL 300 MG/ML  SOLN COMPARISON:  Most recent CT chest, abdomen and pelvis 04/13/2021. 01/29/2021 PET-CT. FINDINGS: CT CHEST FINDINGS  Cardiovascular: The heart size is normal. No substantial pericardial effusion. Coronary artery calcification is evident. Mitral annular calcification evident. Mild atherosclerotic calcification is noted in the wall of the thoracic aorta. Mediastinum/Nodes: No mediastinal lymphadenopathy. There is no hilar lymphadenopathy. The esophagus has normal imaging features. There is no axillary lymphadenopathy. Lungs/Pleura: Centrilobular emphsyema noted. Right upper lobe post treatment scarring is similar to minimally progressive in the interval. Stable volume loss. 6 mm nodule medial left upper lobe is unchanged on 61/6. There is no new suspicious pulmonary nodule or mass. No focal airspace consolidation. There is no evidence of pleural effusion. Musculoskeletal: No worrisome lytic or sclerotic osseous abnormality. CT ABDOMEN PELVIS FINDINGS Hepatobiliary: Tiny hypodensity in the left liver is stable, most likely benign and likely a cyst. Similar additional scattered tiny hypoattenuating lesions are also unchanged. There is no evidence for gallstones, gallbladder wall thickening, or pericholecystic fluid. No intrahepatic or extrahepatic biliary dilation. Pancreas: No focal mass lesion. No dilatation of the main duct. No intraparenchymal cyst. No peripancreatic edema. Spleen: No splenomegaly. No focal mass lesion. Adrenals/Urinary Tract: No adrenal nodule or mass. Kidneys unremarkable. No evidence for hydroureter. The urinary bladder appears normal for the degree of distention. Stomach/Bowel: Stomach is unremarkable. No gastric wall thickening. No evidence of outlet obstruction. Duodenum is normally positioned as is the ligament of Treitz. No small bowel wall thickening. No small bowel dilatation. The terminal ileum is normal. The appendix is not well visualized, but there is no edema or inflammation in the region of the cecum. No gross colonic mass. No colonic wall thickening. Vascular/Lymphatic: There is mild  atherosclerotic calcification of the abdominal aorta without aneurysm. There is no gastrohepatic  or hepatoduodenal ligament lymphadenopathy. No retroperitoneal or mesenteric lymphadenopathy. No pelvic sidewall lymphadenopathy. Reproductive: The uterus is unremarkable. Similar appearance of the lobular calcified lesion in the left adnexal space measuring 7.5 x 4.8 cm today compared to 6.2 x 5.4 cm previously. Measurement differentiation likely related to lobular contour. Other: Trace free fluid posterior right adnexal space. Musculoskeletal: No worrisome lytic or sclerotic osseous abnormality. IMPRESSION: 1. Stable to minimally progressive post treatment scarring in the right upper lobe. No findings on today's study to raise concern for local recurrence or metastatic disease. 2. Stable 6 mm left upper lobe pulmonary nodule. 3. Stable appearance of the lobular calcified left adnexal lesion. As noted previously, this finding was not hypermetabolic on PET imaging. Broad ligament fibroid or benign ovarian lesion would be considerations. 4. Aortic Atherosclerosis (ICD10-I70.0) and Emphysema (ICD10-J43.9). Electronically Signed   By: Misty Stanley M.D.   On: 06/21/2021 10:32    ASSESSMENT AND PLAN: This is a very pleasant 71 years old white female recently diagnosed with extensive stage (T2 a, N2, M1b) small cell lung cancer presented with bulky right hilar mass and mediastinal lymphadenopathy with occlusion of the distal right mainstem bronchus and right lower lobe bronchus as well as new subcentimeter brain metastasis diagnosed in September 2022. The patient started systemic chemotherapy with cisplatin and etoposide last week and tolerated the first week of her treatment fairly well except for increasing fatigue and weakness as well as diarrhea, lack of appetite and weight loss. Unfortunately the staging work-up showed multiple subcentimeter brain metastasis.  The PET scan showed no evidence of metastatic disease  outside the previously known areas in the chest. She is here today for evaluation before starting the first cycle of palliative systemic chemotherapy but changing the regimen to carboplatin for AUC of 5 on day 1 and etoposide 100 Mg/M2 on days 1, 2 and 3 with Cosela 240 Mg/M2 before chemotherapy as well as Imfinzi 1500 Mg IV every 3 weeks on day one of the chemotherapy.  Status post 4 cycles of this regimen.  Starting from cycle #5 she will be on maintenance treatment with Imfinzi 1500 Mg IV every 4 weeks. The patient tolerated the last cycle of her treatment with chemotherapy and Imfinzi fairly well except for the occasional confusion. She had repeat CT scan of the chest, abdomen pelvis performed recently.  I personally and independently reviewed the scans and discussed the result with the patient and her husband. Her scan showed stable disease with no concerning findings for progression. I recommended for her to proceed with maintenance treatment with immunotherapy with Imfinzi every 4 weeks and she will proceed with cycle #5 today. For the history of brain metastasis, she is scheduled to have repeat MRI of the brain in 1 months.  She may benefit from whole brain irradiation if she has any evidence of progression in the brain. The patient will come back for follow-up visit in 4 weeks before the next cycle of her treatment. She was advised to call immediately if she has any other concerning symptoms in the interval. The patient voices understanding of current disease status and treatment options and is in agreement with the current care plan.  All questions were answered. The patient knows to call the clinic with any problems, questions or concerns. We can certainly see the patient much sooner if necessary.  Disclaimer: This note was dictated with voice recognition software. Similar sounding words can inadvertently be transcribed and may not be corrected upon review.

## 2021-06-27 ENCOUNTER — Other Ambulatory Visit: Payer: Medicare HMO

## 2021-07-04 ENCOUNTER — Other Ambulatory Visit: Payer: Medicare HMO

## 2021-07-04 ENCOUNTER — Ambulatory Visit: Payer: Medicare HMO | Admitting: Physician Assistant

## 2021-07-04 ENCOUNTER — Ambulatory Visit: Payer: Medicare HMO

## 2021-07-05 ENCOUNTER — Ambulatory Visit: Payer: Medicare HMO

## 2021-07-06 ENCOUNTER — Ambulatory Visit: Payer: Medicare HMO

## 2021-07-10 ENCOUNTER — Other Ambulatory Visit: Payer: Self-pay | Admitting: Radiation Oncology

## 2021-07-10 NOTE — Progress Notes (Signed)
Chronic fatigue  ?Monitoring for immunotherapy mediated hypothyroidism ?

## 2021-07-11 ENCOUNTER — Other Ambulatory Visit: Payer: Medicare HMO

## 2021-07-18 ENCOUNTER — Other Ambulatory Visit: Payer: Medicare HMO

## 2021-07-24 NOTE — Progress Notes (Signed)
[place] ?OFFICE PROGRESS NOTE ? ?Michela Pitcher, NP ?[place] ?[place] [address] ? ?DIAGNOSIS: Extensive stage (T2 a, N2, M1b) small cell lung cancer diagnosed in September 2022 and presented with bulky right hilar mass with mediastinal lymphadenopathy and occlusion of the distal right main bronchus and right lower and middle lobe bronchi by the mass lesion with few subcentimeter brain metastasis. ? ?PRIOR THERAPY:  ?1) Systemic chemotherapy with cisplatin 80 Mg/M2 on day 1 and etoposide 100 Mg/M2 on days 1, 2 and 3 status post 1 cycle.  This treatment was discontinued after the patient was found to have brain metastasis. ? ? ?CURRENT THERAPY:  Systemic chemotherapy with carboplatin for AUC of 5 on day 1, etoposide 100 Mg/M2 on days 1, 2 and 3 with Cosela before chemotherapy as well as Imfinzi 1500 Mg IV every 3 weeks.  First dose February 20, 2021.  Status post 5 cycles.  Starting from cycle #5 the patient will be on maintenance treatment with Imfinzi 1500 Mg IV every 4 weeks. ? ?INTERVAL HISTORY: ?Christy Kelley 71 y.o. female returns to the clinic today for a follow-up visit.  The patient is feeling fairly well today without any concerning complaints except she has some memory/cognitive impairment. She is accompanied by her husband today.  She is tolerating her single agent immunotherapy with Imfinzi well without any concerning adverse side effects. They when for a walk yesterday without having any dyspnea on exertion or fatigue. Today she denies any fever, chills, night sweats, or unexplained weight loss. She reportedly has a very strong appetite.  She denies any chest pain, shortness of breath, cough, or hemoptysis.  Denies any nausea or vomiting. She reportedly has some stool incontinence and needs to wear depends. She reports this is improving but has been present since starting treatment. Her imaging studies do not show any osseous or lumbar/spinal involvement. She denies any  headache or visual changes but reports she has "foggy" brain.  She had a repeat brain MRI scheduled for 07/27/2021 and a follow-up visit with Dr. Mickeal Skinner afterwards. She was prescribed Namenda when she was going to undergo radiation to the brain; however, she did not proceed with whole brain radiation due to her cognitive decline unless she has visible disease in the brain. Her initial 11 subcm brain lesions that were no longer visible after undergoing chemo/radiaion. She is here today for evaluation and repeat blood work before starting cycle #6. ? ?MEDICAL HISTORY: ?Past Medical History:  ?Diagnosis Date  ? Arthritis   ? Lung cancer ([place])   ? Dx 9/27 with lung cancer  ? ? ?ALLERGIES:  has No Known Allergies. ? ?MEDICATIONS:  ?Current Outpatient Medications  ?Medication Sig Dispense Refill  ? Ascorbic Acid (VITAMIN C PO) Take 1,000 mg by mouth daily.    ? benzonatate (TESSALON) 100 MG capsule Take 1 capsule (100 mg total) by mouth 3 (three) times daily as needed for cough. (Patient not taking: Reported on 05/29/2021) 30 capsule 0  ? calcium carbonate (OSCAL) 1500 (600 Ca) MG TABS tablet Take by mouth daily with breakfast.    ? LORazepam (ATIVAN) 0.5 MG tablet 1 tab po 30 minutes prior to radiation or MRI scans 30 tablet 0  ? meloxicam (MOBIC) 15 MG tablet Take 1 tablet (15 mg total) by mouth daily. 30 tablet 2  ? memantine (NAMENDA) 10 MG tablet Take 1 tablet (10 mg total) by mouth 2 (two) times daily. (Patient not taking: Reported on 05/29/2021) 60  tablet 4  ? memantine (NAMENDA) 5 MG tablet Begin this prescription the first day of brain radiation. Week 1: take one tablet po qam. Week 2: take one tablet qam and qpm. Week 3: take two tablets qam, and one tablet po q pm. Week 4: take two tablets qam and qpm. Fill subsequent prescription q month. (Patient not taking: Reported on 05/29/2021) 70 tablet 0  ? Multiple Vitamin (MULTIVITAMIN) tablet Take 1 tablet by mouth daily.    ? OVER THE COUNTER MEDICATION Balance of Nature  supplement    ? temazepam (RESTORIL) 15 MG capsule Take 1 capsule (15 mg total) by mouth at bedtime as needed for sleep. 30 capsule 0  ? Vitamin D, Cholecalciferol, 25 MCG (1000 UT) TABS Take 1,000 Units by mouth daily.    ? ?No current facility-administered medications for this visit.  ? ? ?SURGICAL HISTORY:  ?Past Surgical History:  ?Procedure Laterality Date  ? BRONCHIAL WASHINGS  01/18/2021  ? Procedure: BRONCHIAL WASHINGS;  Surgeon: Julian Hy, DO;  Location: [facility];  Service: Endoscopy;;  ? ENDOBRONCHIAL ULTRASOUND N/A 01/18/2021  ? Procedure: ENDOBRONCHIAL ULTRASOUND;  Surgeon: Julian Hy, DO;  Location[facility];  Service: Endoscopy;  Laterality: N/A;  ? FINE NEEDLE ASPIRATION  01/18/2021  ? Procedure: FINE NEEDLE ASPIRATION (FNA) LINEAR;  Surgeon: Julian Hy, DO;  Location: [facility];  Service: Endoscopy;;  ? VIDEO BRONCHOSCOPY N/A 01/18/2021  ? Procedure: VIDEO BRONCHOSCOPY WITHOUT FLUORO;  Surgeon: Julian Hy, DO;  Location: [facility];  Service: Endoscopy;  Laterality: N/A;  ? ? ?REVIEW OF SYSTEMS:   ?Review of Systems  ?Constitutional: Negative for appetite change, chills, fatigue, fever and unexpected weight change.  ?HENT: Negative for mouth sores, nosebleeds, sore throat and trouble swallowing.   ?Eyes: Negative for eye problems and icterus.  ?Respiratory: Negative for cough, hemoptysis, shortness of breath and wheezing.   ?Cardiovascular: Negative for chest pain and leg swelling.  ?Gastrointestinal: Positive for stool incontinence. Negative for abdominal pain, constipation, diarrhea, nausea and vomiting.  ?Genitourinary: Negative for bladder incontinence, difficulty urinating, dysuria, frequency and hematuria.   ?Musculoskeletal: Negative for back pain, gait problem, neck pain and neck stiffness.  ?Skin: Negative for itching and rash.  ?Neurological: Negative for dizziness, extremity weakness, gait problem, headaches, light-headedness and seizures.  ?Hematological:  Negative for adenopathy. Does not bruise/bleed easily.  ?Psychiatric/Behavioral: Negative for confusion, depression and sleep disturbance. The patient is not nervous/anxious.   ? ? ?PHYSICAL EXAMINATION:  ?Blood pressure 118/89, pulse 92, temperature 97.9 ?F (36.6 ?C), temperature source Temporal, resp. rate 16, height 5\' 3"  (1.6 m), weight 135 lb 8 oz (61.5 kg), SpO2 100 %. ? ?ECOG PERFORMANCE STATUS: 1 ? ?Physical Exam  ?Constitutional: Oriented to person, place, and time and well-developed, well-nourished, and in no distress. ?HENT:  ?Head: Normocephalic and atraumatic.  ?Mouth/Throat: Oropharynx is clear and moist. No oropharyngeal exudate.  ?Eyes: Conjunctivae are normal. Right eye exhibits no discharge. Left eye exhibits no discharge. No scleral icterus.  ?Neck: Normal range of motion. Neck supple.  ?Cardiovascular: Normal rate, regular rhythm, normal heart sounds and intact distal pulses.   ?Pulmonary/Chest: Effort normal and breath sounds normal. No respiratory distress. No wheezes. No rales.  ?Abdominal: Soft. Bowel sounds are normal. Exhibits no distension and no mass. There is no tenderness.  ?Musculoskeletal: Normal range of motion. Exhibits no edema.  ?Lymphadenopathy:  ?  No cervical adenopathy.  ?Neurological: Alert and oriented to person, place, and time. Exhibits normal muscle tone. Gait normal. Coordination  normal.  ?Skin: Skin is warm and dry. No rash noted. Not diaphoretic. No erythema. No pallor.  ?Psychiatric: Mood and judgment normal. Has some memory deficits.  ?Vitals reviewed. ? ?LABORATORY DATA: ?Lab Results  ?Component Value Date  ? WBC 4.0 07/25/2021  ? HGB 11.3 (L) 07/25/2021  ? HCT 35.1 (L) 07/25/2021  ? MCV 98.3 07/25/2021  ? PLT 254 07/25/2021  ? ? ?  Chemistry   ?   ?Component Value Date/Time  ? NA 140 07/25/2021 0844  ? K 4.4 07/25/2021 0844  ? CL 106 07/25/2021 0844  ? CO2 27 07/25/2021 0844  ? BUN 21 07/25/2021 0844  ? CREATININE 0.63 07/25/2021 0844  ?    ?Component Value  Date/Time  ? CALCIUM 9.7 07/25/2021 0844  ? ALKPHOS 85 07/25/2021 0844  ? AST 34 07/25/2021 0844  ? ALT 26 07/25/2021 0844  ? BILITOT 0.3 07/25/2021 0844  ?  ? ? ? ?RADIOGRAPHIC STUDIES: ? ?No results found. ? ? ?ASSE

## 2021-07-25 ENCOUNTER — Inpatient Hospital Stay: Payer: Medicare HMO | Attending: Internal Medicine | Admitting: Physician Assistant

## 2021-07-25 ENCOUNTER — Other Ambulatory Visit: Payer: Self-pay

## 2021-07-25 ENCOUNTER — Other Ambulatory Visit: Payer: Medicare HMO

## 2021-07-25 ENCOUNTER — Inpatient Hospital Stay: Payer: Medicare HMO | Admitting: Internal Medicine

## 2021-07-25 ENCOUNTER — Inpatient Hospital Stay: Payer: Medicare HMO

## 2021-07-25 VITALS — BP 118/89 | HR 92 | Temp 97.9°F | Resp 16 | Ht 63.0 in | Wt 135.5 lb

## 2021-07-25 DIAGNOSIS — C3431 Malignant neoplasm of lower lobe, right bronchus or lung: Secondary | ICD-10-CM | POA: Insufficient documentation

## 2021-07-25 DIAGNOSIS — C3491 Malignant neoplasm of unspecified part of right bronchus or lung: Secondary | ICD-10-CM

## 2021-07-25 DIAGNOSIS — C7931 Secondary malignant neoplasm of brain: Secondary | ICD-10-CM | POA: Insufficient documentation

## 2021-07-25 DIAGNOSIS — Z5111 Encounter for antineoplastic chemotherapy: Secondary | ICD-10-CM | POA: Insufficient documentation

## 2021-07-25 DIAGNOSIS — Z79899 Other long term (current) drug therapy: Secondary | ICD-10-CM | POA: Insufficient documentation

## 2021-07-25 DIAGNOSIS — Z5112 Encounter for antineoplastic immunotherapy: Secondary | ICD-10-CM

## 2021-07-25 LAB — CMP (CANCER CENTER ONLY)
ALT: 26 U/L (ref 0–44)
AST: 34 U/L (ref 15–41)
Albumin: 4 g/dL (ref 3.5–5.0)
Alkaline Phosphatase: 85 U/L (ref 38–126)
Anion gap: 7 (ref 5–15)
BUN: 21 mg/dL (ref 8–23)
CO2: 27 mmol/L (ref 22–32)
Calcium: 9.7 mg/dL (ref 8.9–10.3)
Chloride: 106 mmol/L (ref 98–111)
Creatinine: 0.63 mg/dL (ref 0.44–1.00)
GFR, Estimated: >60 mL/min (ref >60)
Glucose, Bld: 88 mg/dL (ref 70–99)
Potassium: 4.4 mmol/L (ref 3.5–5.1)
Sodium: 140 mmol/L (ref 135–145)
Total Bilirubin: 0.3 mg/dL (ref 0.3–1.2)
Total Protein: 6.4 g/dL — ABNORMAL LOW (ref 6.5–8.1)

## 2021-07-25 LAB — CBC WITH DIFFERENTIAL (CANCER CENTER ONLY)
Abs Immature Granulocytes: 0.02 10*3/uL (ref 0.00–0.07)
Basophils Absolute: 0.1 10*3/uL (ref 0.0–0.1)
Basophils Relative: 1 %
Eosinophils Absolute: 0.3 10*3/uL (ref 0.0–0.5)
Eosinophils Relative: 8 %
HCT: 35.1 % — ABNORMAL LOW (ref 36.0–46.0)
Hemoglobin: 11.3 g/dL — ABNORMAL LOW (ref 12.0–15.0)
Immature Granulocytes: 1 %
Lymphocytes Relative: 22 %
Lymphs Abs: 0.9 10*3/uL (ref 0.7–4.0)
MCH: 31.7 pg (ref 26.0–34.0)
MCHC: 32.2 g/dL (ref 30.0–36.0)
MCV: 98.3 fL (ref 80.0–100.0)
Monocytes Absolute: 0.4 10*3/uL (ref 0.1–1.0)
Monocytes Relative: 10 %
Neutro Abs: 2.3 10*3/uL (ref 1.7–7.7)
Neutrophils Relative %: 58 %
Platelet Count: 254 10*3/uL (ref 150–400)
RBC: 3.57 MIL/uL — ABNORMAL LOW (ref 3.87–5.11)
RDW: 14.5 % (ref 11.5–15.5)
WBC Count: 4 10*3/uL (ref 4.0–10.5)
nRBC: 0 % (ref 0.0–0.2)

## 2021-07-25 LAB — TSH: TSH: <0.08 u[IU]/mL — ABNORMAL LOW (ref 0.308–3.960)

## 2021-07-25 MED ORDER — SODIUM CHLORIDE 0.9 % IV SOLN: Freq: Once | INTRAVENOUS | Status: AC

## 2021-07-25 MED ORDER — SODIUM CHLORIDE 0.9 % IV SOLN
1500.0000 mg | Freq: Once | INTRAVENOUS | Status: AC
  Administered 2021-07-25: 1500 mg via INTRAVENOUS
  Filled 2021-07-25: qty 30

## 2021-07-25 NOTE — Patient Instructions (Signed)
[facility]ICAL [facility]COL[facility]  Discharge Instructions: Thank you for choosing [facility] to provide your oncology and hematology care.   If you have a lab appointment with the Cancer Center, please go directly to the Cancer Center and check in at the registration area.   Wear comfortable clothing and clothing appropriate for easy access to any Portacath or PICC line.   We strive to give you quality time with your provider. You may need to reschedule your appointment if you arrive late (15 or more minutes).  Arriving late affects you and other patients whose appointments are after yours.  Also, if you miss three or more appointments without notifying the office, you may be dismissed from the clinic at the provider's discretion.      For prescription refill requests, have your pharmacy contact our office and allow 72 hours for refills to be completed.    Today you received the following chemotherapy and/or immunotherapy agents Imfinzi      To help prevent nausea and vomiting after your treatment, we encourage you to take your nausea medication as directed.  BELOW ARE SYMPTOMS THAT SHOULD BE REPORTED IMMEDIATELY: *FEVER GREATER THAN 100.4 F (38 C) OR HIGHER *CHILLS OR SWEATING *NAUSEA AND VOMITING THAT IS NOT CONTROLLED WITH YOUR NAUSEA MEDICATION *UNUSUAL SHORTNESS OF BREATH *UNUSUAL BRUISING OR BLEEDING *URINARY PROBLEMS (pain or burning when urinating, or frequent urination) *BOWEL PROBLEMS (unusual diarrhea, constipation, pain near the anus) TENDERNESS IN MOUTH AND THROAT WITH OR WITHOUT PRESENCE OF ULCERS (sore throat, sores in mouth, or a toothache) UNUSUAL RASH, SWELLING OR PAIN  UNUSUAL VAGINAL DISCHARGE OR ITCHING   Items with * indicate a potential emergency and should be followed up as soon as possible or go to the Emergency Department if any problems should occur.  Please show the CHEMOTHERAPY ALERT CARD or IMMUNOTHERAPY ALERT CARD at check-in to the  Emergency Department and triage nurse.  Should you have questions after your visit or need to cancel or reschedule your appointment, please contact [facility]  Dept: [phone]  and follow the prompts.  Office hours are 8:00 a.m. to 4:30 p.m. Monday - Friday. Please note that voicemails left after 4:00 p.m. may not be returned until the following business day.  We are closed weekends and major holidays. You have access to a nurse at all times for urgent questions. Please call the main number to the clinic Dept: [phone] and follow the prompts.   For any non-urgent questions, you may also contact your provider using MyChart. We now offer e-Visits for anyone 18 and older to request care online for non-urgent symptoms. For details visit [email or web address].   Also download the MyChart app! Go to the app store, search "MyChart", open the app, select [facility], and log in with your MyChart username and password.  Due to Covid, a mask is required upon entering the hospital/clinic. If you do not have a mask, one will be given to you upon arrival. For doctor visits, patients may have 1 support person aged 18 or older with them. For treatment visits, patients cannot have anyone with them due to current Covid guidelines and our immunocompromised population.   

## 2021-07-25 NOTE — Progress Notes (Signed)
Pt presented for imfinzi infusion today, during end of infusion pt IV infiltrated. Pt declined to let RN know, pt states that it "felt fine" and reported 0/10 pain. IV was in L. Wrist, attempted to aspirate drug, was not successful. IV removed intact. MD made aware, as well as Tim, RN and Chrissie Noa, Therapist, sports. No need for pt to return in the following days unless pt reports any problems, per MD and per protocol. Pt educated to call with any questions or concerns, to ice area 3-4 times a day, to elevate arm, and to watch for increased swelling, redness, or pain. Pt verbalized understanding and declined paperwork. VSS and pt discharged in stable condition without any further questions or concerns.  ?

## 2021-07-25 NOTE — Progress Notes (Signed)
Patient had issues with her IV- it appears that some of her imfinzi infiltrated into her her left hand and a couple inches above into her wrist. Dr. Julien Nordmann was called and he came and assessed it. Protocols were followed and imfinzi was determined to not be a vesicant or even an irritant per the literature. Tim and Chrissie Noa, also came down observed it and helped educate the patient. Patient was instructed to keep out of the light and ice it/elevate it QID. Patient expressed understanding. Patient was also told to call the infusion center/MD for further instructions if she felt like she had any further symptoms. ?

## 2021-07-27 ENCOUNTER — Ambulatory Visit
Admission: RE | Admit: 2021-07-27 | Discharge: 2021-07-27 | Disposition: A | Discharge status: Home / Self Care | Payer: Medicare HMO | Source: Ambulatory Visit | Attending: Internal Medicine | Admitting: Internal Medicine

## 2021-07-27 DIAGNOSIS — G9389 Other specified disorders of brain: Secondary | ICD-10-CM | POA: Diagnosis not present

## 2021-07-27 DIAGNOSIS — C7931 Secondary malignant neoplasm of brain: Secondary | ICD-10-CM

## 2021-07-27 MED ORDER — GADOBENATE DIMEGLUMINE 529 MG/ML IV SOLN
11.0000 mL | Freq: Once | INTRAVENOUS | Status: AC | PRN
  Administered 2021-07-27: 11 mL via INTRAVENOUS

## 2021-07-30 ENCOUNTER — Inpatient Hospital Stay: Payer: Medicare HMO

## 2021-07-31 ENCOUNTER — Telehealth: Payer: Self-pay | Admitting: Radiation Oncology

## 2021-07-31 NOTE — Telephone Encounter (Signed)
I called the patient and her husband to let them know that we were aware of the MRI findings. She will meet with Dr. Mickeal Skinner later this week, but I wanted them to know that we were actively working together to come up with recommendations. Her MRI unfortunately shows 40 metastatic lesions in the brain. Cognitively she has suffered prior to her diagnosis of brain disease, and since her chemotherapy was able to improve her prior brain disease we had held off on radiation. The recommendation now would be to proceed with whole brain radiation, and the patient and her husband would like to more closely discuss what those risks could be in addition to her current cognitive function. He is interested in holding a spot for simulation next week if she were to proceed. ?

## 2021-08-01 ENCOUNTER — Ambulatory Visit: Payer: Medicare HMO | Admitting: Internal Medicine

## 2021-08-01 ENCOUNTER — Other Ambulatory Visit: Payer: Medicare HMO

## 2021-08-01 ENCOUNTER — Ambulatory Visit: Payer: Medicare HMO

## 2021-08-02 ENCOUNTER — Other Ambulatory Visit: Payer: Self-pay

## 2021-08-02 ENCOUNTER — Ambulatory Visit: Payer: Medicare HMO

## 2021-08-02 ENCOUNTER — Inpatient Hospital Stay: Payer: Medicare HMO | Admitting: Internal Medicine

## 2021-08-02 VITALS — BP 121/86 | HR 102 | Temp 97.5°F | Resp 18 | Wt 135.7 lb

## 2021-08-02 DIAGNOSIS — C7931 Secondary malignant neoplasm of brain: Secondary | ICD-10-CM

## 2021-08-02 DIAGNOSIS — C3431 Malignant neoplasm of lower lobe, right bronchus or lung: Secondary | ICD-10-CM | POA: Diagnosis not present

## 2021-08-02 DIAGNOSIS — Z5111 Encounter for antineoplastic chemotherapy: Secondary | ICD-10-CM | POA: Diagnosis not present

## 2021-08-02 DIAGNOSIS — Z79899 Other long term (current) drug therapy: Secondary | ICD-10-CM | POA: Diagnosis not present

## 2021-08-02 NOTE — Progress Notes (Signed)
? ?[facility] [address]  ?[place], [place] [address] ?[phone] ? ? ?Interval Evaluation ? ?Date of Service: 08/02/21 ?Patient Name: Christy Kelley ?Patient MRN: [id number] ?Patient DOB: June 24, 1950 ?Provider: Ventura Sellers, MD ? ?Identifying Statement:  ?Christy Kelley is a 71 y.o. female with Malignant neoplasm metastatic to brain [facility])  ? ?Primary Cancer: ? ?Oncologic History: ?Oncology History  ?Small cell carcinoma of right lung ([place])  ?01/24/2021 Initial Diagnosis  ? Small cell carcinoma of right lung [facility]) ?  ?01/24/2021 Cancer Staging  ? Staging form: Lung, AJCC 8th Edition ?- Clinical: Stage IVA (cT2b, cN2, cM1b) - Signed by Curt Bears, MD on 02/07/2021 ? ?  ?01/30/2021 - 02/01/2021 Chemotherapy  ? Patient is on Treatment Plan : LUNG SMALL CELL Cisplatin D1 + Etoposide D1-3 q21d  ?   ?02/27/2021 -  Chemotherapy  ? Patient is on Treatment Plan : LUNG SMALL CELL EXTENSIVE STAGE Durvalumab + Carboplatin D1 + Etoposide D1-3 q21d x 4 Cycles / Durvalumab q28d  ?   ?SCLC (small cell lung carcinoma), right ([place])  ?02/07/2021 Initial Diagnosis  ? SCLC (small cell lung carcinoma), right ([place]) ?  ?02/27/2021 -  Chemotherapy  ? Patient is on Treatment Plan : LUNG SMALL CELL EXTENSIVE STAGE Durvalumab + Carboplatin D1 + Etoposide D1-3 q21d x 4 Cycles / Durvalumab q28d  ?   ? ? ?Interval History: ?Christy Kelley presents today for follow up after recent MRI brain.  She continues to describe short term memory impairment, cognitive decline as discussed prior.  Not appreciably worse today compared to 2 months ago.  She is currently on Ifimzi consolidation with Dr. Julien Nordmann.  Denies seizures, headaches. ? ?H+P (05/29/21) Patient and her husband present today to discuss her cognitive impairment.  Husband describes ~10 month history of impaired short term memory, "slowdown" of processing, difficulty finding words, impaired insight.  This started in April following her COVID pneumonia  diagnosis and treatment, and continued as small cell lung cancer was identified and treated starting several months later.  At this time, she is unable to drive or shop for household goods because of these limitations.  She continues to dress and feed herself, and maintain basic needs without aid.  Husband claims that one year ago she was "completely normal" and "sharp".  Currently dosing chemotherapy and immunotherapy with Dr. Julien Nordmann. ? ?Medications: ?Current Outpatient Medications on File Prior to Visit  ?Medication Sig Dispense Refill  ? Ascorbic Acid (VITAMIN C PO) Take 1,000 mg by mouth daily.    ? benzonatate (TESSALON) 100 MG capsule Take 1 capsule (100 mg total) by mouth 3 (three) times daily as needed for cough. (Patient not taking: Reported on 05/29/2021) 30 capsule 0  ? calcium carbonate (OSCAL) 1500 (600 Ca) MG TABS tablet Take by mouth daily with breakfast.    ? LORazepam (ATIVAN) 0.5 MG tablet 1 tab po 30 minutes prior to radiation or MRI scans 30 tablet 0  ? meloxicam (MOBIC) 15 MG tablet Take 1 tablet (15 mg total) by mouth daily. 30 tablet 2  ? memantine (NAMENDA) 10 MG tablet Take 1 tablet (10 mg total) by mouth 2 (two) times daily. (Patient not taking: Reported on 05/29/2021) 60 tablet 4  ? memantine (NAMENDA) 5 MG tablet Begin this prescription the first day of brain radiation. Week 1: take one tablet po qam. Week 2: take one tablet qam and qpm. Week 3: take two tablets qam, and one tablet po q pm. Week 4:  take two tablets qam and qpm. Fill subsequent prescription q month. (Patient not taking: Reported on 05/29/2021) 70 tablet 0  ? Multiple Vitamin (MULTIVITAMIN) tablet Take 1 tablet by mouth daily.    ? OVER THE COUNTER MEDICATION Balance of Nature supplement    ? temazepam (RESTORIL) 15 MG capsule Take 1 capsule (15 mg total) by mouth at bedtime as needed for sleep. 30 capsule 0  ? Vitamin D, Cholecalciferol, 25 MCG (1000 UT) TABS Take 1,000 Units by mouth daily.    ? ?No current  facility-administered medications on file prior to visit.  ? ? ?Allergies: No Known Allergies ?Past Medical History:  ?Past Medical History:  ?Diagnosis Date  ? Arthritis   ? Lung cancer ([place])   ? Dx 9/27 with lung cancer  ? ?Past Surgical History:  ?Past Surgical History:  ?Procedure Laterality Date  ? BRONCHIAL WASHINGS  01/18/2021  ? Procedure: BRONCHIAL WASHINGS;  Surgeon: Julian Hy, DO;  [facility];  Service: Endoscopy;;  ? ENDOBRONCHIAL ULTRASOUND N/A 01/18/2021  ? Procedure: ENDOBRONCHIAL ULTRASOUND;  Surgeon: Julian Hy, DO;  [facility];  Service: Endoscopy;  Laterality: N/A;  ? FINE NEEDLE ASPIRATION  01/18/2021  ? Procedure: FINE NEEDLE ASPIRATION (FNA) LINEAR;  Surgeon: Julian Hy, DO;  [facility];  Service: Endoscopy;;  ? VIDEO BRONCHOSCOPY N/A 01/18/2021  ? Procedure: VIDEO BRONCHOSCOPY WITHOUT FLUORO;  Surgeon: Julian Hy, DO;  [facility];  Service: Endoscopy;  Laterality: N/A;  ? ?Social History:  ?Social History  ? ?Socioeconomic History  ? Marital status: Married  ?  Spouse name: Not on file  ? Number of children: Not on file  ? Years of education: Not on file  ? Highest education level: Not on file  ?Occupational History  ? Not on file  ?Tobacco Use  ? Smoking status: Former  ?  Packs/day: 0.25  ?  Years: 35.00  ?  Pack years: 8.75  ?  Types: Cigarettes  ?  Quit date: 08/29/2020  ?  Years since quitting: 0.9  ? Smokeless tobacco: Never  ?Vaping Use  ? Vaping Use: Never used  ?Substance and Sexual Activity  ? Alcohol use: Yes  ?  Comment: Beer 6 daily. Recenlty went to 2-3 dialy  ? Drug use: Never  ? Sexual activity: Not on file  ?Other Topics Concern  ? Not on file  ?Social History Narrative  ? Not on file  ? ?Social Determinants of Health  ? ?Financial Resource Strain: Not on file  ?Food Insecurity: Not on file  ?Transportation Needs: Not on file  ?Physical Activity: Not on file  ?Stress: Not on file  ?Social Connections: Not on file   ?Intimate Partner Violence: Not on file  ? ?Family History:  ?Family History  ?Problem Relation Age of Onset  ? Arthritis/Rheumatoid Mother   ? Diabetes Father   ? Hodgkin's lymphoma Father   ? Colon cancer Neg Hx   ? Colon polyps Neg Hx   ? Esophageal cancer Neg Hx   ? Rectal cancer Neg Hx   ? Stomach cancer Neg Hx   ? ? ?Review of Systems: ?Constitutional: Doesn't report fevers, chills or abnormal weight loss ?Eyes: Doesn't report blurriness of vision ?Ears, nose, mouth, throat, and face: Doesn't report sore throat ?Respiratory: Doesn't report cough, dyspnea or wheezes ?Cardiovascular: Doesn't report palpitation, chest discomfort  ?Gastrointestinal:  Doesn't report nausea, constipation, diarrhea ?GU: Doesn't report incontinence ?Skin: Doesn't report skin rashes ?Neurological: Per HPI ?Musculoskeletal: Doesn't  report joint pain ?Behavioral/Psych: Doesn't report anxiety ? ?Physical Exam: ?Vitals:  ? 08/02/21 1020  ?BP: 121/86  ?Pulse: (!) 102  ?Resp: 18  ?Temp: (!) 97.5 ?F (36.4 ?C)  ?SpO2: 98%  ? ? ?KPS: 70. ?General: Alert, cooperative, pleasant, in no acute distress ?Head: Normal ?EENT: No conjunctival injection or scleral icterus.  ?Lungs: Resp effort normal ?Cardiac: Regular rate ?Abdomen: Non-distended abdomen ?Skin: No rashes cyanosis or petechiae. ?Extremities: No clubbing or edema ? ?Neurologic Exam: ?Mental Status: Awake, alert, attentive to examiner. Oriented to self and environment. Language is fluent with intact comprehension.  Age advanced psychomotor slowing, impaired recall.  Limited insight into disease, condition. ?Cranial Nerves: Visual acuity is grossly normal. Visual fields are full. Extra-ocular movements intact. No ptosis. Face is symmetric ?Motor: Tone and bulk are normal. Power is full in both arms and legs. Reflexes are symmetric, no pathologic reflexes present.  ?Sensory: Intact to light touch ?Gait: Normal. ? ? ?Labs: ?I have reviewed the data as listed ?   ?Component Value Date/Time  ?  NA 140 07/25/2021 0844  ? K 4.4 07/25/2021 0844  ? CL 106 07/25/2021 0844  ? CO2 27 07/25/2021 0844  ? GLUCOSE 88 07/25/2021 0844  ? BUN 21 07/25/2021 0844  ? CREATININE 0.63 07/25/2021 0844  ? CALCIUM 9.7 07/25/2021 0844

## 2021-08-03 ENCOUNTER — Ambulatory Visit: Payer: Medicare HMO

## 2021-08-06 ENCOUNTER — Encounter: Payer: Self-pay | Admitting: Radiation Oncology

## 2021-08-06 ENCOUNTER — Encounter: Payer: Self-pay | Admitting: Internal Medicine

## 2021-08-06 NOTE — Progress Notes (Signed)
Pt's husband called to let us know that Ms. Eickholt will not proceed with radiation. Treatment planning was cancelled and neuro oncology informed as well.  ?

## 2021-08-07 ENCOUNTER — Ambulatory Visit: Payer: Medicare HMO | Admitting: Radiation Oncology

## 2021-08-08 ENCOUNTER — Other Ambulatory Visit: Payer: Medicare HMO

## 2021-08-09 ENCOUNTER — Encounter: Payer: Self-pay | Admitting: Internal Medicine

## 2021-08-15 ENCOUNTER — Ambulatory Visit: Payer: Medicare HMO | Admitting: Radiation Oncology

## 2021-08-16 ENCOUNTER — Ambulatory Visit: Payer: Medicare HMO

## 2021-08-17 ENCOUNTER — Ambulatory Visit: Payer: Medicare HMO

## 2021-08-20 ENCOUNTER — Ambulatory Visit: Payer: Medicare HMO

## 2021-08-21 ENCOUNTER — Ambulatory Visit: Payer: Medicare HMO

## 2021-08-22 ENCOUNTER — Inpatient Hospital Stay (HOSPITAL_BASED_OUTPATIENT_CLINIC_OR_DEPARTMENT_OTHER): Payer: Medicare HMO | Admitting: Internal Medicine

## 2021-08-22 ENCOUNTER — Encounter: Payer: Self-pay | Admitting: Internal Medicine

## 2021-08-22 ENCOUNTER — Inpatient Hospital Stay: Payer: Medicare HMO

## 2021-08-22 ENCOUNTER — Telehealth: Payer: Self-pay | Admitting: Nurse Practitioner

## 2021-08-22 ENCOUNTER — Other Ambulatory Visit: Payer: Self-pay

## 2021-08-22 ENCOUNTER — Inpatient Hospital Stay: Payer: Medicare HMO | Attending: Internal Medicine

## 2021-08-22 ENCOUNTER — Ambulatory Visit: Payer: Medicare HMO

## 2021-08-22 ENCOUNTER — Encounter: Payer: Self-pay | Admitting: *Deleted

## 2021-08-22 VITALS — BP 122/87 | HR 99 | Temp 97.1°F | Resp 18 | Wt 140.4 lb

## 2021-08-22 DIAGNOSIS — C3491 Malignant neoplasm of unspecified part of right bronchus or lung: Secondary | ICD-10-CM

## 2021-08-22 DIAGNOSIS — M25562 Pain in left knee: Secondary | ICD-10-CM

## 2021-08-22 DIAGNOSIS — C7931 Secondary malignant neoplasm of brain: Secondary | ICD-10-CM | POA: Insufficient documentation

## 2021-08-22 DIAGNOSIS — C3431 Malignant neoplasm of lower lobe, right bronchus or lung: Secondary | ICD-10-CM | POA: Diagnosis not present

## 2021-08-22 DIAGNOSIS — R197 Diarrhea, unspecified: Secondary | ICD-10-CM | POA: Diagnosis not present

## 2021-08-22 DIAGNOSIS — Z5112 Encounter for antineoplastic immunotherapy: Secondary | ICD-10-CM

## 2021-08-22 DIAGNOSIS — G8929 Other chronic pain: Secondary | ICD-10-CM | POA: Diagnosis not present

## 2021-08-22 DIAGNOSIS — Z79899 Other long term (current) drug therapy: Secondary | ICD-10-CM | POA: Insufficient documentation

## 2021-08-22 DIAGNOSIS — M2550 Pain in unspecified joint: Secondary | ICD-10-CM | POA: Insufficient documentation

## 2021-08-22 LAB — CBC WITH DIFFERENTIAL (CANCER CENTER ONLY)
Abs Immature Granulocytes: 0.01 10*3/uL (ref 0.00–0.07)
Basophils Absolute: 0 10*3/uL (ref 0.0–0.1)
Basophils Relative: 1 %
Eosinophils Absolute: 0.1 10*3/uL (ref 0.0–0.5)
Eosinophils Relative: 2 %
HCT: 32.5 % — ABNORMAL LOW (ref 36.0–46.0)
Hemoglobin: 10.7 g/dL — ABNORMAL LOW (ref 12.0–15.0)
Immature Granulocytes: 0 %
Lymphocytes Relative: 23 %
Lymphs Abs: 0.7 10*3/uL (ref 0.7–4.0)
MCH: 31.5 pg (ref 26.0–34.0)
MCHC: 32.9 g/dL (ref 30.0–36.0)
MCV: 95.6 fL (ref 80.0–100.0)
Monocytes Absolute: 0.5 10*3/uL (ref 0.1–1.0)
Monocytes Relative: 15 %
Neutro Abs: 1.8 10*3/uL (ref 1.7–7.7)
Neutrophils Relative %: 59 %
Platelet Count: 289 10*3/uL (ref 150–400)
RBC: 3.4 MIL/uL — ABNORMAL LOW (ref 3.87–5.11)
RDW: 13.8 % (ref 11.5–15.5)
WBC Count: 3 10*3/uL — ABNORMAL LOW (ref 4.0–10.5)
nRBC: 0 % (ref 0.0–0.2)

## 2021-08-22 LAB — CMP (CANCER CENTER ONLY)
ALT: 19 U/L (ref 0–44)
AST: 32 U/L (ref 15–41)
Albumin: 3.7 g/dL (ref 3.5–5.0)
Alkaline Phosphatase: 67 U/L (ref 38–126)
Anion gap: 7 (ref 5–15)
BUN: 27 mg/dL — ABNORMAL HIGH (ref 8–23)
CO2: 26 mmol/L (ref 22–32)
Calcium: 9.3 mg/dL (ref 8.9–10.3)
Chloride: 107 mmol/L (ref 98–111)
Creatinine: 0.59 mg/dL (ref 0.44–1.00)
GFR, Estimated: >60 mL/min (ref >60)
Glucose, Bld: 97 mg/dL (ref 70–99)
Potassium: 3.8 mmol/L (ref 3.5–5.1)
Sodium: 140 mmol/L (ref 135–145)
Total Bilirubin: 0.6 mg/dL (ref 0.3–1.2)
Total Protein: 6 g/dL — ABNORMAL LOW (ref 6.5–8.1)

## 2021-08-22 LAB — TSH: TSH: 1.445 u[IU]/mL (ref 0.350–4.500)

## 2021-08-22 MED ORDER — METHYLPREDNISOLONE 4 MG PO TBPK: ORAL_TABLET | ORAL | 0 refills | Status: DC

## 2021-08-22 MED ORDER — MELOXICAM 15 MG PO TABS: 15.0000 mg | ORAL_TABLET | Freq: Every day | ORAL | 0 refills | Status: AC

## 2021-08-22 NOTE — Telephone Encounter (Signed)
Tammy from [facility] called to ask if Karl Ito would approve to be the pt's hospice provider. She also stated that she recently sent him a secure message about this same thing though Epic, wanted to know if he received the message.  ? ?Callback Number: [phone] ?

## 2021-08-22 NOTE — Telephone Encounter (Signed)
I responded via secure chat and stated I would be her attending for hospice ?

## 2021-08-22 NOTE — Progress Notes (Signed)
Oncology Nurse Navigator Documentation ? ? ?  08/22/2021  ? 10:00 AM 01/30/2021  ?  9:00 AM  ?Oncology Nurse Navigator Flowsheets  ?Abnormal Finding Date  07/27/2020  ?Confirmed Diagnosis Date  01/16/2021  ?Diagnosis Status  Confirmed Diagnosis Complete  ?Planned Course of Treatment  Chemo/Radiation Concurrent  ?Phase of Treatment  Radiation  ?Chemotherapy Actual Start Date:  01/30/2021  ?Radiation Actual Start Date:  02/05/2021  ?Navigator Follow Up Date:  02/06/2021  ?Navigator Follow Up Reason:  Appointment Review  ?Navigation Complete Date: 08/22/2021   ?Post Navigation: Continue to Follow Patient? No   ?Reason Not Navigating Patient: Hospice/Death   ?Navigator Location [facility]  ?Navigator Encounter Type Clinic/MDC Treatment  ?Treatment Initiated Date  01/30/2021  ?Patient Visit Type MedOnc/patient and husband were seen by Dr. Julien Nordmann today.  They have decided patient to be referred to Hospice.   MedOnc  ?Treatment Phase Other First Chemo Tx  ?Barriers/Navigation Needs  Education  ?Interventions Psycho-Social Support Psycho-Social Support;Education  ?Acuity Level 2-Minimal Needs (1-2 Barriers Identified) Level 2-Minimal Needs (1-2 Barriers Identified)  ?Education Method  Verbal;Written  ?Time Spent with Patient 30 15  ?  ?

## 2021-08-22 NOTE — Progress Notes (Signed)
?    [facility] ?[phone]   [phone] ? ?OFFICE PROGRESS NOTE ? ?Michela Pitcher, NP ?[place] ?[place] [address] ? ?DIAGNOSIS: Extensive stage (T2 a, N2, M1b) small cell lung cancer diagnosed in September 2022 and presented with bulky right hilar mass with mediastinal lymphadenopathy and occlusion of the distal right main bronchus and right lower and middle lobe bronchi by the mass lesion with few subcentimeter brain metastasis. ? ?PRIOR THERAPY: Systemic chemotherapy with cisplatin 80 Mg/M2 on day 1 and etoposide 100 Mg/M2 on days 1, 2 and 3 status post 1 cycle.  This treatment was discontinued after the patient was found to have brain metastasis. ? ?CURRENT THERAPY: Systemic chemotherapy with carboplatin for AUC of 5 on day 1, etoposide 100 Mg/M2 on days 1, 2 and 3 with Cosela before chemotherapy as well as Imfinzi 1500 Mg IV every 3 weeks.  First dose February 20, 2021.  Status post 6 cycles.  Starting from cycle #5 the patient will be on maintenance treatment with Imfinzi 1500 Mg IV every 4 weeks. ? ?INTERVAL HISTORY: ?Christy Kelley 71 y.o. female returns to the clinic today for follow-up visit accompanied by her husband.  The patient is feeling fine today with no concerning complaints except for fatigue and some mental status change and dementia.  She was found recently on MRI of the brain to have multiple new brain lesion.  It was recommended for the patient to consider whole brain irradiation but they declined this option.  She denied having any current chest pain, shortness of breath, cough or hemoptysis.  She denied having any fever or chills.  She has no nausea, vomiting, but continues to have 3-4 episodes of diarrhea with no constipation.  She has no headache or visual changes.  She was supposed to start cycle #7 today but the patient and her husband are here today for discussion of her options based on the new findings from the brain MRI. ? ?MEDICAL  HISTORY: ?Past Medical History:  ?Diagnosis Date  ? Arthritis   ? Lung cancer ([place])   ? Dx 9/27 with lung cancer  ? ? ?ALLERGIES:  has No Known Allergies. ? ?MEDICATIONS:  ?Current Outpatient Medications  ?Medication Sig Dispense Refill  ? Ascorbic Acid (VITAMIN C PO) Take 1,000 mg by mouth daily.    ? calcium carbonate (OSCAL) 1500 (600 Ca) MG TABS tablet Take by mouth daily with breakfast.    ? LORazepam (ATIVAN) 0.5 MG tablet 1 tab po 30 minutes prior to radiation or MRI scans (Patient not taking: Reported on 08/02/2021) 30 tablet 0  ? meloxicam (MOBIC) 15 MG tablet Take 1 tablet (15 mg total) by mouth daily. 30 tablet 2  ? memantine (NAMENDA) 10 MG tablet Take 1 tablet (10 mg total) by mouth 2 (two) times daily. (Patient not taking: Reported on 05/29/2021) 60 tablet 4  ? memantine (NAMENDA) 5 MG tablet Begin this prescription the first day of brain radiation. Week 1: take one tablet po qam. Week 2: take one tablet qam and qpm. Week 3: take two tablets qam, and one tablet po q pm. Week 4: take two tablets qam and qpm. Fill subsequent prescription q month. (Patient not taking: Reported on 05/29/2021) 70 tablet 0  ? Multiple Vitamin (MULTIVITAMIN) tablet Take 1 tablet by mouth daily.    ? OVER THE COUNTER MEDICATION Balance of Nature supplement    ? temazepam (RESTORIL) 15 MG capsule Take 1 capsule (15 mg  total) by mouth at bedtime as needed for sleep. 30 capsule 0  ? Vitamin D, Cholecalciferol, 25 MCG (1000 UT) TABS Take 1,000 Units by mouth daily.    ? ?No current facility-administered medications for this visit.  ? ? ?SURGICAL HISTORY:  ?Past Surgical History:  ?Procedure Laterality Date  ? BRONCHIAL WASHINGS  01/18/2021  ? Procedure: BRONCHIAL WASHINGS;  Surgeon: Julian Hy, DO;  Location: [facility];  Service: Endoscopy;;  ? ENDOBRONCHIAL ULTRASOUND N/A 01/18/2021  ? Procedure: ENDOBRONCHIAL ULTRASOUND;  Surgeon: Julian Hy, DO;  Location: [facility];  Service: Endoscopy;  Laterality: N/A;  ? FINE NEEDLE  ASPIRATION  01/18/2021  ? Procedure: FINE NEEDLE ASPIRATION (FNA) LINEAR;  Surgeon: Julian Hy, DO;  Location: [facility];  Service: Endoscopy;;  ? VIDEO BRONCHOSCOPY N/A 01/18/2021  ? Procedure: VIDEO BRONCHOSCOPY WITHOUT FLUORO;  Surgeon: Julian Hy, DO;  Location: [facility];  Service: Endoscopy;  Laterality: N/A;  ? ? ?REVIEW OF SYSTEMS:  Constitutional: positive for fatigue ?Eyes: negative ?Ears, nose, mouth, throat, and face: negative ?Respiratory: negative ?Cardiovascular: negative ?Gastrointestinal: positive for diarrhea ?Genitourinary:negative ?Integument/breast: negative ?Hematologic/lymphatic: negative ?Musculoskeletal:negative ?Neurological: positive for memory problems ?Behavioral/Psych: negative ?Endocrine: negative ?Allergic/Immunologic: negative  ? ?PHYSICAL EXAMINATION: General appearance: alert, cooperative, fatigued, and no distress ?Head: Normocephalic, without obvious abnormality, atraumatic ?Neck: no adenopathy, no JVD, supple, symmetrical, trachea midline, and thyroid not enlarged, symmetric, no tenderness/mass/nodules ?Lymph nodes: Cervical, supraclavicular, and axillary nodes normal. ?Resp: clear to auscultation bilaterally ?Back: symmetric, no curvature. ROM normal. No CVA tenderness. ?Cardio: regular rate and rhythm, S1, S2 normal, no murmur, click, rub or gallop ?GI: soft, non-tender; bowel sounds normal; no masses,  no organomegaly ?Extremities: extremities normal, atraumatic, no cyanosis or edema ?Neurologic: Alert and oriented X 3, normal strength and tone. Normal symmetric reflexes. Normal coordination and gait ? ?ECOG PERFORMANCE STATUS: 1 - Symptomatic but completely ambulatory ? ?Blood pressure 122/87, pulse 99, temperature (!) 97.1 ?F (36.2 ?C), temperature source Tympanic, resp. rate 18, weight 140 lb 7 oz (63.7 kg), SpO2 98 %. ? ?LABORATORY DATA: ?Lab Results  ?Component Value Date  ? WBC 3.0 (L) 08/22/2021  ? HGB 10.7 (L) 08/22/2021  ? HCT 32.5 (L) 08/22/2021  ? MCV  95.6 08/22/2021  ? PLT 289 08/22/2021  ? ? ?  Chemistry   ?   ?Component Value Date/Time  ? NA 140 07/25/2021 0844  ? K 4.4 07/25/2021 0844  ? CL 106 07/25/2021 0844  ? CO2 27 07/25/2021 0844  ? BUN 21 07/25/2021 0844  ? CREATININE 0.63 07/25/2021 0844  ?    ?Component Value Date/Time  ? CALCIUM 9.7 07/25/2021 0844  ? ALKPHOS 85 07/25/2021 0844  ? AST 34 07/25/2021 0844  ? ALT 26 07/25/2021 0844  ? BILITOT 0.3 07/25/2021 0844  ?  ? ? ? ?RADIOGRAPHIC STUDIES: ?MR BRAIN W WO CONTRAST ? ?Result Date: 07/27/2021 ?CLINICAL DATA:  Small-cell lung cancer EXAM: MRI HEAD WITHOUT AND WITH CONTRAST TECHNIQUE: Multiplanar, multiecho pulse sequences of the brain and surrounding structures were obtained without and with intravenous contrast. CONTRAST:  65mL MULTIHANCE GADOBENATE DIMEGLUMINE 529 MG/ML IV SOLN COMPARISON:  Brain MRI 05/27/2021 FINDINGS: Brain: Numerous enhancing metastatic lesions are identified in the supratentorial and infratentorial brain and brainstem, with at least 40 lesions identified, most conspicuous on the T1 postcontrast black blood sequence. The largest lesion measures up to 1.0 cm in the left parietal lobe. The largest lesion on the right measures up to approximately 0.7 cm. The largest infratentorial lesion measures  up to approximately 0.8 cm in the right cerebellar hemisphere. Overall, these lesions are increased in number and conspicuity compared to the study from 04/13/2021. Absence of enhancing lesions on the interval study from 05/27/2021 is likely at least in part due to motion artifact. There is mild edema surrounding the metastatic lesions with no significant mass effect or midline shift. No intralesional hemorrhage is identified. There is no evidence of leptomeningeal spread of disease. Background parenchymal volume is stable. The ventricles are stable in size. There is no midline shift. Vascular: Normal flow voids. Skull and upper cervical spine: There are a few intrinsically T1 hyperintense  lesions in the calvarium favored to reflect benign vascular lesions. There is no suspicious marrow signal abnormality. Sinuses/Orbits: The paranasal sinuses are clear. Globes and orbits are unremarkable

## 2021-08-23 ENCOUNTER — Ambulatory Visit: Payer: Medicare HMO

## 2021-08-24 ENCOUNTER — Ambulatory Visit: Payer: Medicare HMO

## 2021-08-27 ENCOUNTER — Ambulatory Visit: Payer: Medicare HMO

## 2021-08-28 ENCOUNTER — Ambulatory Visit: Payer: Medicare HMO

## 2021-08-28 ENCOUNTER — Telehealth: Payer: Self-pay | Admitting: Nurse Practitioner

## 2021-08-28 NOTE — Telephone Encounter (Signed)
Called Katharine Look back but not able to leave a voicemail, per note below she is in the meeting until 3 pm. Will call after that again ?

## 2021-08-28 NOTE — Telephone Encounter (Signed)
Katharine Look ?[place] ?[phone] ? ?Katharine Look called re: severe diarrhea  that this patient is having, 5-6 times daily only when awake not at night ? ?Patient is currently finishing a dosing of  ?methylPREDNISolone (MEDROL DOSEPAK) 4 MG TBPK tablet ? ?But it ends tomorrow ? ?Katharine Look says usually a standing order for lopermide is what they give, with 4 mg first dose and 2 mg after for each unformed stool, no more than 16mg  per day ? ?If whatever is prescribed could be called into  ? ?[id number] [place], [place] - [address] AT [place] Phone:  [phone]  ?Fax:  [phone]  ?  ? ?Katharine Look is in a meeting from 12-3 and will not be able to get the medicine to the patient at that time ?

## 2021-08-28 NOTE — Telephone Encounter (Signed)
I spoke with Katharine Look, they already received the fax with standard order for Loperamide and Katharine Look thinks patient's husband already picked it up. Stools have been the same the last 5 months its just became more frequent recently since change in the dose. Hospice provider was also worried about Meloxicam and Prednisone been taking together, which prednisone will be done tomorrow. Katharine Look will have a standard order placed for the patient for the stool studies if in 2 days this is not better. Overall patient is doing well as far as eating ok and drinking and acting well.  ?Will updated [place] in 2 days if proceeding with stool study. ?

## 2021-08-28 NOTE — Telephone Encounter (Signed)
According to the oncologist if it did not improve we need to get a sample and di c. Diff along with ova and parasites. Can we get that prior to starting the immodium? ? ? ?

## 2021-08-28 NOTE — Telephone Encounter (Signed)
Called Christy Kelley back but not able to leave a voicemail,x 2, voicemail is not set up ?

## 2021-08-29 NOTE — Telephone Encounter (Signed)
noted 

## 2021-09-19 ENCOUNTER — Ambulatory Visit: Payer: Medicare HMO

## 2021-09-19 ENCOUNTER — Other Ambulatory Visit: Payer: Medicare HMO

## 2021-09-19 ENCOUNTER — Ambulatory Visit: Payer: Medicare HMO | Admitting: Physician Assistant

## 2021-09-24 ENCOUNTER — Ambulatory Visit (INDEPENDENT_AMBULATORY_CARE_PROVIDER_SITE_OTHER): Payer: Medicare HMO | Admitting: Nurse Practitioner

## 2021-09-24 ENCOUNTER — Telehealth: Payer: Self-pay | Admitting: Nurse Practitioner

## 2021-09-24 VITALS — BP 104/80 | HR 114 | Temp 97.7°F | Resp 14 | Wt 138.0 lb

## 2021-09-24 DIAGNOSIS — B029 Zoster without complications: Secondary | ICD-10-CM

## 2021-09-24 MED ORDER — GABAPENTIN 100 MG PO CAPS: ORAL_CAPSULE | ORAL | 0 refills | Status: AC

## 2021-09-24 NOTE — Telephone Encounter (Signed)
Katharine Look with [facility] called and said pt might have shingles on eyelid, scheduled appt this afternoon

## 2021-09-24 NOTE — Progress Notes (Signed)
Acute Office Visit  Subjective:     Patient ID: Christy Kelley, female    DOB: Dec 28, 1950, 71 y.o.   MRN: [id number]  Chief Complaint  Patient presents with   Rash    Started on 64/23-/Around left eye and left forehead. [facility].     Rash  Patient is in today for rash  States that it was noticed on Sunday 09/24/2021.  States that the home health nurse/hospice nurse came out after he called and evaluated the rash. They contacted the NP on call and consulted.  They did write valacyclovir until she can be evaluated in person States that was seen by hospice nurse and today and they felt it was shingles States that seen today by ophthalmology today and there was no corneal involvement. Is moderate swelling to the left inferior periorbital area  Review of Systems  HENT:  Negative for ear discharge, ear pain and tinnitus.   Skin:  Positive for rash.       "+" Edema  Neurological:  Negative for tingling.       Objective:    BP 104/80   Pulse (!) 114   Temp 97.7 F (36.5 C)   Resp 14   Wt 138 lb (62.6 kg)   LMP  (LMP Unknown)   SpO2 95%   BMI 24.45 kg/m    Physical Exam Vitals and nursing note reviewed.  Constitutional:      Appearance: Normal appearance.  HENT:     Right Ear: Tympanic membrane, ear canal and external ear normal.     Left Ear: Tympanic membrane, ear canal and external ear normal.     Mouth/Throat:     Mouth: Mucous membranes are moist.     Pharynx: Oropharynx is clear.  Eyes:     Extraocular Movements: Extraocular movements intact.  Lymphadenopathy:     Cervical: No cervical adenopathy.  Skin:    Findings: Erythema, lesion and rash present.          Comments: Vesicular grouped rash to the left superior medial forehead.  Some lesions already crusting others still blisterlike.  Neurological:     Mental Status: She is alert.    No results found for any visits on 09/24/21.      Assessment & Plan:    Problem List Items Addressed This Visit       Other   Herpes zoster without complication - Primary    Hospice provider already wrote valacyclovir for patient.  Patient's husband Patient with ophthalmology today and states no corneal involvement.  Will write gabapentin 100 mg 1 capsule to 2 capsules daily as needed for nerve pain.  Currently patient not complaining of any pain but has been requested prescription to have on hand just in case she wakes up at night with pain.  We will have patient follow-up in 1 week for reevaluation       Relevant Medications   valACYclovir (VALTREX) 1000 MG tablet   gabapentin (NEURONTIN) 100 MG capsule    Meds ordered this encounter  Medications   gabapentin (NEURONTIN) 100 MG capsule    Sig: Start with one capsule a day if needed. May increase to twice a day if needed    Dispense:  30 capsule    Refill:  0    Order Specific Question:   Supervising Provider    Answer:   Loura Pardon A [[id number]]    Return in about 1 week (around 10/01/2021) for  shingles recheck.  Romilda Garret, NP

## 2021-09-24 NOTE — Assessment & Plan Note (Signed)
Hospice provider already wrote valacyclovir for patient.  Patient's husband Patient with ophthalmology today and states no corneal involvement.  Will write gabapentin 100 mg 1 capsule to 2 capsules daily as needed for nerve pain.  Currently patient not complaining of any pain but has been requested prescription to have on hand just in case she wakes up at night with pain.  We will have patient follow-up in 1 week for reevaluation

## 2021-09-24 NOTE — Patient Instructions (Signed)
Nice to see you today Follow up in one week to make sure we are healing well

## 2021-09-27 ENCOUNTER — Encounter: Payer: Self-pay | Admitting: Nurse Practitioner

## 2021-09-27 ENCOUNTER — Encounter: Payer: Self-pay | Admitting: Family Medicine

## 2021-09-27 ENCOUNTER — Ambulatory Visit (INDEPENDENT_AMBULATORY_CARE_PROVIDER_SITE_OTHER): Admitting: Family Medicine

## 2021-09-27 VITALS — BP 116/74 | HR 110 | Temp 97.9°F | Ht 63.0 in | Wt 136.0 lb

## 2021-09-27 DIAGNOSIS — B029 Zoster without complications: Secondary | ICD-10-CM | POA: Diagnosis not present

## 2021-09-27 NOTE — Telephone Encounter (Signed)
I spoke with pts husband (DPR signed) pt taking valcyclovir for shingles; the swelling is better around the eye but starting swelling on lt side of face this morning. No new blisters on lt side of face; swelling especially at lower jaw on lt side. Scheduled pt an appt 09/27/21 at 12 noon with DR Glori Bickers.Dr Glori Bickers is aware and sending note to Dr Glori Bickers.

## 2021-09-27 NOTE — Assessment & Plan Note (Addendum)
Rash and eye swelling is improved and no c/o pain or change in vision  New swelling of L jaw today  Suspect this is from gravity pulling down on fluid around eye  Very reassuring exam and pt has no c/o No signs of bact superinfection  Will continue to watch Discussed care of rash with soap/water  Plan to finish valcyclovir

## 2021-09-27 NOTE — Patient Instructions (Signed)
Finish the valcyclovir  Take the gabapentin if needed   Keep skin clean with soap and water   If any increase in redness or any drainage let us know  If any vision change let us know  Let's watch the swelling and follow up as planned

## 2021-09-27 NOTE — Progress Notes (Signed)
Subjective:    Patient ID: Christy Kelley, female    DOB: 1950-09-25, 71 y.o.   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 presents for f/u of shingles on face   Wt Readings from Last 3 Encounters:  09/27/21 136 lb (61.7 kg)  09/24/21 138 lb (62.6 kg)  08/22/21 140 lb 7 oz (63.7 kg)   24.09 kg/m   Taking valcyclovir  since Sunday  Also px gabapentin (has not needed yet)  Had seen oph-no corneal involvement  Now having swelling around eye is better  Now some swelling in her jaw-this is new /no lesions in that area   Feels pretty good  Pain is better   Eye feels fine  Oph gave her abx oint for eye to be safe  No corneal inv   Has lung cancer  In hospice/ late/extensive with mets   Patient Active Problem List   Diagnosis Date Noted   Herpes zoster without complication 47/82/9562   Malignant neoplasm metastatic to brain ([place]) 05/29/2021   Chemotherapy induced neutropenia ([place]) 02/20/2021   SCLC (small cell lung carcinoma), right ([place]) 02/07/2021   Encounter for antineoplastic immunotherapy 02/07/2021   Small cell carcinoma of right lung ([place]) 01/24/2021   Encounter for antineoplastic chemotherapy 01/24/2021   Acute respiratory failure with hypoxia ([place]) 01/17/2021   Lung mass 01/17/2021   Hyponatremia 01/17/2021   Elevated troponin 01/17/2021   Hyperglycemia 01/17/2021   Brain fog 11/23/2020   Murmur, cardiac 11/23/2020   Breast density 11/23/2020   CAP (community acquired pneumonia) 08/02/2020   Chronic pain of left knee 12/23/2018   Family history of rheumatoid arthritis 12/23/2018   Past Medical History:  Diagnosis Date   Arthritis    Lung cancer ([place])    Dx 9/27 with lung cancer   Past Surgical History:  Procedure Laterality Date   BRONCHIAL WASHINGS  01/18/2021   Procedure: BRONCHIAL WASHINGS;  Surgeon: Julian Hy, DO;  [facility];  Service: Endoscopy;;   ENDOBRONCHIAL ULTRASOUND N/A 01/18/2021   Procedure: ENDOBRONCHIAL ULTRASOUND;   Surgeon: Julian Hy, DO;  [facility];  Service: Endoscopy;  Laterality: N/A;   FINE NEEDLE ASPIRATION  01/18/2021   Procedure: FINE NEEDLE ASPIRATION (FNA) LINEAR;  Surgeon: Julian Hy, DO;  [facility];  Service: Endoscopy;;   VIDEO BRONCHOSCOPY N/A 01/18/2021   Procedure: VIDEO BRONCHOSCOPY WITHOUT FLUORO;  Surgeon: Julian Hy, DO;  [facility];  Service: Endoscopy;  Laterality: N/A;   Social History   Tobacco Use   Smoking status: Former    Packs/day: 0.25    Years: 35.00    Total pack years: 8.75    Types: Cigarettes    Quit date: 08/29/2020    Years since quitting: 1.0   Smokeless tobacco: Never  Vaping Use   Vaping Use: Never used  Substance Use Topics   Alcohol use: Not Currently    Comment: Beer 6 daily. Recenlty went to 2-3 dialy   Drug use: Never   Family History  Problem Relation Age of Onset   Arthritis/Rheumatoid Mother    Diabetes Father    Hodgkin's lymphoma Father    Colon cancer Neg Hx    Colon polyps Neg Hx    Esophageal cancer Neg Hx    Rectal cancer Neg Hx    Stomach cancer Neg Hx    No Known Allergies Current Outpatient Medications on File Prior to Visit  Medication Sig Dispense Refill   Ascorbic Acid (VITAMIN  C PO) Take 1,000 mg by mouth daily.     calcium carbonate (OSCAL) 1500 (600 Ca) MG TABS tablet Take by mouth daily with breakfast.     gabapentin (NEURONTIN) 100 MG capsule Start with one capsule a day if needed. May increase to twice a day if needed 30 capsule 0   loperamide (IMODIUM) 2 MG capsule Take by mouth.     LORazepam (ATIVAN) 0.5 MG tablet 1 tab po 30 minutes prior to radiation or MRI scans 30 tablet 0   Melatonin 5 MG CAPS Take 5 mg by mouth at bedtime.     meloxicam (MOBIC) 15 MG tablet Take 1 tablet (15 mg total) by mouth daily. 30 tablet 0   Multiple Vitamin (MULTIVITAMIN) tablet Take 1 tablet by mouth daily.     OVER THE COUNTER MEDICATION Balance of Nature supplement     temazepam  (RESTORIL) 15 MG capsule Take 1 capsule (15 mg total) by mouth at bedtime as needed for sleep. 30 capsule 0   valACYclovir (VALTREX) 1000 MG tablet Take 1,000 mg by mouth 3 (three) times daily.     Vitamin D, Cholecalciferol, 25 MCG (1000 UT) TABS Take 1,000 Units by mouth daily.     No current facility-administered medications on file prior to visit.     Review of Systems  Constitutional:  Positive for fatigue. Negative for activity change, appetite change, fever and unexpected weight change.  HENT:  Negative for congestion, ear pain, rhinorrhea, sinus pressure and sore throat.   Eyes:  Negative for pain, redness and visual disturbance.  Respiratory:  Negative for cough, shortness of breath and wheezing.        Baseline sob  Cardiovascular:  Negative for chest pain and palpitations.  Gastrointestinal:  Negative for abdominal pain, blood in stool, constipation and diarrhea.  Endocrine: Negative for polydipsia and polyuria.  Genitourinary:  Negative for dysuria, frequency and urgency.  Musculoskeletal:  Negative for arthralgias, back pain and myalgias.  Skin:  Positive for rash. Negative for pallor.  Allergic/Immunologic: Negative for environmental allergies.  Neurological:  Negative for dizziness, syncope and headaches.  Hematological:  Negative for adenopathy. Does not bruise/bleed easily.  Psychiatric/Behavioral:  Negative for decreased concentration and dysphoric mood. The patient is not nervous/anxious.        Objective:   Physical Exam Constitutional:      General: She is not in acute distress.    Appearance: She is normal weight. She is not ill-appearing or diaphoretic.     Comments: Frail appearing   HENT:     Head: Normocephalic and atraumatic.     Comments: Mild swelling of L mandible area/soft and nt without skin change No bony abn or tenderness  Swelling around eye is noted/per pt and family is improved     Right Ear: Tympanic membrane and ear canal normal.     Left  Ear: Tympanic membrane and ear canal normal.     Mouth/Throat:     Mouth: Mucous membranes are moist.  Eyes:     General: No scleral icterus.       Right eye: No discharge.        Left eye: No discharge.     Extraocular Movements: Extraocular movements intact.     Conjunctiva/sclera: Conjunctivae normal.     Pupils: Pupils are equal, round, and reactive to light.  Cardiovascular:     Rate and Rhythm: Regular rhythm. Tachycardia present.     Heart sounds: Normal heart sounds.  Pulmonary:  Effort: No respiratory distress.     Breath sounds: No wheezing.  Musculoskeletal:     Cervical back: Normal range of motion and neck supple.  Lymphadenopathy:     Cervical: No cervical adenopathy.  Skin:    Findings: Rash present. No bruising.     Comments: Zoster rash on L scalp and forehead is starting to scab over Not tender No signs of bacterial infection     Neurological:     Mental Status: She is alert.     Cranial Nerves: No cranial nerve deficit.  Psychiatric:        Mood and Affect: Mood normal.           Assessment & Plan:   Problem List Items Addressed This Visit       Other   Herpes zoster without complication - Primary    Rash and eye swelling is improved and no c/o pain or change in vision  New swelling of L jaw today  Suspect this is from gravity pulling down on fluid around eye  Very reassuring exam and pt has no c/o No signs of bact superinfection  Will continue to watch Discussed care of rash with soap/water  Plan to finish valcyclovir

## 2021-10-02 ENCOUNTER — Ambulatory Visit (INDEPENDENT_AMBULATORY_CARE_PROVIDER_SITE_OTHER): Payer: Medicare HMO | Admitting: Nurse Practitioner

## 2021-10-02 ENCOUNTER — Encounter: Payer: Self-pay | Admitting: Nurse Practitioner

## 2021-10-02 VITALS — BP 100/60 | HR 92 | Temp 97.6°F | Ht 63.0 in | Wt 134.1 lb

## 2021-10-02 DIAGNOSIS — B029 Zoster without complications: Secondary | ICD-10-CM | POA: Diagnosis not present

## 2021-10-02 NOTE — Assessment & Plan Note (Signed)
Patient here for follow-up.  Rash seems to be resolving.  Periorbital edema and left lower jaw edema resolved.  Patient has finished valacyclovir antiviral medication.  Has not needed to use gabapentin for pain.  Did discuss with patient and patient's spouse about discarding medication.  Patient is currently under hospice care they may have adequate ability to discard medications.  Follow-up with me as needed we will continue to be attending provider for hospice orders

## 2021-10-02 NOTE — Progress Notes (Signed)
   Established Patient Office Visit  Subjective   Patient ID: Christy Kelley, female    DOB: 03-31-1951  Age: 71 y.o. MRN: [id number]  Chief Complaint  Patient presents with   Follow-up    On shingles    HPI  Patient here for shingles follow up. First noticed by hospice worker and was told to see PCP. Patient saw me in office on 09/24/2021. She was already started on an antiviral medication. I sent in medications for pain if needed. Stated they were evaluated by a colleague for lower jaw swelling. She is here today for a follow up on rash.  No complaints today. She has finished the antiviral medication on Sunday and has not needed the gabapentin for pain. She is accompanied by her husband.   Did discuss with patient and patient's spouse about patient's advanced directives.  Do feel like patient was made DNR when going to the hospital service but we do not have it on file.  Patient's spouse states that she is a DNR.  Did ask for him to bring [place] a copy for our records.  This is noted in hospice order scanned in on 09/06/2021 on page 2    Review of Systems  Constitutional:  Negative for chills and fever.  Skin:  Positive for rash. Negative for itching.  Neurological:  Negative for headaches.      Objective:     BP 100/60   Pulse 92   Temp 97.6 F (36.4 C) (Skin)   Ht 5\' 3"  (1.6 m)   Wt 134 lb 2 oz (60.8 kg)   LMP  (LMP Unknown)   SpO2 100%   BMI 23.76 kg/m    Physical Exam Vitals and nursing note reviewed.  Constitutional:      Appearance: Normal appearance.  HENT:     Mouth/Throat:     Mouth: Mucous membranes are moist.  Eyes:     Pupils: Pupils are equal, round, and reactive to light.  Cardiovascular:     Rate and Rhythm: Normal rate and regular rhythm.     Heart sounds: Normal heart sounds.  Pulmonary:     Breath sounds: Normal breath sounds.  Lymphadenopathy:     Cervical: No cervical adenopathy.  Skin:    Findings: Rash present.          Comments: Dried  crusted shingles lesions to the left forehead.  Neurological:     Mental Status: She is alert.      No results found for any visits on 10/02/21.    The 10-year ASCVD risk score (Arnett DK, et al., 2019) is: 4.6%    Assessment & Plan:   Problem List Items Addressed This Visit       Other   Herpes zoster without complication - Primary    Patient here for follow-up.  Rash seems to be resolving.  Periorbital edema and left lower jaw edema resolved.  Patient has finished valacyclovir antiviral medication.  Has not needed to use gabapentin for pain.  Did discuss with patient and patient's spouse about discarding medication.  Patient is currently under hospice care they may have adequate ability to discard medications.  Follow-up with me as needed we will continue to be attending provider for hospice orders       Return if symptoms worsen or fail to improve.    Romilda Garret, NP

## 2021-10-02 NOTE — Patient Instructions (Signed)
Nice to see you today Reach out if you need anything Follow up with me as needed

## 2021-11-13 ENCOUNTER — Telehealth: Payer: Self-pay | Admitting: Nurse Practitioner

## 2021-11-13 NOTE — Telephone Encounter (Signed)
Spoke to patient spouse  He stated patient could not do awv.

## 2021-11-15 ENCOUNTER — Encounter: Payer: Self-pay | Admitting: Nurse Practitioner

## 2021-11-15 MED ORDER — HYOSCYAMINE SULFATE 0.125 MG/ML PO SOLN
0.1250 mg | ORAL | 0 refills | Status: AC
Start: 2021-11-15 — End: ?

## 2021-11-15 NOTE — Telephone Encounter (Signed)
Spoke with Gerald Stabs, patient's husband, and see mychart message. I called [facility] and then [facility]. Recommended to do Hyosyne drops 0.125 mg/ml-give 1 ml every 4 hours (per Gerald Stabs patient has been taking it every 4 hours daily). They have bottles of 15 ml so they can not break those so it has to be like 15 or 30 or 35 and etc.  [facility] can not compound Hyoscamine medication because it is commercially available. Ok to send in drops? I pulled the order down for review.  Gerald Stabs said he is just trying to keep patient comfortable. Patient maybe has 2 weeks to live.

## 2021-11-15 NOTE — Addendum Note (Signed)
Addended by: Michela Pitcher on: 11/15/2021 05:05 PM   Modules accepted: Orders

## 2021-11-15 NOTE — Telephone Encounter (Signed)
Can we call and see if she has been doing the sublingual tablets? IF not that is fine. If so can we call the pharmacy and see if they have sublingual tab lets can they melt it with water and then use it?

## 2021-11-15 NOTE — Addendum Note (Signed)
Addended by: Kris Mouton on: 11/15/2021 04:22 PM   Modules accepted: Orders

## 2021-11-16 ENCOUNTER — Telehealth: Payer: Self-pay

## 2021-11-16 NOTE — Telephone Encounter (Signed)
Per mychart patient received this medication from the pharmacy today.

## 2021-11-16 NOTE — Telephone Encounter (Signed)
Key: [place]

## 2021-11-16 NOTE — Telephone Encounter (Signed)
PA for Hyoscamine liquid submitted via covermymeds. If this is not approved it will be ran as out of pocket and should be around $40 or less.

## 2021-11-26 ENCOUNTER — Telehealth: Payer: Self-pay

## 2021-11-26 NOTE — Telephone Encounter (Signed)
[employer] called wanted to let you know they sent over death certificate.

## 2021-11-27 NOTE — Telephone Encounter (Signed)
[facility] called regarding Death Certificate requesting a call back # [phone]

## 2021-11-27 NOTE — Telephone Encounter (Signed)
Amy is working on this with [employer] [employer] and [facility] [place] is aware.

## 2021-11-28 NOTE — Telephone Encounter (Signed)
Spoke with my supervising physician and she is agreeable to sign the death certificate since there is been trouble with the [place] [facility] system. Called funeral home and spoke to [place] and she sent the certificate to Dr. Glori Bickers.

## 2021-12-11 ENCOUNTER — Other Ambulatory Visit: Payer: Self-pay | Admitting: Nurse Practitioner

## 2021-12-11 DIAGNOSIS — G8929 Other chronic pain: Secondary | ICD-10-CM

## 2021-12-21 DEATH — deceased

## 2023-02-20 IMAGING — CT CT ANGIO CHEST
2 of 6 series · 18 of 36 positions shown · IV contrast (omnipaque)
Comparison: Chest x-ray 01/16/2021, CT chest 07/27/2020

CLINICAL DATA: Shortness of breath and wheezing dry cough

EXAM:
CT ANGIOGRAPHY CHEST WITH CONTRAST
TECHNIQUE: Multidetector CT imaging of the chest was performed using the
standard protocol during bolus administration of intravenous
contrast. Multiplanar CT image reconstructions and MIPs were
obtained to evaluate the vascular anatomy.
CONTRAST:  80mL OMNIPAQUE IOHEXOL 350 MG/ML SOLN

[Series 5: thins · axial · 0.71mm/px · z∈[-214,+39]mm · 17 of 285 slices shown]
[im 16/285  lung]
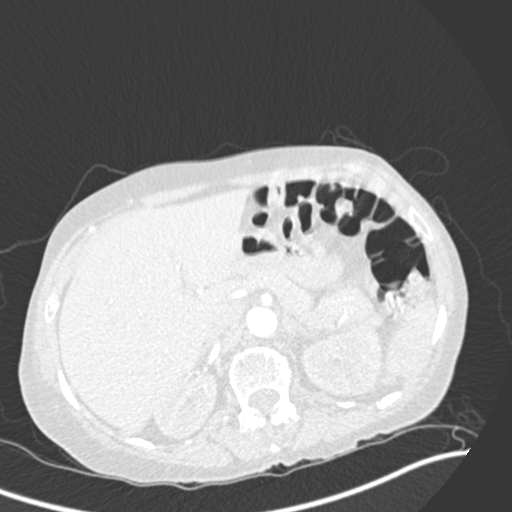
[im 32/285  mediastinal]
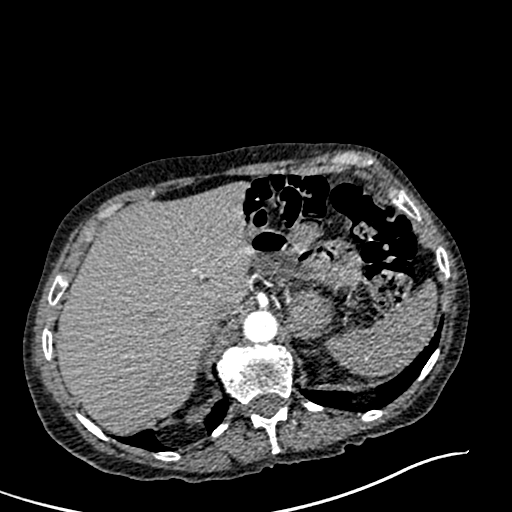
[im 48/285  lung]
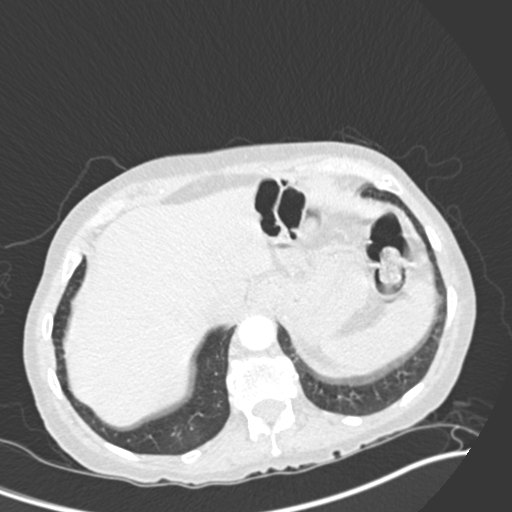
[im 64/285  mediastinal]
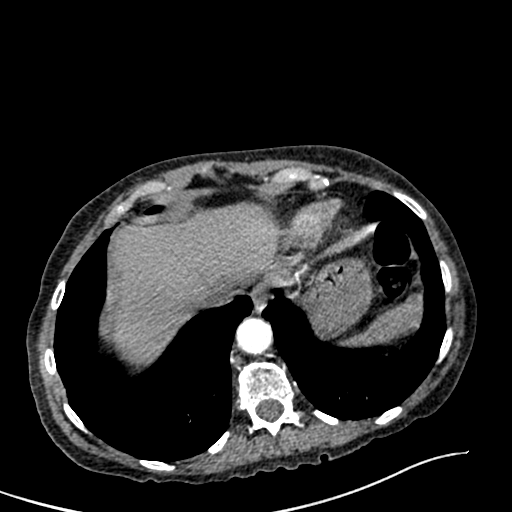
[im 79/285  lung]
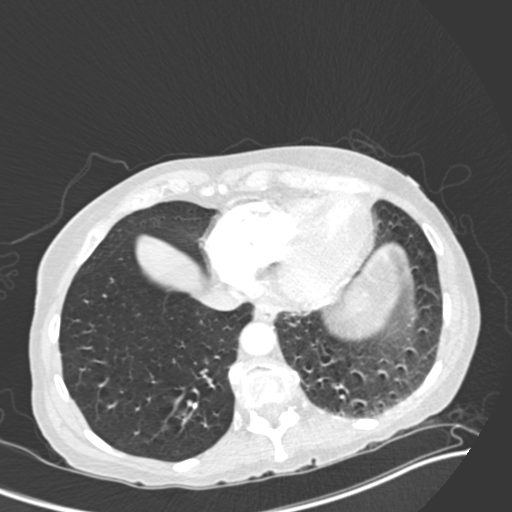
[im 95/285  mediastinal]
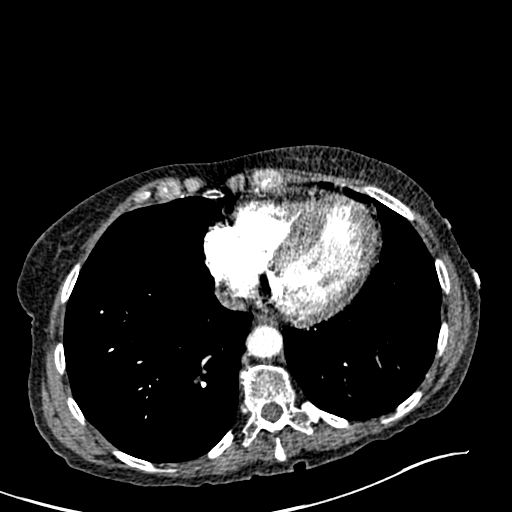
[im 111/285  lung]
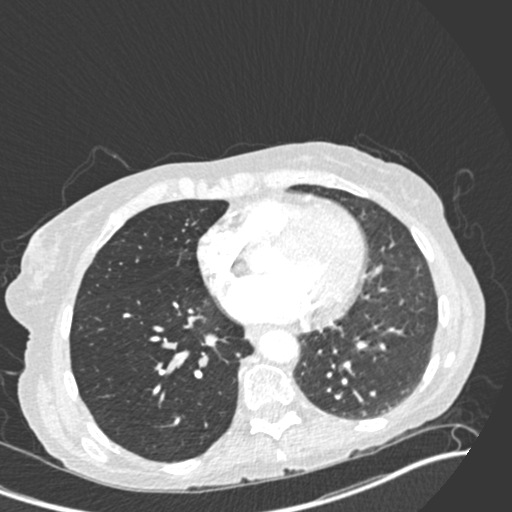
[im 127/285  mediastinal]
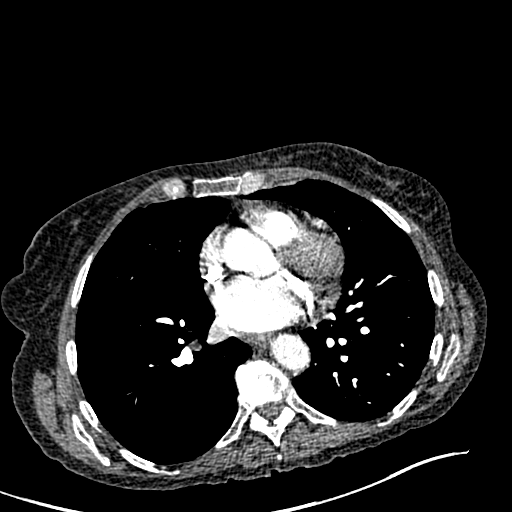
[im 143/285  lung]
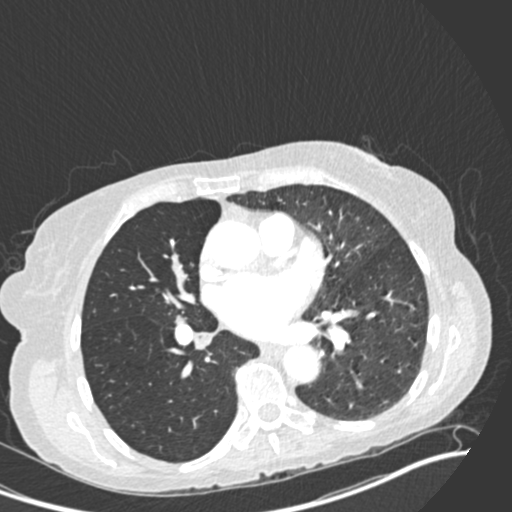
[im 158/285  mediastinal]
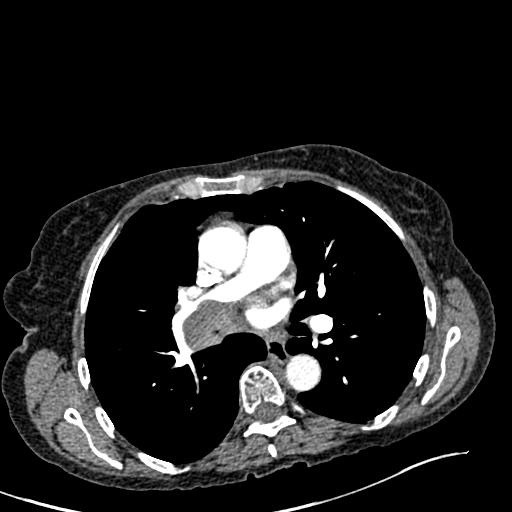
[im 174/285  lung]
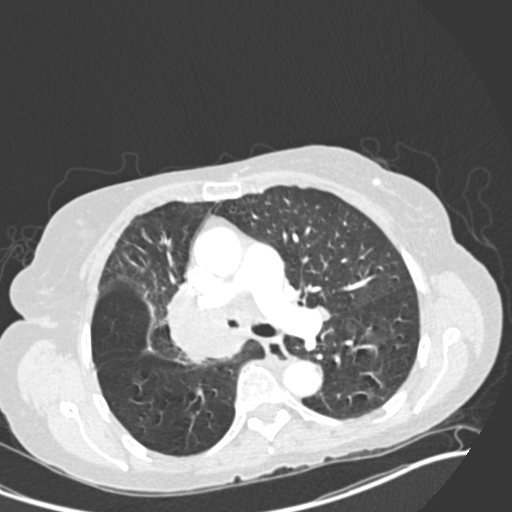
[im 190/285  mediastinal]
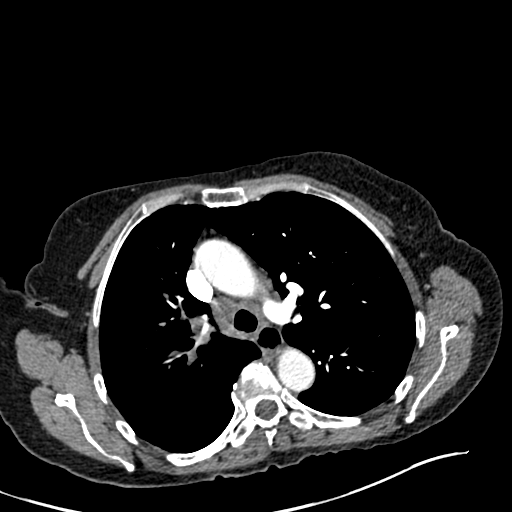
[im 206/285  lung]
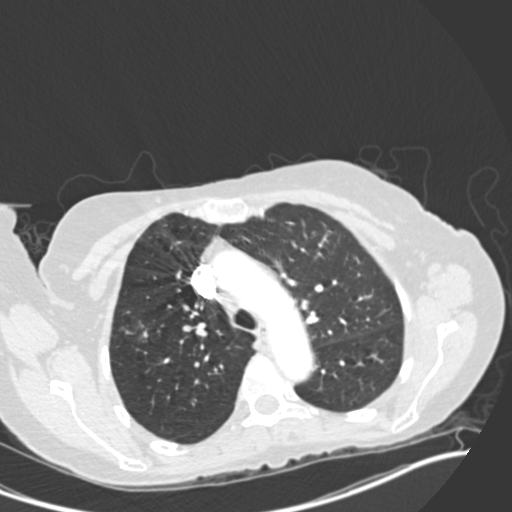
[im 221/285  mediastinal]
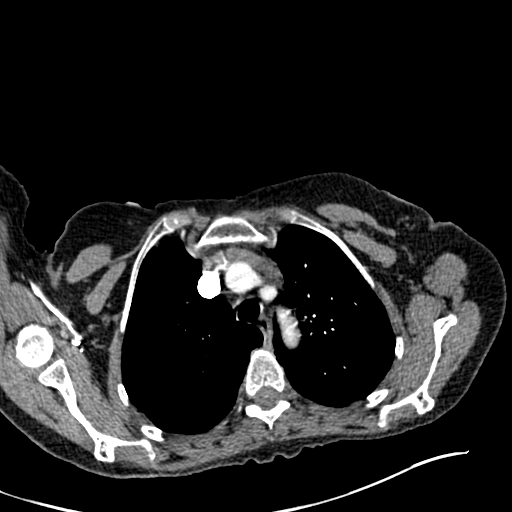
[im 237/285  lung]
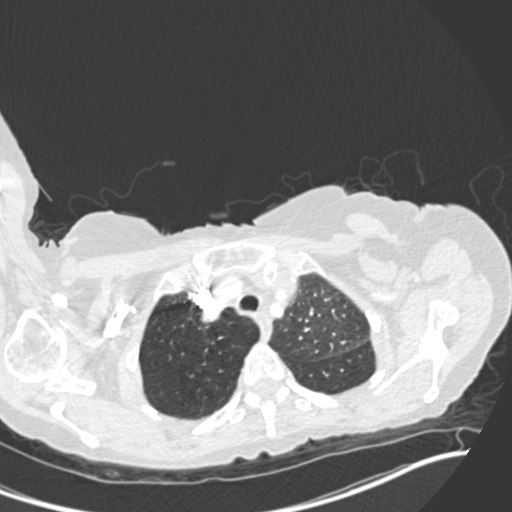
[im 253/285  mediastinal]
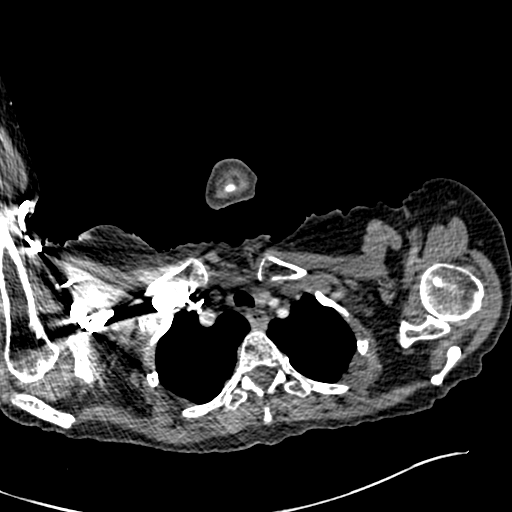
[im 269/285  lung]
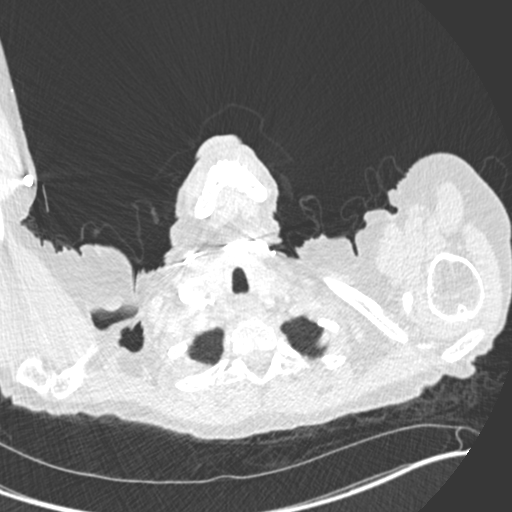

[Series 6: coronal mpr · coronal · 0.57mm/px · 1 of 151 slices shown]
[im 76/151  mediastinal]
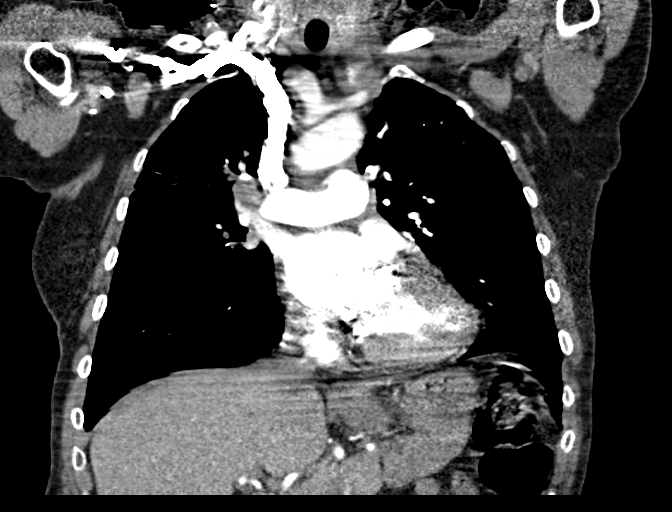

[18 of 36 positions shown; findings below may reference images not displayed]

FINDINGS: Cardiovascular: Satisfactory opacification of the pulmonary arteries
to the segmental level. No evidence of pulmonary embolism. Narrowing
of right upper lobe and distal right pulmonary artery by lobulated
soft tissue mass. Mild aortic atherosclerosis. No aneurysm. Normal
cardiac size. No pericardial effusion. Coronary calcifications and
mitral calcifications.

Mediastinum/Nodes: Midline trachea. No thyroid mass. Right
paratracheal node measures 16 mm. Precarinal lymph node measures 15
mm. Bulky right hilar mass measuring 4.7 by 4.2 by 3.7 cm
approximately. Occlusion of the right main bronchus and proximal
right lower lobe and middle lobe bronchi 6 with fluid or debris in
the right middle and lower lobe bronchi.

Lungs/Pleura: Hyperlucent right upper, middle and lower lobes.
Largely cleared right upper lobe pneumonia. Peripheral bandlike
density in the right upper lobe most likely scarring.

Upper Abdomen: Subcentimeter hypodensity in the left hepatic lobe.
No acute abnormality.

Musculoskeletal: No acute or suspicious osseous abnormality.

Review of the MIP images confirms the above findings.
IMPRESSION: 1. Negative for acute pulmonary embolism.
2. Interval finding of bulky right hilar mass/neoplasm with
narrowing of the distal right pulmonary artery and right upper lobe
pulmonary vessels. Occlusion of distal right main bronchus and right
lower and middle lobe bronchi by mass lesion. Fluid and or debris
within distal right lower and middle lobe bronchi. There are
multiple enlarged mediastinal lymph nodes.
3. Largely cleared right upper lobe pneumonia with peripheral
bandlike density in the right upper lobe likely scarring.

Aortic Atherosclerosis ([id number]).
